# Patient Record
Sex: Female | Born: 1990 | Race: Black or African American | Hispanic: No | Marital: Single | Smoking: Former smoker
Health system: Southern US, Community
[De-identification: ages and names within clinical notes are randomized; demographics above are authoritative.]

## PROBLEM LIST (undated history)

## (undated) ENCOUNTER — Inpatient Hospital Stay (HOSPITAL_COMMUNITY): Payer: Self-pay

## (undated) ENCOUNTER — Inpatient Hospital Stay: Payer: Self-pay

## (undated) DIAGNOSIS — F329 Major depressive disorder, single episode, unspecified: Secondary | ICD-10-CM

## (undated) DIAGNOSIS — L309 Dermatitis, unspecified: Secondary | ICD-10-CM

## (undated) DIAGNOSIS — F32A Depression, unspecified: Secondary | ICD-10-CM

## (undated) DIAGNOSIS — F419 Anxiety disorder, unspecified: Secondary | ICD-10-CM

## (undated) DIAGNOSIS — J45909 Unspecified asthma, uncomplicated: Secondary | ICD-10-CM

## (undated) DIAGNOSIS — B977 Papillomavirus as the cause of diseases classified elsewhere: Secondary | ICD-10-CM

## (undated) HISTORY — DX: Papillomavirus as the cause of diseases classified elsewhere: B97.7

## (undated) HISTORY — PX: NO PAST SURGERIES: SHX2092

---
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
## 2008-02-17 ENCOUNTER — Emergency Department (HOSPITAL_COMMUNITY): Admission: EM | Admit: 2008-02-17 | Discharge: 2008-02-17 | Payer: Self-pay | Admitting: Emergency Medicine

## 2008-12-05 ENCOUNTER — Emergency Department (HOSPITAL_COMMUNITY): Admission: EM | Admit: 2008-12-05 | Discharge: 2008-12-05 | Payer: Self-pay | Admitting: Emergency Medicine

## 2009-01-20 ENCOUNTER — Emergency Department (HOSPITAL_COMMUNITY): Admission: EM | Admit: 2009-01-20 | Discharge: 2009-01-20 | Payer: Self-pay | Admitting: Pulmonary Disease

## 2009-03-10 ENCOUNTER — Ambulatory Visit: Payer: Self-pay | Admitting: Obstetrics & Gynecology

## 2009-03-15 ENCOUNTER — Ambulatory Visit: Payer: Self-pay | Admitting: Physician Assistant

## 2009-03-15 ENCOUNTER — Inpatient Hospital Stay (HOSPITAL_COMMUNITY): Admission: AD | Admit: 2009-03-15 | Discharge: 2009-03-15 | Payer: Self-pay | Admitting: Obstetrics & Gynecology

## 2009-03-17 ENCOUNTER — Ambulatory Visit: Payer: Self-pay | Admitting: Family Medicine

## 2009-03-17 ENCOUNTER — Ambulatory Visit (HOSPITAL_COMMUNITY): Admission: RE | Admit: 2009-03-17 | Discharge: 2009-03-17 | Payer: Self-pay | Admitting: Family Medicine

## 2009-03-31 ENCOUNTER — Ambulatory Visit: Payer: Self-pay | Admitting: Obstetrics and Gynecology

## 2009-04-21 ENCOUNTER — Ambulatory Visit: Payer: Self-pay | Admitting: Obstetrics & Gynecology

## 2009-04-21 ENCOUNTER — Ambulatory Visit (HOSPITAL_COMMUNITY): Admission: RE | Admit: 2009-04-21 | Discharge: 2009-04-21 | Payer: Self-pay | Admitting: Family Medicine

## 2009-04-28 ENCOUNTER — Ambulatory Visit: Payer: Self-pay | Admitting: Obstetrics & Gynecology

## 2009-04-29 ENCOUNTER — Ambulatory Visit (HOSPITAL_COMMUNITY): Admission: RE | Admit: 2009-04-29 | Discharge: 2009-04-29 | Payer: Self-pay | Admitting: Obstetrics and Gynecology

## 2009-05-05 ENCOUNTER — Ambulatory Visit: Payer: Self-pay | Admitting: Obstetrics and Gynecology

## 2009-05-12 ENCOUNTER — Ambulatory Visit: Payer: Self-pay | Admitting: Obstetrics & Gynecology

## 2009-05-20 ENCOUNTER — Ambulatory Visit: Payer: Self-pay | Admitting: Obstetrics & Gynecology

## 2009-05-26 ENCOUNTER — Encounter: Payer: Self-pay | Admitting: Obstetrics & Gynecology

## 2009-05-26 ENCOUNTER — Ambulatory Visit: Payer: Self-pay | Admitting: Obstetrics & Gynecology

## 2009-06-02 ENCOUNTER — Ambulatory Visit: Payer: Self-pay | Admitting: Obstetrics & Gynecology

## 2009-06-08 ENCOUNTER — Ambulatory Visit: Payer: Self-pay | Admitting: Obstetrics & Gynecology

## 2009-06-14 ENCOUNTER — Ambulatory Visit (HOSPITAL_COMMUNITY): Admission: RE | Admit: 2009-06-14 | Discharge: 2009-06-14 | Payer: Self-pay | Admitting: Obstetrics and Gynecology

## 2009-06-15 ENCOUNTER — Ambulatory Visit: Payer: Self-pay | Admitting: Obstetrics and Gynecology

## 2009-06-23 ENCOUNTER — Ambulatory Visit: Payer: Self-pay | Admitting: Family Medicine

## 2009-06-30 ENCOUNTER — Ambulatory Visit: Payer: Self-pay | Admitting: Obstetrics & Gynecology

## 2009-07-07 ENCOUNTER — Ambulatory Visit: Payer: Self-pay | Admitting: Obstetrics & Gynecology

## 2009-07-07 ENCOUNTER — Encounter (INDEPENDENT_AMBULATORY_CARE_PROVIDER_SITE_OTHER): Payer: Self-pay | Admitting: *Deleted

## 2009-07-08 LAB — CONVERTED CEMR LAB
MCHC: 34.1 g/dL (ref 30.0–36.0)
RBC: 3.45 M/uL — ABNORMAL LOW (ref 3.87–5.11)
RDW: 13.6 % (ref 11.5–15.5)

## 2009-07-14 ENCOUNTER — Ambulatory Visit: Payer: Self-pay | Admitting: Obstetrics & Gynecology

## 2009-07-21 ENCOUNTER — Ambulatory Visit: Payer: Self-pay | Admitting: Obstetrics & Gynecology

## 2009-07-28 ENCOUNTER — Ambulatory Visit: Payer: Self-pay | Admitting: Family Medicine

## 2009-08-04 ENCOUNTER — Ambulatory Visit: Payer: Self-pay | Admitting: Obstetrics & Gynecology

## 2009-08-11 ENCOUNTER — Ambulatory Visit: Payer: Self-pay | Admitting: Obstetrics & Gynecology

## 2009-08-18 ENCOUNTER — Ambulatory Visit: Payer: Self-pay | Admitting: Vascular Surgery

## 2009-08-18 ENCOUNTER — Ambulatory Visit: Admission: RE | Admit: 2009-08-18 | Discharge: 2009-08-18 | Payer: Self-pay | Admitting: Obstetrics and Gynecology

## 2009-08-18 ENCOUNTER — Encounter (INDEPENDENT_AMBULATORY_CARE_PROVIDER_SITE_OTHER): Payer: Self-pay | Admitting: *Deleted

## 2009-08-18 ENCOUNTER — Ambulatory Visit: Payer: Self-pay | Admitting: Obstetrics and Gynecology

## 2009-08-18 ENCOUNTER — Encounter: Payer: Self-pay | Admitting: Obstetrics and Gynecology

## 2009-08-25 ENCOUNTER — Encounter (INDEPENDENT_AMBULATORY_CARE_PROVIDER_SITE_OTHER): Payer: Self-pay | Admitting: *Deleted

## 2009-08-25 ENCOUNTER — Ambulatory Visit: Payer: Self-pay | Admitting: Family Medicine

## 2009-09-01 ENCOUNTER — Ambulatory Visit: Payer: Self-pay | Admitting: Family Medicine

## 2009-09-06 ENCOUNTER — Ambulatory Visit: Payer: Self-pay | Admitting: Advanced Practice Midwife

## 2009-09-06 ENCOUNTER — Inpatient Hospital Stay (HOSPITAL_COMMUNITY): Admission: AD | Admit: 2009-09-06 | Discharge: 2009-09-06 | Payer: Self-pay | Admitting: Obstetrics and Gynecology

## 2009-09-08 ENCOUNTER — Ambulatory Visit: Payer: Self-pay | Admitting: Obstetrics & Gynecology

## 2009-09-08 ENCOUNTER — Encounter (INDEPENDENT_AMBULATORY_CARE_PROVIDER_SITE_OTHER): Payer: Self-pay | Admitting: *Deleted

## 2009-09-11 ENCOUNTER — Ambulatory Visit: Payer: Self-pay | Admitting: Family Medicine

## 2009-09-11 ENCOUNTER — Inpatient Hospital Stay (HOSPITAL_COMMUNITY): Admission: AD | Admit: 2009-09-11 | Discharge: 2009-09-11 | Payer: Self-pay | Admitting: Obstetrics & Gynecology

## 2009-09-13 ENCOUNTER — Inpatient Hospital Stay (HOSPITAL_COMMUNITY): Admission: AD | Admit: 2009-09-13 | Discharge: 2009-09-13 | Payer: Self-pay | Admitting: Obstetrics & Gynecology

## 2009-09-13 ENCOUNTER — Ambulatory Visit: Payer: Self-pay | Admitting: Advanced Practice Midwife

## 2009-09-15 ENCOUNTER — Encounter (INDEPENDENT_AMBULATORY_CARE_PROVIDER_SITE_OTHER): Payer: Self-pay | Admitting: *Deleted

## 2009-09-15 ENCOUNTER — Ambulatory Visit: Payer: Self-pay | Admitting: Obstetrics and Gynecology

## 2009-09-17 ENCOUNTER — Ambulatory Visit: Payer: Self-pay | Admitting: Advanced Practice Midwife

## 2009-10-24 ENCOUNTER — Ambulatory Visit: Payer: Self-pay | Admitting: Obstetrics and Gynecology

## 2010-01-19 ENCOUNTER — Inpatient Hospital Stay (HOSPITAL_COMMUNITY): Admission: AD | Admit: 2010-01-19 | Discharge: 2009-09-19 | Payer: Self-pay | Admitting: Obstetrics & Gynecology

## 2010-03-23 ENCOUNTER — Ambulatory Visit: Payer: Self-pay | Admitting: Family Medicine

## 2010-04-28 LAB — CBC
MCH: 32.5 pg (ref 26.0–34.0)
MCHC: 33.2 g/dL (ref 30.0–36.0)
Platelets: 226 10*3/uL (ref 150–400)
RBC: 2.77 MIL/uL — ABNORMAL LOW (ref 3.87–5.11)
RDW: 14.6 % (ref 11.5–15.5)

## 2010-04-28 LAB — POCT URINALYSIS DIPSTICK
Bilirubin Urine: NEGATIVE
Glucose, UA: NEGATIVE mg/dL
Nitrite: NEGATIVE
Protein, ur: NEGATIVE mg/dL
Urobilinogen, UA: 1 mg/dL (ref 0.0–1.0)

## 2010-04-29 LAB — POCT URINALYSIS DIP (DEVICE)
Bilirubin Urine: NEGATIVE
Bilirubin Urine: NEGATIVE
Glucose, UA: NEGATIVE mg/dL
Glucose, UA: NEGATIVE mg/dL
Hgb urine dipstick: NEGATIVE
Hgb urine dipstick: NEGATIVE
Ketones, ur: NEGATIVE mg/dL
Ketones, ur: NEGATIVE mg/dL
Nitrite: NEGATIVE
Nitrite: NEGATIVE
Protein, ur: 30 mg/dL — AB
Protein, ur: NEGATIVE mg/dL
Specific Gravity, Urine: 1.02 (ref 1.005–1.030)
Specific Gravity, Urine: 1.025 (ref 1.005–1.030)
Urobilinogen, UA: 1 mg/dL (ref 0.0–1.0)
Urobilinogen, UA: 2 mg/dL — ABNORMAL HIGH (ref 0.0–1.0)
pH: 7 (ref 5.0–8.0)
pH: 7 (ref 5.0–8.0)

## 2010-04-30 LAB — POCT URINALYSIS DIP (DEVICE)
Glucose, UA: NEGATIVE mg/dL
Glucose, UA: NEGATIVE mg/dL
Hgb urine dipstick: NEGATIVE
Hgb urine dipstick: NEGATIVE
Ketones, ur: 15 mg/dL — AB
Ketones, ur: NEGATIVE mg/dL
Ketones, ur: 15 mg/dL — AB
Nitrite: NEGATIVE
Protein, ur: 30 mg/dL — AB
Protein, ur: 30 mg/dL — AB
Protein, ur: 30 mg/dL — AB
Specific Gravity, Urine: 1.02 (ref 1.005–1.030)
Specific Gravity, Urine: >=1.03 (ref 1.005–1.030)
Specific Gravity, Urine: 1.025 (ref 1.005–1.030)
Urobilinogen, UA: 1 mg/dL (ref 0.0–1.0)
Urobilinogen, UA: 2 mg/dL — ABNORMAL HIGH (ref 0.0–1.0)
Urobilinogen, UA: 1 mg/dL (ref 0.0–1.0)
pH: 6 (ref 5.0–8.0)
pH: 6.5 (ref 5.0–8.0)
pH: 6.5 (ref 5.0–8.0)

## 2010-05-01 LAB — POCT URINALYSIS DIP (DEVICE)
Bilirubin Urine: NEGATIVE
Glucose, UA: NEGATIVE mg/dL
Hgb urine dipstick: NEGATIVE
Hgb urine dipstick: NEGATIVE
Ketones, ur: NEGATIVE mg/dL
Protein, ur: 100 mg/dL — AB
Specific Gravity, Urine: >=1.03 (ref 1.005–1.030)
Specific Gravity, Urine: >=1.03 (ref 1.005–1.030)
Urobilinogen, UA: 1 mg/dL (ref 0.0–1.0)
Urobilinogen, UA: 0.2 mg/dL (ref 0.0–1.0)
pH: 6.5 (ref 5.0–8.0)

## 2010-05-02 LAB — POCT URINALYSIS DIP (DEVICE)
Bilirubin Urine: NEGATIVE
Hgb urine dipstick: NEGATIVE
Hgb urine dipstick: NEGATIVE
Ketones, ur: NEGATIVE mg/dL
Nitrite: NEGATIVE
Protein, ur: NEGATIVE mg/dL
Protein, ur: 100 mg/dL — AB
Protein, ur: 30 mg/dL — AB
Specific Gravity, Urine: >=1.03 (ref 1.005–1.030)
Specific Gravity, Urine: >=1.03 (ref 1.005–1.030)
Urobilinogen, UA: 0.2 mg/dL (ref 0.0–1.0)
Urobilinogen, UA: 1 mg/dL (ref 0.0–1.0)
pH: 7 (ref 5.0–8.0)
pH: 5.5 (ref 5.0–8.0)
pH: 6 (ref 5.0–8.0)

## 2010-05-03 LAB — URINALYSIS, ROUTINE W REFLEX MICROSCOPIC
Bilirubin Urine: NEGATIVE
Hgb urine dipstick: NEGATIVE
Ketones, ur: NEGATIVE mg/dL
Protein, ur: NEGATIVE mg/dL
Specific Gravity, Urine: >1.03 — ABNORMAL HIGH (ref 1.005–1.030)
Urobilinogen, UA: 0.2 mg/dL (ref 0.0–1.0)

## 2010-05-03 LAB — POCT URINALYSIS DIP (DEVICE)
Bilirubin Urine: NEGATIVE
Glucose, UA: NEGATIVE mg/dL
Hgb urine dipstick: NEGATIVE
Hgb urine dipstick: NEGATIVE
Ketones, ur: NEGATIVE mg/dL
Ketones, ur: NEGATIVE mg/dL
Nitrite: NEGATIVE
Nitrite: NEGATIVE
Protein, ur: 30 mg/dL — AB
Protein, ur: NEGATIVE mg/dL
Specific Gravity, Urine: 1.025 (ref 1.005–1.030)
Urobilinogen, UA: 1 mg/dL (ref 0.0–1.0)
Urobilinogen, UA: 0.2 mg/dL (ref 0.0–1.0)
pH: 6 (ref 5.0–8.0)
pH: 6 (ref 5.0–8.0)

## 2010-05-03 LAB — WET PREP, GENITAL: Trich, Wet Prep: NONE SEEN

## 2010-05-07 LAB — POCT URINALYSIS DIP (DEVICE)
Bilirubin Urine: NEGATIVE
Glucose, UA: NEGATIVE mg/dL
Hgb urine dipstick: NEGATIVE
Hgb urine dipstick: NEGATIVE
Nitrite: NEGATIVE
Nitrite: NEGATIVE
Protein, ur: 30 mg/dL — AB
Protein, ur: NEGATIVE mg/dL
Specific Gravity, Urine: 1.025 (ref 1.005–1.030)
Urobilinogen, UA: 0.2 mg/dL (ref 0.0–1.0)
Urobilinogen, UA: 1 mg/dL (ref 0.0–1.0)
pH: 7 (ref 5.0–8.0)

## 2010-05-16 LAB — CBC
HCT: 37.4 % (ref 36.0–46.0)
MCV: 95.9 fL (ref 78.0–100.0)
Platelets: 185 10*3/uL (ref 150–400)
RBC: 3.9 MIL/uL (ref 3.87–5.11)
WBC: 3.9 10*3/uL — ABNORMAL LOW (ref 4.0–10.5)

## 2010-05-16 LAB — URINE MICROSCOPIC-ADD ON

## 2010-05-16 LAB — DIFFERENTIAL
Basophils Absolute: 0 10*3/uL (ref 0.0–0.1)
Eosinophils Absolute: 0.1 10*3/uL (ref 0.0–0.7)
Lymphs Abs: 1.4 10*3/uL (ref 0.7–4.0)
Monocytes Absolute: 0.5 10*3/uL (ref 0.1–1.0)
Neutro Abs: 1.9 10*3/uL (ref 1.7–7.7)

## 2010-05-16 LAB — URINALYSIS, ROUTINE W REFLEX MICROSCOPIC
Bilirubin Urine: NEGATIVE
Hgb urine dipstick: NEGATIVE
Ketones, ur: 15 mg/dL — AB
Nitrite: NEGATIVE
Protein, ur: 30 mg/dL — AB
Urobilinogen, UA: 0.2 mg/dL (ref 0.0–1.0)

## 2010-05-16 LAB — COMPREHENSIVE METABOLIC PANEL
ALT: 14 U/L (ref 0–35)
AST: 37 U/L (ref 0–37)
Albumin: 3.6 g/dL (ref 3.5–5.2)
Alkaline Phosphatase: 34 U/L — ABNORMAL LOW (ref 39–117)
BUN: 9 mg/dL (ref 6–23)
CO2: 18 mEq/L — ABNORMAL LOW (ref 19–32)
Calcium: 8.7 mg/dL (ref 8.4–10.5)
Chloride: 103 mEq/L (ref 96–112)
Creatinine, Ser: 0.58 mg/dL (ref 0.4–1.2)
GFR calc Af Amer: >60 mL/min (ref >60)
GFR calc non Af Amer: >60 mL/min (ref >60)
Glucose, Bld: 80 mg/dL (ref 70–99)
Potassium: 4.7 mEq/L (ref 3.5–5.1)
Sodium: 130 mEq/L — ABNORMAL LOW (ref 135–145)
Total Bilirubin: 1 mg/dL (ref 0.3–1.2)
Total Protein: 7.1 g/dL (ref 6.0–8.3)

## 2010-05-16 LAB — HCG, QUANTITATIVE, PREGNANCY: hCG, Beta Chain, Quant, S: 25932 m[IU]/mL — ABNORMAL HIGH (ref <5)

## 2010-05-16 LAB — POCT PREGNANCY, URINE: Preg Test, Ur: POSITIVE

## 2010-06-01 ENCOUNTER — Emergency Department (HOSPITAL_COMMUNITY)
Admission: EM | Admit: 2010-06-01 | Discharge: 2010-06-01 | Disposition: A | Discharge status: Home / Self Care | Payer: Medicaid Other | Attending: Emergency Medicine | Admitting: Emergency Medicine

## 2010-06-01 DIAGNOSIS — J45909 Unspecified asthma, uncomplicated: Secondary | ICD-10-CM | POA: Insufficient documentation

## 2010-06-01 DIAGNOSIS — R21 Rash and other nonspecific skin eruption: Secondary | ICD-10-CM | POA: Insufficient documentation

## 2010-06-01 DIAGNOSIS — L259 Unspecified contact dermatitis, unspecified cause: Secondary | ICD-10-CM | POA: Insufficient documentation

## 2010-06-27 NOTE — Discharge Summary (Signed)
NAMEBEATRIX, BREECE             ACCOUNT NO.:  [id number]   MEDICAL RECORD NO.:  [id number]         PATIENT TYPE:  WINP   LOCATION:  WOC                           FACILITY:  [facility]   PHYSICIAN:  Lesly Dukes, M.D. DATE OF BIRTH:  07/23/1990   DATE OF ADMISSION:  DATE OF DISCHARGE:  11/20/2007                               DISCHARGE SUMMARY   DISCHARGE DIAGNOSES:  1. At 31-1/7th week intrauterine pregnancy.  2. Primigravida.  3. Idiopathic/unexplained polyhydramnios.  4. Preterm labor.   REASON FOR ADMISSION:  This is an 20 year old G1 African American female  well dated at 30-5/7th weeks at time of presentation to MAU for  contractions.  She was known to have mild polyhydramnios and of note,  had had a MFM ultrasound previously showing normal anatomy.  In MAU, she  was given terbutaline and IVP with fluids and admitted when she  continued to have contractions.  Her cervix was then felt to be  fingertip, 30%, and -2.   HOSPITAL COURSE:  She was admitted and begun on magnesium sulfate.  She  was given betamethasone for enhancement of fetal lung maturity.  She was  also treated with Ancef.  On hospital day #1 in consultation with Dr.  Margot Ables and Dr. Penne Lash, she was also begun on indomethacin for 48 hours  as well as K-Dur due to an incidental finding of low potassium of 2.8,  which subsequently was normal at 3.6.  After about 24 hours, she was  stopped from magnesium and begun on Procardia.  Her vitals remained  stable as well as her pulse ox.  Fetal heart was in the 140s with  moderate variability and accelerations.  She was having irregular  contractions which diminished to uterine irritability.  On day #2, she  had an ultrasound with MFM and AFI was found to be 29.5.  Cervix was  dynamic with final length 3.7, final width 1.6.  Dr. Ander Slade is plan to do  serial ultrasounds.  Labs, hemoglobin 10.6.  Potassium as above.  UDS  negative.  Urine culture negative.  Negative GC,  negative Chlamydia, and  negative beta strep.  On day of discharge, her cervix was felt to be  very long and soft.  One at external os with some funneling perceived.  She was felt to be in stable condition on discharge in consultation with  the team.  Medications were Procardia 30 mg XL 1 p.o. q.12 h. and PNV 1  per day.  Her instructions included rest as much as possible and pelvic  rest.  She is given a note to be out of school in order to accomplish  this, and she was to follow up in High-Risk clinic in 1 week.      Deirdre Christy Gentles, C.N.M.      Lesly Dukes, M.D.  Electronically Signed    DP/MEDQ  D:  12/17/2007  T:  12/18/2007  Job:  [id number]

## 2010-09-14 ENCOUNTER — Emergency Department (HOSPITAL_COMMUNITY)
Admission: EM | Admit: 2010-09-14 | Discharge: 2010-09-14 | Disposition: A | Discharge status: Home / Self Care | Payer: Medicaid Other | Attending: Emergency Medicine | Admitting: Emergency Medicine

## 2010-09-14 DIAGNOSIS — H5710 Ocular pain, unspecified eye: Secondary | ICD-10-CM | POA: Insufficient documentation

## 2010-09-14 DIAGNOSIS — H5789 Other specified disorders of eye and adnexa: Secondary | ICD-10-CM | POA: Insufficient documentation

## 2010-09-14 DIAGNOSIS — H53149 Visual discomfort, unspecified: Secondary | ICD-10-CM | POA: Insufficient documentation

## 2010-09-14 DIAGNOSIS — H109 Unspecified conjunctivitis: Secondary | ICD-10-CM | POA: Insufficient documentation

## 2010-09-24 ENCOUNTER — Emergency Department (HOSPITAL_COMMUNITY)
Admission: EM | Admit: 2010-09-24 | Discharge: 2010-09-24 | Discharge status: Against Medical Advice | Payer: Medicaid Other | Attending: Emergency Medicine | Admitting: Emergency Medicine

## 2010-09-24 DIAGNOSIS — Z046 Encounter for general psychiatric examination, requested by authority: Secondary | ICD-10-CM | POA: Insufficient documentation

## 2010-10-19 ENCOUNTER — Ambulatory Visit: Payer: Self-pay | Admitting: Obstetrics and Gynecology

## 2010-11-13 LAB — RAPID URINE DRUG SCREEN, HOSP PERFORMED
Barbiturates: NOT DETECTED
Benzodiazepines: NOT DETECTED
Cocaine: NOT DETECTED

## 2010-11-13 LAB — DIFFERENTIAL
Band Neutrophils: 3
Blasts: 0
Lymphocytes Relative: 23
Metamyelocytes Relative: 0
Promyelocytes Absolute: 0

## 2010-11-13 LAB — BASIC METABOLIC PANEL
CO2: 22
Calcium: 6.7 — ABNORMAL LOW
Creatinine, Ser: 0.57
Glucose, Bld: 118 — ABNORMAL HIGH

## 2010-11-13 LAB — URINE MICROSCOPIC-ADD ON

## 2010-11-13 LAB — GC/CHLAMYDIA PROBE AMP, GENITAL
Chlamydia, DNA Probe: NEGATIVE
GC Probe Amp, Genital: NEGATIVE

## 2010-11-13 LAB — COMPREHENSIVE METABOLIC PANEL
AST: 20
Albumin: 2.3 — ABNORMAL LOW
Alkaline Phosphatase: 103
BUN: 6
Chloride: 102
Potassium: 2.8 — ABNORMAL LOW
Total Bilirubin: 0.4

## 2010-11-13 LAB — CBC
HCT: 31 — ABNORMAL LOW
Hemoglobin: 11.8 — ABNORMAL LOW
MCHC: 33.7
Platelets: 222
RBC: 3.15 — ABNORMAL LOW
RDW: 13.5
WBC: 9.9

## 2010-11-13 LAB — STREP B DNA PROBE

## 2010-11-13 LAB — FETAL FIBRONECTIN: Fetal Fibronectin: POSITIVE

## 2010-11-13 LAB — RPR: RPR Ser Ql: NONREACTIVE

## 2010-11-13 LAB — URINALYSIS, ROUTINE W REFLEX MICROSCOPIC
Bilirubin Urine: NEGATIVE
Glucose, UA: NEGATIVE
Ketones, ur: NEGATIVE
Protein, ur: NEGATIVE

## 2010-11-13 LAB — WET PREP, GENITAL: Trich, Wet Prep: NONE SEEN

## 2010-11-13 LAB — RUBELLA SCREEN: Rubella: 4.9

## 2010-11-13 LAB — URINE CULTURE

## 2010-11-24 LAB — URINALYSIS, ROUTINE W REFLEX MICROSCOPIC
Bilirubin Urine: NEGATIVE
Specific Gravity, Urine: 1.015
Urobilinogen, UA: 0.2

## 2010-11-24 LAB — COMPREHENSIVE METABOLIC PANEL
ALT: 9
Alkaline Phosphatase: 42
CO2: 21
Calcium: 9
Chloride: 108
GFR calc non Af Amer: >60
Glucose, Bld: 94
Potassium: 3.4 — ABNORMAL LOW
Sodium: 136
Total Bilirubin: 0.6

## 2010-11-24 LAB — DIFFERENTIAL
Basophils Absolute: 0
Basophils Relative: 0
Eosinophils Relative: 1
Monocytes Absolute: 0.5
Neutro Abs: 5.1

## 2010-11-24 LAB — URINE MICROSCOPIC-ADD ON

## 2010-11-24 LAB — CBC
Hemoglobin: 11.5 — ABNORMAL LOW
MCHC: 34.5
RBC: 3.65 — ABNORMAL LOW
WBC: 7.1

## 2010-12-12 ENCOUNTER — Emergency Department (HOSPITAL_COMMUNITY)
Admission: EM | Admit: 2010-12-12 | Discharge: 2010-12-12 | Disposition: A | Discharge status: Home / Self Care | Payer: Medicaid Other | Attending: Emergency Medicine | Admitting: Emergency Medicine

## 2010-12-12 DIAGNOSIS — Z3201 Encounter for pregnancy test, result positive: Secondary | ICD-10-CM | POA: Insufficient documentation

## 2010-12-12 LAB — POCT PREGNANCY, URINE: Preg Test, Ur: POSITIVE

## 2010-12-17 ENCOUNTER — Emergency Department (HOSPITAL_COMMUNITY)
Admission: EM | Admit: 2010-12-17 | Discharge: 2010-12-17 | Disposition: A | Discharge status: Home / Self Care | Payer: Medicaid Other | Attending: Emergency Medicine | Admitting: Emergency Medicine

## 2010-12-17 ENCOUNTER — Other Ambulatory Visit: Payer: Self-pay

## 2010-12-17 ENCOUNTER — Emergency Department (HOSPITAL_COMMUNITY): Payer: Medicaid Other

## 2010-12-17 ENCOUNTER — Encounter: Payer: Self-pay | Admitting: *Deleted

## 2010-12-17 DIAGNOSIS — R55 Syncope and collapse: Secondary | ICD-10-CM | POA: Insufficient documentation

## 2010-12-17 DIAGNOSIS — H547 Unspecified visual loss: Secondary | ICD-10-CM | POA: Insufficient documentation

## 2010-12-17 DIAGNOSIS — O9989 Other specified diseases and conditions complicating pregnancy, childbirth and the puerperium: Secondary | ICD-10-CM | POA: Insufficient documentation

## 2010-12-17 DIAGNOSIS — R110 Nausea: Secondary | ICD-10-CM | POA: Insufficient documentation

## 2010-12-17 DIAGNOSIS — R002 Palpitations: Secondary | ICD-10-CM | POA: Insufficient documentation

## 2010-12-17 NOTE — ED Notes (Signed)
New ekg given to dr king. No old ekg

## 2010-12-17 NOTE — ED Provider Notes (Signed)
History     CSN: [id number] Arrival date & time: 12/17/2010  6:08 PM   First MD Initiated Contact with Patient 12/17/10 1830      Chief Complaint  Patient presents with  . Near Syncope    (Consider location/radiation/quality/duration/timing/severity/associated sxs/prior treatment) Patient is a 20 y.o. female presenting with general illness. The history is provided by the patient.  Illness  The current episode started today. The onset was sudden. The problem has been resolved. The problem is moderate. The symptoms are relieved by nothing. The symptoms are aggravated by nothing. Associated symptoms include decreased vision. Pertinent negatives include no fever, no double vision, no abdominal pain, no diarrhea, no nausea, no vomiting, no congestion, no ear pain, no headaches, no hearing loss, no rhinorrhea, no sore throat, no muscle aches, no neck pain, no cough, no rash, no diaper rash and no eye pain. She has been behaving normally.    History reviewed. No pertinent past medical history.  History reviewed. No pertinent past surgical history.  History reviewed. No pertinent family history.  History  Substance Use Topics  . Smoking status: Not on file  . Smokeless tobacco: Not on file  . Alcohol Use: Not on file    OB History    Grav Para Term Preterm Abortions TAB SAB Ect Mult Living   1               Review of Systems  Constitutional: Negative for fever and activity change.  HENT: Negative for hearing loss, ear pain, congestion, sore throat, rhinorrhea and neck pain.   Eyes: Negative for double vision and pain.  Respiratory: Negative for cough and shortness of breath.   Cardiovascular: Negative for chest pain.  Gastrointestinal: Negative for nausea, vomiting, abdominal pain and diarrhea.  Genitourinary: Negative for dysuria, decreased urine volume and vaginal bleeding.  Skin: Negative for rash.  Neurological: Negative for headaches.  Psychiatric/Behavioral: Negative for  confusion.  All other systems reviewed and are negative.    Allergies  Penicillins  Home Medications  No current outpatient prescriptions on file.  BP 92/48  Pulse 68  Temp(Src) 98.4 F (36.9 C) (Oral)  SpO2 100%  Physical Exam  Constitutional: She is oriented to person, place, and time. She appears well-developed and well-nourished.  HENT:  Head: Normocephalic and atraumatic.  Eyes: Conjunctivae are normal. Pupils are equal, round, and reactive to light.  Neck: Normal range of motion. Neck supple.  Cardiovascular: Normal rate and regular rhythm.   Pulmonary/Chest: Effort normal and breath sounds normal.  Abdominal: Soft. There is no tenderness.  Musculoskeletal: Normal range of motion. She exhibits no edema.  Neurological: She is alert and oriented to person, place, and time. No cranial nerve deficit.  Skin: Skin is warm and dry. No erythema.    ED Course  Procedures (including critical care time)  Date: 12/17/2010  Rate: 77  Rhythm: normal sinus rhythm  QRS Axis: normal  Intervals: normal  ST/T Wave abnormalities: normal  Conduction Disutrbances:none  Narrative Interpretation:   Old EKG Reviewed: none available    Labs Reviewed  GLUCOSE, CAPILLARY  POCT CBG MONITORING   US Ob Comp Less 14 Wks  12/17/2010  *RADIOLOGY REPORT*  Clinical Data: Nausea.  Near-syncope.  OBSTETRIC <14 WK [place] AND TRANSVAGINAL OB US  Technique:  Both transabdominal and transvaginal ultrasound examinations were performed for complete evaluation of the gestation as well as the maternal uterus, adnexal regions, and pelvic cul-de-sac.  Transvaginal technique was performed to assess early pregnancy.  Comparison:  None.  Intrauterine gestational sac:  Present.  Fundal. Yolk sac: Present Embryo: Present Cardiac Activity: Present Heart Rate: 100 bpm  MSD:   mm      w     d CRL: 2.7   mm  five    w  six   d         [place] EDC: 08/13/2011  Maternal uterus/adnexae: Small 5 x 10 mm subchorionic hemorrhage.   1.3 cm para ovarian cysts is adjacent to the left ovary.  Small amount of free fluid is nonspecific.  IMPRESSION: Single live intrauterine pregnancy with an estimated gestational age of [redacted] weeks and 6 days.  Fetal heart rate is 100 beats per minute.  Small 5 x 10 mm subchorionic hemorrhage.  Original Report Authenticated By: Donavan Burnet, M.D.     1. Near syncope   2. Pregnancy    MDM  Pt presented due to near syncope today.  She reports that she was standing in line when she because nauseated, felt like she was going to pass out.  Had palpitations, vision darkened.  Pt reports no PO intake today, she is pregnant per pt.  US done showed IUP.  Pt able to tolerate PO.  Told her ot f/u with OB in 2 days.  EKG neg.         Jacqulynn Cadet, MD Resident 12/17/10 2129

## 2010-12-17 NOTE — ED Notes (Deleted)
Patient had near syncopal episode around 5:20pm.  Patient at work, denies LOC, Denies, head/neck/back pain. Patient orthostatic changes in BP, CBG 66, last meal 3:00pm, approximately 2 mos. Pregnant.  Alert and oriented x 3 through the whole ride to hospital

## 2010-12-17 NOTE — ED Notes (Signed)
Patient had near syncopal episode around 5:20pm.  Patient at work, denies LOC, Denies, head/neck/back pain. Patient orthostatic changes in BP, CBG 66, last meal 3:00pm, approximately 2 mos. Pregnant.  Alert and oriented x 3 through the whole ride to hospital 

## 2010-12-18 NOTE — ED Provider Notes (Signed)
Medical screening examination/treatment/procedure(s) were conducted as a shared visit with non-physician practitioner(s) and myself.  I personally evaluated the patient during the encounter  Patient with positive pregnancy. Near syncopal episodes while standing. She is no dominant pain or vaginal symptoms. Has had no prenatal care up to this point. Heart no chest pain, shortness of breath. No headache. Not have a full syncopal episode. Exam is unremarkable.  Sent for formal ultrasound. No evidence of ectopic will be discharged home  Dayton Bailiff, MD 12/18/10 0020

## 2010-12-19 ENCOUNTER — Telehealth: Payer: Self-pay | Admitting: *Deleted

## 2010-12-19 NOTE — Telephone Encounter (Signed)
Pt left message stating that she was seen on 11/4 @ [facility] ED because she passed out @ work. She states she is [redacted] wks pregnant and was told to have follow up in 2 days. She has prenatal visit appt on 12/27/10. I called pt and informed her that she does not need to be seen @ this time. She should  keep appt as scheduled @ [facility] on 11/14. I have reviewed the notes from her visit to the ED and assured her that all her needs were addressed @ the time. Her [place] was normal. I also instructed pt to come to MAU if she should have any future problems during the pregnancy. Pt voiced understanding.

## 2011-01-28 ENCOUNTER — Encounter (HOSPITAL_COMMUNITY): Payer: Self-pay | Admitting: Obstetrics and Gynecology

## 2011-01-28 ENCOUNTER — Inpatient Hospital Stay (HOSPITAL_COMMUNITY)
Admission: AD | Admit: 2011-01-28 | Discharge: 2011-01-28 | Disposition: A | Discharge status: Home / Self Care | Payer: Medicaid Other | Source: Ambulatory Visit | Attending: Family Medicine | Admitting: Family Medicine

## 2011-01-28 DIAGNOSIS — O99891 Other specified diseases and conditions complicating pregnancy: Secondary | ICD-10-CM | POA: Insufficient documentation

## 2011-01-28 DIAGNOSIS — J069 Acute upper respiratory infection, unspecified: Secondary | ICD-10-CM

## 2011-01-28 DIAGNOSIS — E860 Dehydration: Secondary | ICD-10-CM

## 2011-01-28 DIAGNOSIS — R42 Dizziness and giddiness: Secondary | ICD-10-CM

## 2011-01-28 DIAGNOSIS — Z3480 Encounter for supervision of other normal pregnancy, unspecified trimester: Secondary | ICD-10-CM

## 2011-01-28 HISTORY — DX: Anxiety disorder, unspecified: F41.9

## 2011-01-28 HISTORY — DX: Depression, unspecified: F32.A

## 2011-01-28 HISTORY — DX: Major depressive disorder, single episode, unspecified: F32.9

## 2011-01-28 LAB — GLUCOSE, CAPILLARY: Glucose-Capillary: 101 mg/dL — ABNORMAL HIGH (ref 70–99)

## 2011-01-28 LAB — URINALYSIS, ROUTINE W REFLEX MICROSCOPIC
Glucose, UA: NEGATIVE mg/dL
pH: 6 (ref 5.0–8.0)

## 2011-01-28 LAB — CBC
MCH: 30.9 pg (ref 26.0–34.0)
MCHC: 34 g/dL (ref 30.0–36.0)
Platelets: 228 10*3/uL (ref 150–400)

## 2011-01-28 LAB — URINE MICROSCOPIC-ADD ON

## 2011-01-28 NOTE — ED Provider Notes (Signed)
History   Lindsey TRICARICO is a 20 y.o. year old G1P0 female at [redacted]w[redacted]d weeks gestation who presents to MAU reporting dizziness, cold Sx and inquiring about abortion options.   Chief Complaint  Patient presents with  . Dizziness  . Headache   HPI  OB History    Grav Para Term Preterm Abortions TAB SAB Ect Mult Living   1               No past medical history on file.  No past surgical history on file.  No family history on file.  History  Substance Use Topics  . Smoking status: Not on file  . Smokeless tobacco: Not on file  . Alcohol Use: Not on file    Allergies:  Allergies  Allergen Reactions  . Penicillins Rash    No prescriptions prior to admission    ROS Physical Exam   Blood pressure 120/50, pulse 82, temperature 98.7 F (37.1 C), temperature source Oral, resp. rate 18.  Physical Exam NAD, A&Ox4 Lungs: CTAB Heart: RRR FHTs +  MAU Course  Procedures   Assessment and Plan  Undesired pregnancy Viral URI  Given contact info for [facility] and [facility]. Discussed alternatives to abortion. Offered SW consult, declined. Comfort measures for URI Small frequent meals, increase fluids   Navraj Dreibelbis 01/28/2011, 3:15 PM

## 2011-01-28 NOTE — Progress Notes (Signed)
Pt presents to MAU with multiple complaints. Pt says she doesn't feel right "feels like I have a cold". Cough, dizziness, headache. No complaints of fever. Pt is [redacted]w[redacted]d pregnant, G3P2. Pt has not started prenatal care; asks for assistance in how to apply to medicaid. Pt is unsure on whether or not she is going to keep the baby, has inquired about abortion.

## 2011-01-30 ENCOUNTER — Inpatient Hospital Stay (HOSPITAL_COMMUNITY)
Admission: AD | Admit: 2011-01-30 | Discharge: 2011-01-30 | Disposition: A | Discharge status: Home / Self Care | Payer: Self-pay | Source: Ambulatory Visit | Attending: Obstetrics & Gynecology | Admitting: Obstetrics & Gynecology

## 2011-01-30 ENCOUNTER — Other Ambulatory Visit: Payer: Self-pay | Admitting: Obstetrics & Gynecology

## 2011-01-30 ENCOUNTER — Encounter (HOSPITAL_COMMUNITY): Payer: Self-pay | Admitting: *Deleted

## 2011-01-30 DIAGNOSIS — R109 Unspecified abdominal pain: Secondary | ICD-10-CM | POA: Insufficient documentation

## 2011-01-30 DIAGNOSIS — O039 Complete or unspecified spontaneous abortion without complication: Secondary | ICD-10-CM | POA: Insufficient documentation

## 2011-01-30 MED ORDER — BUTORPHANOL TARTRATE 2 MG/ML IJ SOLN
1.0000 mg | Freq: Once | INTRAMUSCULAR | Status: AC
  Administered 2011-01-30: 1 mg via INTRAMUSCULAR
  Filled 2011-01-30: qty 1

## 2011-01-30 MED ORDER — METHYLERGONOVINE MALEATE 0.2 MG/ML IJ SOLN
0.2000 mg | Freq: Once | INTRAMUSCULAR | Status: AC
  Administered 2011-01-30: 0.2 mg via INTRAMUSCULAR
  Filled 2011-01-30: qty 1

## 2011-01-30 MED ORDER — PROMETHAZINE HCL 25 MG/ML IJ SOLN
25.0000 mg | Freq: Once | INTRAMUSCULAR | Status: AC
  Administered 2011-01-30: 25 mg via INTRAMUSCULAR
  Filled 2011-01-30: qty 1

## 2011-01-30 MED ORDER — IBUPROFEN 800 MG PO TABS: 800.0000 mg | ORAL_TABLET | Freq: Three times a day (TID) | ORAL | Status: AC | PRN

## 2011-01-30 MED ORDER — METHYLERGONOVINE MALEATE 0.2 MG PO TABS: 0.2000 mg | ORAL_TABLET | Freq: Four times a day (QID) | ORAL | Status: DC

## 2011-01-30 NOTE — ED Notes (Signed)
Katherine Basset CNM performed bedside U/S.

## 2011-01-30 NOTE — ED Provider Notes (Signed)
Chief Complaint:  Abdominal Pain   Lindsey Velazquez is  20 y.o. [id number].  No LMP recorded. Patient is pregnant..  Her pregnancy status is positive.  She presents complaining of Abdominal Pain . Onset is described as sudden and has been present for  4 hours. Reports sudden onset lower abd cramping tonight at 8pm. Presented 2 days ago in  Obstetrical/Gynecological History: OB History    Grav Para Term Preterm Abortions TAB SAB Ect Mult Living   3 2 1 1  0 0 0 0 0 2      Past Medical History: Past Medical History  Diagnosis Date  . Anxiety   . Depression     Past Surgical History: Past Surgical History  Procedure Date  . No past surgeries     Family History: Family History  Problem Relation Age of Onset  . Diabetes Mother     Social History: History  Substance Use Topics  . Smoking status: Current Some Day Smoker    Types: Cigarettes  . Smokeless tobacco: Not on file  . Alcohol Use: No    Allergies:  Allergies  Allergen Reactions  . Penicillins Rash    No prescriptions prior to admission    Review of Systems - Negative except what has been reviewed in the HPI   Physical Exam   Blood pressure 111/79, pulse 115, temperature 99 F (37.2 C), resp. rate 18.  General: General appearance - alert, well appearing, and in no distress, oriented to person, place, and time and normal appearing weight Mental status - alert, oriented to person, place, and time, Uncomfortable appearing Abdomen - tenderness noted diffuse, moderate no rebound tenderness noted no bladder distension noted Focused Gynecological Exam: VULVA: pink discharge noted at introitus and on perineum, VAGINA: normal appearing vagina with normal color and discharge, no lesions, vaginal discharge - moderate pink, CERVIX: 1.5/70/? Palpation of gestational sac, UTERUS: enlarged to 12 week's size, non tender  Imaging Studies:  Doppler FHR 180 +Cardiac activity ~ 12 fetus, appears low in uterus   ED  Course: Undesired pregnancy. Discussed with pt threatened AB and expectant management. Patient resting comfortably after IM meds.   Prior to discharge pt called desk with c/o vaginal pressure. Upon exam fetal parts could be palpated in the vagina. Pt was instructed to push and spontaneous delivered a 12 week non viable fetus that appeared to be grossly normal female.Marland Kitchen Speculum was place in the vagina and ring forceps were used to remove the placenta from the cervical os. Placenta appeared intact. Bimanual exam revealed firm uterus. Bleeding WNL. EBL <300cc.    Assessment: Complete AB at 12 weeks  Plan: Discharge home Rx methergine x 24 hours, ibuprofen FU in Gyn Clinic in 4-6 weeks   Meko Masterson E. 01/30/2011,12:33 AM

## 2011-01-30 NOTE — Progress Notes (Signed)
Pt G3 P2 at 12.1wks having lower abd pain since 2100, denies vomiting or diarrhea.  Denies vag bleeding or discharge.

## 2011-01-30 NOTE — Progress Notes (Signed)
Fetus and placenta collected.

## 2011-02-01 ENCOUNTER — Encounter (HOSPITAL_COMMUNITY): Payer: Self-pay | Admitting: Obstetrics and Gynecology

## 2011-02-06 NOTE — ED Provider Notes (Signed)
Chart reviewed and agree with management and plan.  

## 2011-03-01 ENCOUNTER — Encounter: Payer: Self-pay | Admitting: Obstetrics and Gynecology

## 2011-05-12 ENCOUNTER — Encounter (HOSPITAL_COMMUNITY): Payer: Self-pay | Admitting: Emergency Medicine

## 2011-05-12 ENCOUNTER — Emergency Department (HOSPITAL_COMMUNITY)
Admission: EM | Admit: 2011-05-12 | Discharge: 2011-05-12 | Disposition: A | Discharge status: Home / Self Care | Payer: Medicaid Other | Source: Home / Self Care | Attending: Emergency Medicine | Admitting: Emergency Medicine

## 2011-05-12 DIAGNOSIS — L509 Urticaria, unspecified: Secondary | ICD-10-CM

## 2011-05-12 HISTORY — DX: Dermatitis, unspecified: L30.9

## 2011-05-12 MED ORDER — TRIAMCINOLONE ACETONIDE 0.1 % EX CREA: TOPICAL_CREAM | Freq: Two times a day (BID) | CUTANEOUS | Status: DC

## 2011-05-12 MED ORDER — HYDROXYZINE HCL 25 MG PO TABS: 25.0000 mg | ORAL_TABLET | Freq: Four times a day (QID) | ORAL | Status: AC

## 2011-05-12 NOTE — Discharge Instructions (Signed)
Discussed followup for skin Dr. and use his medicines and interim. Should call a company to inspect your house to diagnose if there is an infestation with any type of insect or tick para hives locations can be induced by insect bites.

## 2011-05-12 NOTE — ED Notes (Signed)
[facility] [address]

## 2011-05-12 NOTE — ED Notes (Signed)
Reports bumps on arms, red scattered distribution.  Patient reports bumps since December.  Mother reports child have bumps that were noticed this past week.  Patient reports itching.  Patient is focused on her apartment being a source.  Patient is difficult to follow in conversation

## 2011-05-12 NOTE — ED Provider Notes (Signed)
History     CSN: [id number]  Arrival date & time 05/12/11  1028   First MD Initiated Contact with Patient 05/12/11 1042      Chief Complaint  Patient presents with  . Rash    (Consider location/radiation/quality/duration/timing/severity/associated sxs/prior treatment) HPI Comments: Patient reports have been having bumps on both arms upper torso since December, itchy and spreading all over her body. It not improve since December she has been living with both of her kids and they have not developed a similar rash interval last week that she started noticing that they also have bumps on her skin which she describes she will take him to see their pediatrician's early this week. Patient mentioned that she has had some discussions with her landlord and she wants them to check department for bugs that she has seen some and she is uncertain about what they are.  She has been using a cream prescribed by her dermatologist but it's not working  Patient is a 21 y.o. female presenting with rash. The history is provided by the patient.  Rash  This is a chronic problem. The current episode started more than 1 week ago. The problem has been rapidly worsening. There has been no fever. The rash is present on the torso, trunk, right wrist, left arm and left hand. The pain is mild. Associated symptoms include itching and pain. Pertinent negatives include no blisters and no weeping. She has tried anti-itch cream for the symptoms. The treatment provided no relief.    Past Medical History  Diagnosis Date  . Anxiety   . Depression   . Eczema     Past Surgical History  Procedure Date  . No past surgeries     Family History  Problem Relation Age of Onset  . Diabetes Mother     History  Substance Use Topics  . Smoking status: Current Some Day Smoker    Types: Cigarettes  . Smokeless tobacco: Not on file  . Alcohol Use: Yes    OB History    Grav Para Term Preterm Abortions TAB SAB Ect Mult  Living   3 2 1 1  0 0 0 0 0 2      Review of Systems  Constitutional: Negative for fever, chills, appetite change and fatigue.  Skin: Positive for itching and rash. Negative for color change and wound.    Allergies  Penicillins  Home Medications   Current Outpatient Rx  Name Route Sig Dispense Refill  . OVER THE COUNTER MEDICATION  Patient reports using a prescription cream .  This cream is not helping the "bites" she is talking about today.    Marland Kitchen HYDROXYZINE HCL 25 MG PO TABS Oral Take 1 tablet (25 mg total) by mouth every 6 (six) hours. 12 tablet 0  . METHYLERGONOVINE MALEATE 0.2 MG PO TABS Oral Take 1 tablet (0.2 mg total) by mouth every 6 (six) hours. 8 tablet 0  . TRIAMCINOLONE ACETONIDE 0.1 % EX CREA Topical Apply topically 2 (two) times daily. Apply bid x 2 weeks 30 g 0    BP 106/65  Pulse 67  Temp(Src) 98.6 F (37 C) (Oral)  Resp 17  SpO2 100%  LMP 05/10/2011  Breastfeeding? Unknown  Physical Exam  Nursing note and vitals reviewed. Constitutional: She appears well-developed and well-nourished.  Eyes: Conjunctivae are normal.  Neck: Neck supple.  Skin: Rash noted.          Some are macular some are papules scattered pattern mainly on her  aspect of both forearms on the Of upper torso inspecting the rest of the torso some eruptions on her face as well and back and legs seem to be pruritic. No linear tracts no interdigital rashes papules non-periumbilical or axillary.    ED Course  Procedures (including critical care time)  Labs Reviewed - No data to display No results found.   1. Urticaria       MDM  Pruritic recurrent rash that has been recurrent since December. Patient is convinced and concern that she might have some bedbugs in her at her house. Lives with HER-2 sons and they have recently within the last week develop some bumps under skin. Describes that she will take him to see their pediatrician. Patient describes also that she has a dermatologist  which she has seen in the past for eczema and rashes. Was prescribed apparently over the phone a cream which she can't remember has been using with no relief she also described that she did see some and insects and that her landlord is refusing to have the environment checked. On exam rash seems to be urticarial in nature scattered many other areas no specific concentration of papular eruptions. Further examination revealed no interdigital rashes, periumbilical or axillary to suggest a scabies infection. Rash seems to blank very easily with pressure and seems to be more like a note to cardial or high rash which I will allow to be induced or greater by an insect bite but has no pattern of bedbugs for example. Have encouraged mother to followup with her dermatologist as she has received a prescription from them and she described to [place] during the interview        Jimmie Molly, MD 05/12/11 1411

## 2011-07-18 ENCOUNTER — Inpatient Hospital Stay (HOSPITAL_COMMUNITY)
Admission: AD | Admit: 2011-07-18 | Discharge: 2011-07-18 | Disposition: A | Discharge status: Home / Self Care | Payer: Medicaid Other | Source: Ambulatory Visit | Attending: Obstetrics and Gynecology | Admitting: Obstetrics and Gynecology

## 2011-07-18 ENCOUNTER — Encounter (HOSPITAL_COMMUNITY): Payer: Self-pay

## 2011-07-18 DIAGNOSIS — Z3201 Encounter for pregnancy test, result positive: Secondary | ICD-10-CM | POA: Insufficient documentation

## 2011-07-18 NOTE — MAU Provider Note (Addendum)
Patient presents for pregnancy verification letter.  Denies spotting, bleeding or cramping or other pregnancy complaints.

## 2011-07-18 NOTE — MAU Note (Signed)
Pt states had miscarriage a few months ago, LMP-06/08/2011, denies pain, bleeding or abnormal vaginal d/c changes.

## 2011-07-18 NOTE — MAU Note (Signed)
Wants upt

## 2011-07-23 NOTE — MAU Provider Note (Signed)
Attestation of Attending Supervision of Advanced Practitioner: Evaluation and management procedures were performed by the PA/NP/CNM/OB Fellow under my supervision/collaboration. Chart reviewed and agree with management and plan.  Torah Pinnock V 07/23/2011 7:21 PM

## 2011-08-30 ENCOUNTER — Encounter: Payer: Medicaid Other | Admitting: Obstetrics & Gynecology

## 2011-09-13 ENCOUNTER — Encounter: Payer: Self-pay | Admitting: Obstetrics & Gynecology

## 2011-09-13 ENCOUNTER — Other Ambulatory Visit: Payer: Self-pay | Admitting: Obstetrics & Gynecology

## 2011-09-13 ENCOUNTER — Other Ambulatory Visit (HOSPITAL_COMMUNITY)
Admission: RE | Admit: 2011-09-13 | Discharge: 2011-09-13 | Disposition: A | Discharge status: Home / Self Care | Payer: Medicaid Other | Source: Ambulatory Visit | Attending: Obstetrics & Gynecology | Admitting: Obstetrics & Gynecology

## 2011-09-13 ENCOUNTER — Ambulatory Visit (INDEPENDENT_AMBULATORY_CARE_PROVIDER_SITE_OTHER): Payer: Medicaid Other | Admitting: Obstetrics & Gynecology

## 2011-09-13 ENCOUNTER — Ambulatory Visit (HOSPITAL_COMMUNITY)
Admission: RE | Admit: 2011-09-13 | Discharge: 2011-09-13 | Disposition: A | Discharge status: Home / Self Care | Payer: Medicaid Other | Source: Ambulatory Visit | Attending: Obstetrics & Gynecology | Admitting: Obstetrics & Gynecology

## 2011-09-13 ENCOUNTER — Encounter (HOSPITAL_COMMUNITY): Payer: Self-pay

## 2011-09-13 VITALS — BP 101/63 | Temp 98.5°F | Wt 114.3 lb

## 2011-09-13 DIAGNOSIS — O0990 Supervision of high risk pregnancy, unspecified, unspecified trimester: Secondary | ICD-10-CM

## 2011-09-13 DIAGNOSIS — O09219 Supervision of pregnancy with history of pre-term labor, unspecified trimester: Secondary | ICD-10-CM

## 2011-09-13 DIAGNOSIS — Z8751 Personal history of pre-term labor: Secondary | ICD-10-CM | POA: Insufficient documentation

## 2011-09-13 DIAGNOSIS — Z3689 Encounter for other specified antenatal screening: Secondary | ICD-10-CM | POA: Insufficient documentation

## 2011-09-13 DIAGNOSIS — O09299 Supervision of pregnancy with other poor reproductive or obstetric history, unspecified trimester: Secondary | ICD-10-CM | POA: Insufficient documentation

## 2011-09-13 DIAGNOSIS — Z01419 Encounter for gynecological examination (general) (routine) without abnormal findings: Secondary | ICD-10-CM | POA: Insufficient documentation

## 2011-09-13 LAB — HIV ANTIBODY (ROUTINE TESTING W REFLEX): HIV: NONREACTIVE

## 2011-09-13 LAB — POCT URINALYSIS DIP (DEVICE)
Hgb urine dipstick: NEGATIVE
Protein, ur: NEGATIVE mg/dL
Specific Gravity, Urine: >=1.03 (ref 1.005–1.030)
Urobilinogen, UA: 0.2 mg/dL (ref 0.0–1.0)

## 2011-09-13 MED ORDER — PRENATAL VITAMINS 0.8 MG PO TABS: 1.0000 | ORAL_TABLET | Freq: Every day | ORAL | Status: DC

## 2011-09-13 NOTE — Progress Notes (Signed)
   Subjective:    Lindsey Velazquez is a [id number] [redacted]w[redacted]d being seen today for her first obstetrical visit.  Her obstetrical history is significant for previous PTB at 31 weeks 5 days, previous SGA baby at term. Patient does intend to breast feed. Pregnancy history fully reviewed.  Patient reports nausea and vomiting.  Filed Vitals:   09/13/11 0832  BP: 101/63  Temp: 98.5 F (36.9 C)  Weight: 114 lb 4.8 oz (51.846 kg)    HISTORY: OB History    Grav Para Term Preterm Abortions TAB SAB Ect Mult Living   4 2 1 1 1  0 1 0 0 2     # Outc Date GA Lbr Len/2nd Wgt Sex Del Anes PTL Lv   1 PRE 10/09 [redacted]w[redacted]d  3lb15oz(1.786kg) M SVD None  Yes   Comments: born [facility], had PTL, PTD, was on bedrest   2 TRM 8/11 [redacted]w[redacted]d  5lb8oz(2.495kg) F SVD None  Yes   Comments: born [facility], no complications   3 SAB 12/12 [redacted]w[redacted]d          Comments: System Generated. Please review and update pregnancy details.   4 CUR              Past Medical History  Diagnosis Date  . Eczema   . Anxiety   . Depression    Past Surgical History  Procedure Date  . No past surgeries    Family History  Problem Relation Age of Onset  . Diabetes Mother   . Thyroid disease Sister      Exam    Uterus:     Pelvic Exam:    Perineum: No Hemorrhoids   Vulva: normal   Vagina:  normal mucosa, normal discharge   pH:    Cervix: no cervical motion tenderness   Adnexa: normal adnexa   Bony Pelvis: average  System: Breast:  normal appearance, no masses or tenderness   Skin: normal coloration and turgor, no rashes    Neurologic: oriented, normal mood   Extremities: normal strength, tone, and muscle mass   HEENT PERRLA, neck supple with midline trachea and thyroid without masses   Mouth/Teeth mucous membranes moist, pharynx normal without lesions   Neck supple   Cardiovascular: regular rate and rhythm   Respiratory:  appears well, vitals normal, no respiratory distress, acyanotic, normal RR, ear and throat exam is normal, neck  free of mass or lymphadenopathy, chest clear, no wheezing, crepitations, rhonchi, normal symmetric air entry   Abdomen: soft, non-tender; bowel sounds normal; no masses,  no organomegaly   Urinary: urethral meatus normal      Assessment:    Pregnancy: [id number] Patient Active Problem List  Diagnosis  . H/O premature delivery  . Supervision of high-risk pregnancy        Plan:     Initial labs drawn. Prenatal vitamins. Problem list reviewed and updated. Genetic Screening discussed First Screen: requested.  Ultrasound discussed; fetal survey: requested.  Follow up in 3 weeks. 50% of 30 min visit spent on counseling and coordination of care.  17 P start after 16 weeks   Jemarion Roycroft 09/13/2011

## 2011-09-13 NOTE — Patient Instructions (Signed)
Breastfeeding BENEFITS OF BREASTFEEDING For the baby  The first milk (colostrum) helps the baby's digestive system function better.   There are antibodies from the mother in the milk that help the baby fight off infections.   The baby has a lower incidence of asthma, allergies, and SIDS (sudden infant death syndrome).   The nutrients in breast milk are better than formulas for the baby and helps the baby's brain grow better.   Babies who breastfeed have less gas, colic, and constipation.  For the mother  Breastfeeding helps develop a very special bond between mother and baby.   It is more convenient, always available at the correct temperature and cheaper than formula feeding.   It burns calories in the mother and helps with losing weight that was gained during pregnancy.   It makes the uterus contract back down to normal size faster and slows bleeding following delivery.   Breastfeeding mothers have a lower risk of developing breast cancer.  NURSE FREQUENTLY  A healthy, full-term baby may breastfeed as often as every hour or space his or her feedings to every 3 hours.   How often to nurse will vary from baby to baby. Watch your baby for signs of hunger, not the clock.   Nurse as often as the baby requests, or when you feel the need to reduce the fullness of your breasts.   Awaken the baby if it has been 3 to 4 hours since the last feeding.   Frequent feeding will help the mother make more milk and will prevent problems like sore nipples and engorgement of the breasts.  BABY'S POSITION AT THE BREAST  Whether lying down or sitting, be sure that the baby's tummy is facing your tummy.   Support the breast with 4 fingers underneath the breast and the thumb above. Make sure your fingers are well away from the nipple and baby's mouth.   Stroke the baby's lips and cheek closest to the breast gently with your finger or nipple.   When the baby's mouth is open wide enough, place all  of your nipple and as much of the dark area around the nipple as possible into your baby's mouth.   Pull the baby in close so the tip of the nose and the baby's cheeks touch the breast during the feeding.  FEEDINGS  The length of each feeding varies from baby to baby and from feeding to feeding.   The baby must suck about 2 to 3 minutes for your milk to get to him or her. This is called a "let down." For this reason, allow the baby to feed on each breast as long as he or she wants. Your baby will end the feeding when he or she has received the right balance of nutrients.   To break the suction, put your finger into the corner of the baby's mouth and slide it between his or her gums before removing your breast from his or her mouth. This will help prevent sore nipples.  REDUCING BREAST ENGORGEMENT  In the first week after your baby is born, you may experience signs of breast engorgement. When breasts are engorged, they feel heavy, warm, full, and may be tender to the touch. You can reduce engorgement if you:   Nurse frequently, every 2 to 3 hours. Mothers who breastfeed early and often have fewer problems with engorgement.   Place light ice packs on your breasts between feedings. This reduces swelling. Wrap the ice packs in a   lightweight towel to protect your skin.   Apply moist hot packs to your breast for 5 to 10 minutes before each feeding. This increases circulation and helps the milk flow.   Gently massage your breast before and during the feeding.   Make sure that the baby empties at least one breast at every feeding before switching sides.   Use a breast pump to empty the breasts if your baby is sleepy or not nursing well. You may also want to pump if you are returning to work or or you feel you are getting engorged.   Avoid bottle feeds, pacifiers or supplemental feedings of water or juice in place of breastfeeding.   Be sure the baby is latched on and positioned properly while  breastfeeding.   Prevent fatigue, stress, and anemia.   Wear a supportive bra, avoiding underwire styles.   Eat a balanced diet with enough fluids.  If you follow these suggestions, your engorgement should improve in 24 to 48 hours. If you are still experiencing difficulty, call your lactation consultant or caregiver. IS MY BABY GETTING ENOUGH MILK? Sometimes, mothers worry about whether their babies are getting enough milk. You can be assured that your baby is getting enough milk if:  The baby is actively sucking and you hear swallowing.   The baby nurses at least 8 to 12 times in a 24 hour time period. Nurse your baby until he or she unlatches or falls asleep at the first breast (at least 10 to 20 minutes), then offer the second side.   The baby is wetting 5 to 6 disposable diapers (6 to 8 cloth diapers) in a 24 hour period by 5 to 6 days of age.   The baby is having at least 2 to 3 stools every 24 hours for the first few months. Breast milk is all the food your baby needs. It is not necessary for your baby to have water or formula. In fact, to help your breasts make more milk, it is best not to give your baby supplemental feedings during the early weeks.   The stool should be soft and yellow.   The baby should gain 4 to 7 ounces per week after he is 4 days old.  TAKE CARE OF YOURSELF Take care of your breasts by:  Bathing or showering daily.   Avoiding the use of soaps on your nipples.   Start feedings on your left breast at one feeding and on your right breast at the next feeding.   You will notice an increase in your milk supply 2 to 5 days after delivery. You may feel some discomfort from engorgement, which makes your breasts very firm and often tender. Engorgement "peaks" out within 24 to 48 hours. In the meantime, apply warm moist towels to your breasts for 5 to 10 minutes before feeding. Gentle massage and expression of some milk before feeding will soften your breasts, making  it easier for your baby to latch on. Wear a well fitting nursing bra and air dry your nipples for 10 to 15 minutes after each feeding.   Only use cotton bra pads.   Only use pure lanolin on your nipples after nursing. You do not need to wash it off before nursing.  Take care of yourself by:   Eating well-balanced meals and nutritious snacks.   Drinking milk, fruit juice, and water to satisfy your thirst (about 8 glasses a day).   Getting plenty of rest.   Increasing calcium in   your diet (1200 mg a day).   Avoiding foods that you notice affect the baby in a bad way.  SEEK MEDICAL CARE IF:   You have any questions or difficulty with breastfeeding.   You need help.   You have a hard, red, sore area on your breast, accompanied by a fever of 100.5 F (38.1 C) or more.   Your baby is too sleepy to eat well or is having trouble sleeping.   Your baby is wetting less than 6 diapers per day, by 5 days of age.   Your baby's skin or white part of his or her eyes is more yellow than it was in the hospital.   You feel depressed.  Document Released: 01/29/2005 Document Revised: 01/18/2011 Document Reviewed: 09/13/2008 [employer] Patient Information 2012 [employer]. 

## 2011-09-13 NOTE — Progress Notes (Signed)
Nutrition Note:  1st visit Diagnosis:  Previous preterm delivery and delivery of SGA infant.   Weight gain is > expected. Pt. Given verbal & written general nutrition during pregnancy and breastfeeding information. Client reports good intake.  Some nausea.  Does not drink milk/does eat cheese.  No PNV currently.  No food allergies.  Reports some nausea. Client agrees to increase intake of calcium rich foods.  Plans to breastfeed.  Will eat 3 meals & 2-3 snacks/day. Discussed weight gain goals of 25-35#. Will complete [facility] certification 09/14/11. Wilford Sports, MPH, RD, LDN

## 2011-09-13 NOTE — Progress Notes (Signed)
P:=72, Given new patient information.  Also discussed appropriate weight gain. Patient states she thinks her stomach has gotten smaller since pregnant

## 2011-09-14 LAB — GC/CHLAMYDIA PROBE AMP, GENITAL: GC Probe Amp, Genital: NEGATIVE

## 2011-09-14 LAB — OBSTETRIC PANEL
Antibody Screen: NEGATIVE
Eosinophils Absolute: 0.1 10*3/uL (ref 0.0–0.7)
Eosinophils Relative: 2 % (ref 0–5)
HCT: 37 % (ref 36.0–46.0)
Lymphs Abs: 1.5 10*3/uL (ref 0.7–4.0)
MCH: 31.4 pg (ref 26.0–34.0)
MCV: 93 fL (ref 78.0–100.0)
Monocytes Absolute: 0.5 10*3/uL (ref 0.1–1.0)
Platelets: 262 10*3/uL (ref 150–400)
RBC: 3.98 MIL/uL (ref 3.87–5.11)
RDW: 15.2 % (ref 11.5–15.5)
Rh Type: POSITIVE

## 2011-09-15 LAB — CULTURE, URINE COMPREHENSIVE: Colony Count: NO GROWTH

## 2011-09-18 ENCOUNTER — Telehealth: Payer: Self-pay | Admitting: *Deleted

## 2011-09-18 NOTE — Telephone Encounter (Signed)
Pt left message requesting call form nurse. I called pt and she stated that her due date was changed last week during her [place] appt @ [facility]. She wants to know if this will affect the start date of her 17P injections. I told pt that it will and she will need to start sooner than originally planned. I will check to see when we will have medication available for her and then call her back. Mayra Neer- clinic manager notified to order 17P.  Appt scheduled for 09/27/11 @ 0900 for Ob f/u and 17P injection. I then left message on pt's voice mail of appt info.

## 2011-09-27 ENCOUNTER — Ambulatory Visit (INDEPENDENT_AMBULATORY_CARE_PROVIDER_SITE_OTHER): Payer: Medicaid Other | Admitting: Family

## 2011-09-27 VITALS — BP 106/66 | Temp 98.1°F | Wt 120.6 lb

## 2011-09-27 DIAGNOSIS — Z283 Underimmunization status: Secondary | ICD-10-CM

## 2011-09-27 DIAGNOSIS — O09219 Supervision of pregnancy with history of pre-term labor, unspecified trimester: Secondary | ICD-10-CM

## 2011-09-27 DIAGNOSIS — O9989 Other specified diseases and conditions complicating pregnancy, childbirth and the puerperium: Secondary | ICD-10-CM

## 2011-09-27 DIAGNOSIS — Z8751 Personal history of pre-term labor: Secondary | ICD-10-CM

## 2011-09-27 DIAGNOSIS — O0990 Supervision of high risk pregnancy, unspecified, unspecified trimester: Secondary | ICD-10-CM

## 2011-09-27 LAB — POCT URINALYSIS DIP (DEVICE)
Hgb urine dipstick: NEGATIVE
Ketones, ur: NEGATIVE mg/dL
Protein, ur: NEGATIVE mg/dL
Specific Gravity, Urine: 1.02 (ref 1.005–1.030)
Urobilinogen, UA: 0.2 mg/dL (ref 0.0–1.0)
pH: 7 (ref 5.0–8.0)

## 2011-09-27 MED ORDER — HYDROXYPROGESTERONE CAPROATE 250 MG/ML IM OIL
250.0000 mg | TOPICAL_OIL | INTRAMUSCULAR | Status: AC
  Administered 2011-09-27 – 2011-10-18 (×3): 250 mg via INTRAMUSCULAR

## 2011-09-27 MED ORDER — HYDROXYPROGESTERONE CAPROATE 250 MG/ML IM OIL: 250.0000 mg | TOPICAL_OIL | INTRAMUSCULAR | Status: DC

## 2011-09-27 NOTE — Addendum Note (Signed)
Addended by: Faythe Casa on: 09/27/2011 10:06 AM   Modules accepted: Orders

## 2011-09-27 NOTE — Progress Notes (Signed)
P= 69 Occasional lower abdominal pressure

## 2011-09-27 NOTE — Progress Notes (Signed)
No questions or concerns; 17p started today; schedule ultrasound for next week.

## 2011-10-04 ENCOUNTER — Encounter: Payer: Medicaid Other | Admitting: Advanced Practice Midwife

## 2011-10-04 ENCOUNTER — Ambulatory Visit (INDEPENDENT_AMBULATORY_CARE_PROVIDER_SITE_OTHER): Payer: Medicaid Other | Admitting: Medical

## 2011-10-04 ENCOUNTER — Ambulatory Visit (HOSPITAL_COMMUNITY)
Admission: RE | Admit: 2011-10-04 | Discharge: 2011-10-04 | Disposition: A | Discharge status: Home / Self Care | Payer: Medicaid Other | Source: Ambulatory Visit | Attending: Family | Admitting: Family

## 2011-10-04 VITALS — BP 108/68 | HR 84 | Temp 97.8°F | Resp 12 | Wt 123.7 lb

## 2011-10-04 DIAGNOSIS — Z1389 Encounter for screening for other disorder: Secondary | ICD-10-CM | POA: Insufficient documentation

## 2011-10-04 DIAGNOSIS — O358XX0 Maternal care for other (suspected) fetal abnormality and damage, not applicable or unspecified: Secondary | ICD-10-CM | POA: Insufficient documentation

## 2011-10-04 DIAGNOSIS — O09299 Supervision of pregnancy with other poor reproductive or obstetric history, unspecified trimester: Secondary | ICD-10-CM | POA: Insufficient documentation

## 2011-10-04 DIAGNOSIS — Z283 Underimmunization status: Secondary | ICD-10-CM

## 2011-10-04 DIAGNOSIS — Z8751 Personal history of pre-term labor: Secondary | ICD-10-CM

## 2011-10-04 DIAGNOSIS — Z363 Encounter for antenatal screening for malformations: Secondary | ICD-10-CM | POA: Insufficient documentation

## 2011-10-04 DIAGNOSIS — O09219 Supervision of pregnancy with history of pre-term labor, unspecified trimester: Secondary | ICD-10-CM

## 2011-10-04 DIAGNOSIS — O0990 Supervision of high risk pregnancy, unspecified, unspecified trimester: Secondary | ICD-10-CM

## 2011-10-04 LAB — POCT URINALYSIS DIP (DEVICE)
Hgb urine dipstick: NEGATIVE
Nitrite: NEGATIVE
Protein, ur: NEGATIVE mg/dL
Specific Gravity, Urine: >=1.03 (ref 1.005–1.030)
Urobilinogen, UA: 0.2 mg/dL (ref 0.0–1.0)
pH: 6.5 (ref 5.0–8.0)

## 2011-10-11 ENCOUNTER — Ambulatory Visit (INDEPENDENT_AMBULATORY_CARE_PROVIDER_SITE_OTHER): Payer: Medicaid Other | Admitting: Family Medicine

## 2011-10-11 VITALS — BP 104/63 | Temp 98.0°F | Wt 125.1 lb

## 2011-10-11 DIAGNOSIS — Z8751 Personal history of pre-term labor: Secondary | ICD-10-CM

## 2011-10-11 DIAGNOSIS — O09219 Supervision of pregnancy with history of pre-term labor, unspecified trimester: Secondary | ICD-10-CM

## 2011-10-11 DIAGNOSIS — O0990 Supervision of high risk pregnancy, unspecified, unspecified trimester: Secondary | ICD-10-CM

## 2011-10-11 LAB — POCT URINALYSIS DIP (DEVICE)
Hgb urine dipstick: NEGATIVE
Nitrite: NEGATIVE
Protein, ur: NEGATIVE mg/dL
pH: 7.5 (ref 5.0–8.0)

## 2011-10-11 NOTE — Progress Notes (Signed)
Pulse: 89

## 2011-10-11 NOTE — Progress Notes (Signed)
Doing well-on 17 P 

## 2011-10-11 NOTE — Patient Instructions (Signed)
Pregnancy - Second Trimester The second trimester of pregnancy (3 to 6 months) is a period of rapid growth for you and your baby. At the end of the sixth month, your baby is about 9 inches long and weighs 1 1/2 pounds. You will begin to feel the baby move between 18 and 20 weeks of the pregnancy. This is called quickening. Weight gain is faster. A clear fluid (colostrum) may leak out of your breasts. You may feel small contractions of the womb (uterus). This is known as false labor or Braxton-Hicks contractions. This is like a practice for labor when the baby is ready to be born. Usually, the problems with morning sickness have usually passed by the end of your first trimester. Some women develop small dark blotches (called cholasma, mask of pregnancy) on their face that usually goes away after the baby is born. Exposure to the sun makes the blotches worse. Acne may also develop in some pregnant women and pregnant women who have acne, may find that it goes away. PRENATAL EXAMS  Blood work may continue to be done during prenatal exams. These tests are done to check on your health and the probable health of your baby. Blood work is used to follow your blood levels (hemoglobin). Anemia (low hemoglobin) is common during pregnancy. Iron and vitamins are given to help prevent this. You will also be checked for diabetes between 24 and 28 weeks of the pregnancy. Some of the previous blood tests may be repeated.   The size of the uterus is measured during each visit. This is to make sure that the baby is continuing to grow properly according to the dates of the pregnancy.   Your blood pressure is checked every prenatal visit. This is to make sure you are not getting toxemia.   Your urine is checked to make sure you do not have an infection, diabetes or protein in the urine.   Your weight is checked often to make sure gains are happening at the suggested rate. This is to ensure that both you and your baby are  growing normally.   Sometimes, an ultrasound is performed to confirm the proper growth and development of the baby. This is a test which bounces harmless sound waves off the baby so your caregiver can more accurately determine due dates.  Sometimes, a specialized test is done on the amniotic fluid surrounding the baby. This test is called an amniocentesis. The amniotic fluid is obtained by sticking a needle into the belly (abdomen). This is done to check the chromosomes in instances where there is a concern about possible genetic problems with the baby. It is also sometimes done near the end of pregnancy if an early delivery is required. In this case, it is done to help make sure the baby's lungs are mature enough for the baby to live outside of the womb. CHANGES OCCURING IN THE SECOND TRIMESTER OF PREGNANCY Your body goes through many changes during pregnancy. They vary from person to person. Talk to your caregiver about changes you notice that you are concerned about.  During the second trimester, you will likely have an increase in your appetite. It is normal to have cravings for certain foods. This varies from person to person and pregnancy to pregnancy.   Your lower abdomen will begin to bulge.   You may have to urinate more often because the uterus and baby are pressing on your bladder. It is also common to get more bladder infections during pregnancy (  pain with urination). You can help this by drinking lots of fluids and emptying your bladder before and after intercourse.   You may begin to get stretch marks on your hips, abdomen, and breasts. These are normal changes in the body during pregnancy. There are no exercises or medications to take that prevent this change.   You may begin to develop swollen and bulging veins (varicose veins) in your legs. Wearing support hose, elevating your feet for 15 minutes, 3 to 4 times a day and limiting salt in your diet helps lessen the problem.    Heartburn may develop as the uterus grows and pushes up against the stomach. Antacids recommended by your caregiver helps with this problem. Also, eating smaller meals 4 to 5 times a day helps.   Constipation can be treated with a stool softener or adding bulk to your diet. Drinking lots of fluids, vegetables, fruits, and whole grains are helpful.   Exercising is also helpful. If you have been very active up until your pregnancy, most of these activities can be continued during your pregnancy. If you have been less active, it is helpful to start an exercise program such as walking.   Hemorrhoids (varicose veins in the rectum) may develop at the end of the second trimester. Warm sitz baths and hemorrhoid cream recommended by your caregiver helps hemorrhoid problems.   Backaches may develop during this time of your pregnancy. Avoid heavy lifting, wear low heal shoes and practice good posture to help with backache problems.   Some pregnant women develop tingling and numbness of their hand and fingers because of swelling and tightening of ligaments in the wrist (carpel tunnel syndrome). This goes away after the baby is born.   As your breasts enlarge, you may have to get a bigger bra. Get a comfortable, cotton, support bra. Do not get a nursing bra until the last month of the pregnancy if you will be nursing the baby.   You may get a dark line from your belly button to the pubic area called the linea nigra.   You may develop rosy cheeks because of increase blood flow to the face.   You may develop spider looking lines of the face, neck, arms and chest. These go away after the baby is born.  HOME CARE INSTRUCTIONS   It is extremely important to avoid all smoking, herbs, alcohol, and unprescribed drugs during your pregnancy. These chemicals affect the formation and growth of the baby. Avoid these chemicals throughout the pregnancy to ensure the delivery of a healthy infant.   Most of your home  care instructions are the same as suggested for the first trimester of your pregnancy. Keep your caregiver's appointments. Follow your caregiver's instructions regarding medication use, exercise and diet.   During pregnancy, you are providing food for you and your baby. Continue to eat regular, well-balanced meals. Choose foods such as meat, fish, milk and other low fat dairy products, vegetables, fruits, and whole-grain breads and cereals. Your caregiver will tell you of the ideal weight gain.   A physical sexual relationship may be continued up until near the end of pregnancy if there are no other problems. Problems could include early (premature) leaking of amniotic fluid from the membranes, vaginal bleeding, abdominal pain, or other medical or pregnancy problems.   Exercise regularly if there are no restrictions. Check with your caregiver if you are unsure of the safety of some of your exercises. The greatest weight gain will occur in the   last 2 trimesters of pregnancy. Exercise will help you:   Control your weight.   Get you in shape for labor and delivery.   Lose weight after you have the baby.   Wear a good support or jogging bra for breast tenderness during pregnancy. This may help if worn during sleep. Pads or tissues may be used in the bra if you are leaking colostrum.   Do not use hot tubs, steam rooms or saunas throughout the pregnancy.   Wear your seat belt at all times when driving. This protects you and your baby if you are in an accident.   Avoid raw meat, uncooked cheese, cat litter boxes and soil used by cats. These carry germs that can cause birth defects in the baby.   The second trimester is also a good time to visit your dentist for your dental health if this has not been done yet. Getting your teeth cleaned is OK. Use a soft toothbrush. Brush gently during pregnancy.   It is easier to loose urine during pregnancy. Tightening up and strengthening the pelvic muscles will  help with this problem. Practice stopping your urination while you are going to the bathroom. These are the same muscles you need to strengthen. It is also the muscles you would use as if you were trying to stop from passing gas. You can practice tightening these muscles up 10 times a set and repeating this about 3 times per day. Once you know what muscles to tighten up, do not perform these exercises during urination. It is more likely to contribute to an infection by backing up the urine.   Ask for help if you have financial, counseling or nutritional needs during pregnancy. Your caregiver will be able to offer counseling for these needs as well as refer you for other special needs.   Your skin may become oily. If so, wash your face with mild soap, use non-greasy moisturizer and oil or cream based makeup.  MEDICATIONS AND DRUG USE IN PREGNANCY  Take prenatal vitamins as directed. The vitamin should contain 1 milligram of folic acid. Keep all vitamins out of reach of children. Only a couple vitamins or tablets containing iron may be fatal to a baby or young child when ingested.   Avoid use of all medications, including herbs, over-the-counter medications, not prescribed or suggested by your caregiver. Only take over-the-counter or prescription medicines for pain, discomfort, or fever as directed by your caregiver. Do not use aspirin.   Let your caregiver also know about herbs you may be using.   Alcohol is related to a number of birth defects. This includes fetal alcohol syndrome. All alcohol, in any form, should be avoided completely. Smoking will cause low birth rate and premature babies.   Street or illegal drugs are very harmful to the baby. They are absolutely forbidden. A baby born to an addicted mother will be addicted at birth. The baby will go through the same withdrawal an adult does.  SEEK MEDICAL CARE IF:  You have any concerns or worries during your pregnancy. It is better to call with  your questions if you feel they cannot wait, rather than worry about them. SEEK IMMEDIATE MEDICAL CARE IF:   An unexplained oral temperature above 102 F (38.9 C) develops, or as your caregiver suggests.   You have leaking of fluid from the vagina (birth canal). If leaking membranes are suspected, take your temperature and tell your caregiver of this when you call.   There   is vaginal spotting, bleeding, or passing clots. Tell your caregiver of the amount and how many pads are used. Light spotting in pregnancy is common, especially following intercourse.   You develop a bad smelling vaginal discharge with a change in the color from clear to white.   You continue to feel sick to your stomach (nauseated) and have no relief from remedies suggested. You vomit blood or coffee ground-like materials.   You lose more than 2 pounds of weight or gain more than 2 pounds of weight over 1 week, or as suggested by your caregiver.   You notice swelling of your face, hands, feet, or legs.   You get exposed to German measles and have never had them.   You are exposed to fifth disease or chickenpox.   You develop belly (abdominal) pain. Round ligament discomfort is a common non-cancerous (benign) cause of abdominal pain in pregnancy. Your caregiver still must evaluate you.   You develop a bad headache that does not go away.   You develop fever, diarrhea, pain with urination, or shortness of breath.   You develop visual problems, blurry, or double vision.   You fall or are in a car accident or any kind of trauma.   There is mental or physical violence at home.  Document Released: 01/23/2001 Document Revised: 01/18/2011 Document Reviewed: 07/28/2008 [employer] Patient Information 2012 [employer]. Breastfeeding BENEFITS OF BREASTFEEDING For the baby  The first milk (colostrum) helps the baby's digestive system function better.   There are antibodies from the mother in the milk that help the  baby fight off infections.   The baby has a lower incidence of asthma, allergies, and SIDS (sudden infant death syndrome).   The nutrients in breast milk are better than formulas for the baby and helps the baby's brain grow better.   Babies who breastfeed have less gas, colic, and constipation.  For the mother  Breastfeeding helps develop a very special bond between mother and baby.   It is more convenient, always available at the correct temperature and cheaper than formula feeding.   It burns calories in the mother and helps with losing weight that was gained during pregnancy.   It makes the uterus contract back down to normal size faster and slows bleeding following delivery.   Breastfeeding mothers have a lower risk of developing breast cancer.  NURSE FREQUENTLY  A healthy, full-term baby may breastfeed as often as every hour or space his or her feedings to every 3 hours.   How often to nurse will vary from baby to baby. Watch your baby for signs of hunger, not the clock.   Nurse as often as the baby requests, or when you feel the need to reduce the fullness of your breasts.   Awaken the baby if it has been 3 to 4 hours since the last feeding.   Frequent feeding will help the mother make more milk and will prevent problems like sore nipples and engorgement of the breasts.  BABY'S POSITION AT THE BREAST  Whether lying down or sitting, be sure that the baby's tummy is facing your tummy.   Support the breast with 4 fingers underneath the breast and the thumb above. Make sure your fingers are well away from the nipple and baby's mouth.   Stroke the baby's lips and cheek closest to the breast gently with your finger or nipple.   When the baby's mouth is open wide enough, place all of your nipple and as much   of the dark area around the nipple as possible into your baby's mouth.   Pull the baby in close so the tip of the nose and the baby's cheeks touch the breast during the  feeding.  FEEDINGS  The length of each feeding varies from baby to baby and from feeding to feeding.   The baby must suck about 2 to 3 minutes for your milk to get to him or her. This is called a "let down." For this reason, allow the baby to feed on each breast as long as he or she wants. Your baby will end the feeding when he or she has received the right balance of nutrients.   To break the suction, put your finger into the corner of the baby's mouth and slide it between his or her gums before removing your breast from his or her mouth. This will help prevent sore nipples.  REDUCING BREAST ENGORGEMENT  In the first week after your baby is born, you may experience signs of breast engorgement. When breasts are engorged, they feel heavy, warm, full, and may be tender to the touch. You can reduce engorgement if you:   Nurse frequently, every 2 to 3 hours. Mothers who breastfeed early and often have fewer problems with engorgement.   Place light ice packs on your breasts between feedings. This reduces swelling. Wrap the ice packs in a lightweight towel to protect your skin.   Apply moist hot packs to your breast for 5 to 10 minutes before each feeding. This increases circulation and helps the milk flow.   Gently massage your breast before and during the feeding.   Make sure that the baby empties at least one breast at every feeding before switching sides.   Use a breast pump to empty the breasts if your baby is sleepy or not nursing well. You may also want to pump if you are returning to work or or you feel you are getting engorged.   Avoid bottle feeds, pacifiers or supplemental feedings of water or juice in place of breastfeeding.   Be sure the baby is latched on and positioned properly while breastfeeding.   Prevent fatigue, stress, and anemia.   Wear a supportive bra, avoiding underwire styles.   Eat a balanced diet with enough fluids.  If you follow these suggestions, your  engorgement should improve in 24 to 48 hours. If you are still experiencing difficulty, call your lactation consultant or caregiver. IS MY BABY GETTING ENOUGH MILK? Sometimes, mothers worry about whether their babies are getting enough milk. You can be assured that your baby is getting enough milk if:  The baby is actively sucking and you hear swallowing.   The baby nurses at least 8 to 12 times in a 24 hour time period. Nurse your baby until he or she unlatches or falls asleep at the first breast (at least 10 to 20 minutes), then offer the second side.   The baby is wetting 5 to 6 disposable diapers (6 to 8 cloth diapers) in a 24 hour period by 5 to 6 days of age.   The baby is having at least 2 to 3 stools every 24 hours for the first few months. Breast milk is all the food your baby needs. It is not necessary for your baby to have water or formula. In fact, to help your breasts make more milk, it is best not to give your baby supplemental feedings during the early weeks.   The stool should   be soft and yellow.   The baby should gain 4 to 7 ounces per week after he is 4 days old.  TAKE CARE OF YOURSELF Take care of your breasts by:  Bathing or showering daily.   Avoiding the use of soaps on your nipples.   Start feedings on your left breast at one feeding and on your right breast at the next feeding.   You will notice an increase in your milk supply 2 to 5 days after delivery. You may feel some discomfort from engorgement, which makes your breasts very firm and often tender. Engorgement "peaks" out within 24 to 48 hours. In the meantime, apply warm moist towels to your breasts for 5 to 10 minutes before feeding. Gentle massage and expression of some milk before feeding will soften your breasts, making it easier for your baby to latch on. Wear a well fitting nursing bra and air dry your nipples for 10 to 15 minutes after each feeding.   Only use cotton bra pads.   Only use pure lanolin on  your nipples after nursing. You do not need to wash it off before nursing.  Take care of yourself by:   Eating well-balanced meals and nutritious snacks.   Drinking milk, fruit juice, and water to satisfy your thirst (about 8 glasses a day).   Getting plenty of rest.   Increasing calcium in your diet (1200 mg a day).   Avoiding foods that you notice affect the baby in a bad way.  SEEK MEDICAL CARE IF:   You have any questions or difficulty with breastfeeding.   You need help.   You have a hard, red, sore area on your breast, accompanied by a fever of 100.5 F (38.1 C) or more.   Your baby is too sleepy to eat well or is having trouble sleeping.   Your baby is wetting less than 6 diapers per day, by 5 days of age.   Your baby's skin or white part of his or her eyes is more yellow than it was in the hospital.   You feel depressed.  Document Released: 01/29/2005 Document Revised: 01/18/2011 Document Reviewed: 09/13/2008 [employer] Patient Information 2012 [employer]. 

## 2011-10-12 ENCOUNTER — Encounter: Payer: Self-pay | Admitting: Family

## 2011-10-16 ENCOUNTER — Emergency Department (HOSPITAL_COMMUNITY)
Admission: EM | Admit: 2011-10-16 | Discharge: 2011-10-16 | Disposition: A | Discharge status: Home / Self Care | Payer: Medicaid Other | Attending: Emergency Medicine | Admitting: Emergency Medicine

## 2011-10-16 ENCOUNTER — Encounter (HOSPITAL_COMMUNITY): Payer: Self-pay | Admitting: Emergency Medicine

## 2011-10-16 ENCOUNTER — Telehealth: Payer: Self-pay | Admitting: Medical

## 2011-10-16 DIAGNOSIS — F17200 Nicotine dependence, unspecified, uncomplicated: Secondary | ICD-10-CM | POA: Insufficient documentation

## 2011-10-16 DIAGNOSIS — F341 Dysthymic disorder: Secondary | ICD-10-CM | POA: Insufficient documentation

## 2011-10-16 DIAGNOSIS — R51 Headache: Secondary | ICD-10-CM | POA: Insufficient documentation

## 2011-10-16 LAB — URINALYSIS, ROUTINE W REFLEX MICROSCOPIC
Bilirubin Urine: NEGATIVE
Hgb urine dipstick: NEGATIVE
Ketones, ur: NEGATIVE mg/dL
Nitrite: NEGATIVE
Specific Gravity, Urine: 1.026 (ref 1.005–1.030)
Urobilinogen, UA: 0.2 mg/dL (ref 0.0–1.0)

## 2011-10-16 LAB — URINE MICROSCOPIC-ADD ON

## 2011-10-16 MED ORDER — SODIUM CHLORIDE 0.9 % IV BOLUS (SEPSIS)
1000.0000 mL | Freq: Once | INTRAVENOUS | Status: AC
  Administered 2011-10-16: 1000 mL via INTRAVENOUS

## 2011-10-16 MED ORDER — DEXAMETHASONE SODIUM PHOSPHATE 10 MG/ML IJ SOLN
10.0000 mg | Freq: Once | INTRAMUSCULAR | Status: AC
  Administered 2011-10-16: 10 mg via INTRAVENOUS
  Filled 2011-10-16: qty 1

## 2011-10-16 MED ORDER — METOCLOPRAMIDE HCL 5 MG/ML IJ SOLN
10.0000 mg | Freq: Once | INTRAMUSCULAR | Status: AC
  Administered 2011-10-16: 10 mg via INTRAVENOUS
  Filled 2011-10-16: qty 2

## 2011-10-16 MED ORDER — DIPHENHYDRAMINE HCL 50 MG/ML IJ SOLN
25.0000 mg | Freq: Once | INTRAMUSCULAR | Status: AC
  Administered 2011-10-16: 25 mg via INTRAVENOUS
  Filled 2011-10-16: qty 1

## 2011-10-16 NOTE — ED Notes (Signed)
C/o intermittent h/a since yesterday. Pain across forehead, denies nausea "other than with pregnancy", no emesis. Denies photophobia. States nothing makes pain better or worse. Last took 325mg  tylenol at 0830.  Pt [redacted] weeks pregnant, EDD 03-05-12

## 2011-10-16 NOTE — Telephone Encounter (Signed)
Patient needs proof of pregnancy for medicaid sent to fax # [phone] attn: Willaim Rayas. Cannot be UPT needs to be [place].

## 2011-10-16 NOTE — ED Provider Notes (Signed)
History     CSN: [id number]  Arrival date & time 10/16/11  0865   First MD Initiated Contact with Patient 10/16/11 1106      Chief Complaint  Patient presents with  . Headache    (Consider location/radiation/quality/duration/timing/severity/associated sxs/prior treatment) HPI Comments: Patient presents with a two-day history of worsening frontal headache. She describes at throbbing pain to the left frontal region radiating around toward her left ear. Denies any specific ear pain or teeth pain. She states it started gradually and is getting worse since yesterday. She denies a history of similar headaches in the past. Denies any neck pain or stiffness. Denies any nausea or vomiting. Denies any fevers or chills. She's been taking Tylenol without relief. She denies any numbness or weakness in her extremities. She is currently [redacted] weeks pregnant and is followed by the high risk group at [facility]. This is due to a past history of preterm labor. She denies any vaginal bleeding or discharge. Denies any edema in her legs. Denies any shortness of breath. She is feeling the baby move around. Denies a history of recent head trauma  Patient is a 21 y.o. female presenting with headaches.  Headache  Pertinent negatives include no fever, no shortness of breath, no nausea and no vomiting.    Past Medical History  Diagnosis Date  . Eczema   . Anxiety   . Depression     Past Surgical History  Procedure Date  . No past surgeries     Family History  Problem Relation Age of Onset  . Diabetes Mother   . Thyroid disease Sister     History  Substance Use Topics  . Smoking status: Current Some Day Smoker    Types: Cigarettes  . Smokeless tobacco: Never Used  . Alcohol Use: No    OB History    Grav Para Term Preterm Abortions TAB SAB Ect Mult Living   4 2 1 1 1  0 1 0 0 2      Review of Systems  Constitutional: Negative for fever, chills, diaphoresis and fatigue.  HENT: Negative for  congestion, rhinorrhea and sneezing.   Eyes: Negative.   Respiratory: Negative for cough, chest tightness and shortness of breath.   Cardiovascular: Negative for chest pain and leg swelling.  Gastrointestinal: Negative for nausea, vomiting, abdominal pain, diarrhea and blood in stool.  Genitourinary: Negative for frequency, hematuria, flank pain and difficulty urinating.  Musculoskeletal: Negative for back pain and arthralgias.  Skin: Negative for rash.  Neurological: Positive for dizziness and headaches. Negative for speech difficulty, weakness and numbness.    Allergies  Penicillins  Home Medications   Current Outpatient Rx  Name Route Sig Dispense Refill  . ACETAMINOPHEN 500 MG PO TABS Oral Take 500 mg by mouth every 6 (six) hours as needed. For pain    . PRENATAL VITAMINS 0.8 MG PO TABS Oral Take 1 tablet by mouth daily. 30 tablet 12    BP 95/47  Pulse 72  Temp 97.4 F (36.3 C) (Oral)  Resp 16  SpO2 99%  LMP 06/08/2011  Physical Exam  Constitutional: She is oriented to person, place, and time. She appears well-developed and well-nourished.  HENT:  Head: Normocephalic and atraumatic.  Left Ear: External ear normal.  Mouth/Throat: Oropharynx is clear and moist.       No pain over sinuses  Eyes: Pupils are equal, round, and reactive to light.  Neck: Normal range of motion. Neck supple.  No meningeal signs  Cardiovascular: Normal rate, regular rhythm and normal heart sounds.   Pulmonary/Chest: Effort normal and breath sounds normal. No respiratory distress. She has no wheezes. She has no rales. She exhibits no tenderness.  Abdominal: Soft. Bowel sounds are normal. There is no tenderness. There is no rebound and no guarding.  Musculoskeletal: Normal range of motion. She exhibits no edema and no tenderness.  Lymphadenopathy:    She has no cervical adenopathy.  Neurological: She is alert and oriented to person, place, and time. She has normal strength. No cranial nerve  deficit or sensory deficit. GCS eye subscore is 4. GCS verbal subscore is 5. GCS motor subscore is 6.  Skin: Skin is warm and dry. No rash noted.  Psychiatric: She has a normal mood and affect.    ED Course  Procedures (including critical care time)  Results for orders placed during the hospital encounter of 10/16/11  URINALYSIS, ROUTINE W REFLEX MICROSCOPIC      Component Value Range   Color, Urine YELLOW  YELLOW   APPearance CLOUDY (*) CLEAR   Specific Gravity, Urine 1.026  1.005 - 1.030   pH 6.5  5.0 - 8.0   Glucose, UA NEGATIVE  NEGATIVE mg/dL   Hgb urine dipstick NEGATIVE  NEGATIVE   Bilirubin Urine NEGATIVE  NEGATIVE   Ketones, ur NEGATIVE  NEGATIVE mg/dL   Protein, ur NEGATIVE  NEGATIVE mg/dL   Urobilinogen, UA 0.2  0.0 - 1.0 mg/dL   Nitrite NEGATIVE  NEGATIVE   Leukocytes, UA SMALL (*) NEGATIVE  URINE MICROSCOPIC-ADD ON      Component Value Range   Squamous Epithelial / LPF MANY (*) RARE   WBC, UA 3-6  <3 WBC/hpf   Bacteria, UA MANY (*) RARE   Urine-Other MUCOUS PRESENT     US Ob Detail + 14 Wk  10/04/2011  OBSTETRICAL ULTRASOUND: This exam was performed within a [place] [facility]. The OB US report was generated in the [facility] system, and faxed to the ordering physician.   This report is also available in [employer] and in the [employer]. See [facility] Obstetric US report.      1. Headache       MDM  Pt feeling much better after migraine cocktail.  Headache has resolved.  She has no signs of preeclampsia.  No symptoms suggestive of SAH, meningitis.  Will instruct pt to f/u with her ob/gyn for a recheck.  Return here for any worsening symptoms.  Pt has no urinary symptoms, so I did not treat her with abx.  Culture sent.        Rolan Bucco, MD 10/16/11 1335

## 2011-10-16 NOTE — ED Notes (Addendum)
Pt c/o frontal HA x 2 days; pt sts [redacted] weeks pregnant with pre natal care; pt G4 P2 M1; pt c/o some dizziness

## 2011-10-17 ENCOUNTER — Encounter: Payer: Self-pay | Admitting: Obstetrics & Gynecology

## 2011-10-17 LAB — URINE CULTURE

## 2011-10-18 ENCOUNTER — Ambulatory Visit (INDEPENDENT_AMBULATORY_CARE_PROVIDER_SITE_OTHER): Payer: Medicaid Other | Admitting: *Deleted

## 2011-10-18 VITALS — BP 107/65 | HR 82 | Wt 128.6 lb

## 2011-10-18 DIAGNOSIS — O09219 Supervision of pregnancy with history of pre-term labor, unspecified trimester: Secondary | ICD-10-CM

## 2011-10-18 DIAGNOSIS — Z8751 Personal history of pre-term labor: Secondary | ICD-10-CM

## 2011-10-25 ENCOUNTER — Ambulatory Visit (INDEPENDENT_AMBULATORY_CARE_PROVIDER_SITE_OTHER): Payer: Medicaid Other | Admitting: Obstetrics and Gynecology

## 2011-10-25 VITALS — BP 112/72 | Temp 97.6°F | Wt 127.3 lb

## 2011-10-25 DIAGNOSIS — O0990 Supervision of high risk pregnancy, unspecified, unspecified trimester: Secondary | ICD-10-CM

## 2011-10-25 DIAGNOSIS — Z283 Underimmunization status: Secondary | ICD-10-CM

## 2011-10-25 DIAGNOSIS — O09219 Supervision of pregnancy with history of pre-term labor, unspecified trimester: Secondary | ICD-10-CM

## 2011-10-25 DIAGNOSIS — Z8751 Personal history of pre-term labor: Secondary | ICD-10-CM

## 2011-10-25 DIAGNOSIS — O9989 Other specified diseases and conditions complicating pregnancy, childbirth and the puerperium: Secondary | ICD-10-CM

## 2011-10-25 LAB — POCT URINALYSIS DIP (DEVICE)
Ketones, ur: NEGATIVE mg/dL
Protein, ur: NEGATIVE mg/dL
Specific Gravity, Urine: 1.02 (ref 1.005–1.030)
Urobilinogen, UA: 0.2 mg/dL (ref 0.0–1.0)
pH: 7 (ref 5.0–8.0)

## 2011-10-25 MED ORDER — HYDROXYPROGESTERONE CAPROATE 250 MG/ML IM OIL
250.0000 mg | TOPICAL_OIL | INTRAMUSCULAR | Status: DC
  Administered 2011-10-25: 250 mg via INTRAMUSCULAR

## 2011-10-25 NOTE — Progress Notes (Signed)
P=81, 

## 2011-10-25 NOTE — Progress Notes (Signed)
Patient doing well without complaints. Continue weekly 17-p

## 2011-10-25 NOTE — Addendum Note (Signed)
Addended by: Faythe Casa on: 10/25/2011 11:13 AM   Modules accepted: Orders

## 2011-11-01 ENCOUNTER — Ambulatory Visit (INDEPENDENT_AMBULATORY_CARE_PROVIDER_SITE_OTHER): Payer: Medicaid Other | Admitting: *Deleted

## 2011-11-01 VITALS — BP 107/59 | HR 95 | Wt 129.9 lb

## 2011-11-01 DIAGNOSIS — Z8751 Personal history of pre-term labor: Secondary | ICD-10-CM

## 2011-11-01 DIAGNOSIS — O09219 Supervision of pregnancy with history of pre-term labor, unspecified trimester: Secondary | ICD-10-CM

## 2011-11-01 MED ORDER — HYDROXYPROGESTERONE CAPROATE 250 MG/ML IM OIL
250.0000 mg | TOPICAL_OIL | INTRAMUSCULAR | Status: DC
  Administered 2011-11-01: 250 mg via INTRAMUSCULAR

## 2011-11-08 ENCOUNTER — Ambulatory Visit (INDEPENDENT_AMBULATORY_CARE_PROVIDER_SITE_OTHER): Payer: Medicaid Other | Admitting: Obstetrics and Gynecology

## 2011-11-08 VITALS — BP 108/71 | Temp 97.5°F | Wt 134.0 lb

## 2011-11-08 DIAGNOSIS — O09219 Supervision of pregnancy with history of pre-term labor, unspecified trimester: Secondary | ICD-10-CM

## 2011-11-08 DIAGNOSIS — O9989 Other specified diseases and conditions complicating pregnancy, childbirth and the puerperium: Secondary | ICD-10-CM

## 2011-11-08 DIAGNOSIS — Z283 Underimmunization status: Secondary | ICD-10-CM

## 2011-11-08 DIAGNOSIS — O0990 Supervision of high risk pregnancy, unspecified, unspecified trimester: Secondary | ICD-10-CM

## 2011-11-08 DIAGNOSIS — Z3490 Encounter for supervision of normal pregnancy, unspecified, unspecified trimester: Secondary | ICD-10-CM

## 2011-11-08 DIAGNOSIS — Z8751 Personal history of pre-term labor: Secondary | ICD-10-CM

## 2011-11-08 LAB — POCT URINALYSIS DIP (DEVICE)
Glucose, UA: 100 mg/dL — AB
Hgb urine dipstick: NEGATIVE
Nitrite: NEGATIVE
Protein, ur: NEGATIVE mg/dL
Specific Gravity, Urine: >=1.03 (ref 1.005–1.030)
Urobilinogen, UA: 0.2 mg/dL (ref 0.0–1.0)
pH: 5.5 (ref 5.0–8.0)

## 2011-11-08 MED ORDER — HYDROXYPROGESTERONE CAPROATE 250 MG/ML IM OIL
250.0000 mg | TOPICAL_OIL | INTRAMUSCULAR | Status: DC
  Administered 2011-11-08: 250 mg via INTRAMUSCULAR

## 2011-11-08 MED ORDER — TUBERCULIN PPD 5 UNIT/0.1ML ID SOLN: 5.0000 [IU] | Freq: Once | INTRADERMAL | Status: DC

## 2011-11-08 NOTE — Progress Notes (Signed)
Patient doing well without complaints. Admits to smoking marijuana and consuming ETOH until august and had concerns on their effect on fetal development. Patient denies any alcohol consumption or marijuana use since she found out she was pregnant. Continue weekly 17-p. Flu shot today. Patient desires TB testing

## 2011-11-08 NOTE — Progress Notes (Signed)
Pulse 78 Declines flu vaccine

## 2011-11-08 NOTE — Addendum Note (Signed)
Addended by: Freddi Starr on: 11/08/2011 12:09 PM   Modules accepted: Orders

## 2011-11-15 ENCOUNTER — Ambulatory Visit (INDEPENDENT_AMBULATORY_CARE_PROVIDER_SITE_OTHER): Payer: Medicaid Other | Admitting: *Deleted

## 2011-11-15 VITALS — BP 102/60 | HR 86 | Temp 98.0°F | Wt 135.3 lb

## 2011-11-15 DIAGNOSIS — O09899 Supervision of other high risk pregnancies, unspecified trimester: Secondary | ICD-10-CM

## 2011-11-15 DIAGNOSIS — O09219 Supervision of pregnancy with history of pre-term labor, unspecified trimester: Secondary | ICD-10-CM

## 2011-11-15 LAB — POCT URINALYSIS DIP (DEVICE)
Bilirubin Urine: NEGATIVE
Ketones, ur: NEGATIVE mg/dL
Nitrite: NEGATIVE
Protein, ur: 30 mg/dL — AB

## 2011-11-15 MED ORDER — HYDROXYPROGESTERONE CAPROATE 250 MG/ML IM OIL
250.0000 mg | TOPICAL_OIL | Freq: Once | INTRAMUSCULAR | Status: AC
  Administered 2011-11-15: 250 mg via INTRAMUSCULAR

## 2011-11-22 ENCOUNTER — Ambulatory Visit (INDEPENDENT_AMBULATORY_CARE_PROVIDER_SITE_OTHER): Payer: Medicaid Other | Admitting: General Practice

## 2011-11-22 VITALS — BP 116/64 | HR 96 | Temp 97.6°F | Ht 59.0 in | Wt 137.1 lb

## 2011-11-22 DIAGNOSIS — O09219 Supervision of pregnancy with history of pre-term labor, unspecified trimester: Secondary | ICD-10-CM

## 2011-11-22 MED ORDER — HYDROXYPROGESTERONE CAPROATE 250 MG/ML IM OIL
250.0000 mg | TOPICAL_OIL | Freq: Once | INTRAMUSCULAR | Status: AC
  Administered 2011-11-22: 250 mg via INTRAMUSCULAR

## 2011-11-29 ENCOUNTER — Ambulatory Visit (INDEPENDENT_AMBULATORY_CARE_PROVIDER_SITE_OTHER): Payer: Medicaid Other | Admitting: Obstetrics and Gynecology

## 2011-11-29 ENCOUNTER — Encounter: Payer: Medicaid Other | Admitting: Obstetrics and Gynecology

## 2011-11-29 VITALS — BP 117/72 | Temp 97.0°F | Wt 139.0 lb

## 2011-11-29 DIAGNOSIS — Z8751 Personal history of pre-term labor: Secondary | ICD-10-CM

## 2011-11-29 DIAGNOSIS — O0990 Supervision of high risk pregnancy, unspecified, unspecified trimester: Secondary | ICD-10-CM

## 2011-11-29 DIAGNOSIS — Z283 Underimmunization status: Secondary | ICD-10-CM

## 2011-11-29 DIAGNOSIS — O9989 Other specified diseases and conditions complicating pregnancy, childbirth and the puerperium: Secondary | ICD-10-CM

## 2011-11-29 DIAGNOSIS — O09219 Supervision of pregnancy with history of pre-term labor, unspecified trimester: Secondary | ICD-10-CM

## 2011-11-29 LAB — POCT URINALYSIS DIP (DEVICE)
Glucose, UA: 100 mg/dL — AB
Ketones, ur: NEGATIVE mg/dL
Specific Gravity, Urine: 1.025 (ref 1.005–1.030)
Urobilinogen, UA: 0.2 mg/dL (ref 0.0–1.0)

## 2011-11-29 MED ORDER — HYDROXYPROGESTERONE CAPROATE 250 MG/ML IM OIL
250.0000 mg | TOPICAL_OIL | INTRAMUSCULAR | Status: DC
  Administered 2011-11-29: 250 mg via INTRAMUSCULAR

## 2011-11-29 NOTE — Addendum Note (Signed)
Addended by: Freddi Starr on: 11/29/2011 10:31 AM   Modules accepted: Orders

## 2011-11-29 NOTE — Progress Notes (Signed)
Pulse- 94 

## 2011-11-29 NOTE — Progress Notes (Signed)
Patient without complaints. FM/PTL precautions reviewed. Weekly 17-p. Declined flu shot last week and this week. 1hr GCT at next visit

## 2011-12-06 ENCOUNTER — Ambulatory Visit (INDEPENDENT_AMBULATORY_CARE_PROVIDER_SITE_OTHER): Payer: Medicaid Other | Admitting: *Deleted

## 2011-12-06 VITALS — BP 128/64 | HR 96 | Temp 97.5°F | Wt 141.5 lb

## 2011-12-06 DIAGNOSIS — Z8751 Personal history of pre-term labor: Secondary | ICD-10-CM

## 2011-12-06 DIAGNOSIS — O09219 Supervision of pregnancy with history of pre-term labor, unspecified trimester: Secondary | ICD-10-CM

## 2011-12-06 MED ORDER — HYDROXYPROGESTERONE CAPROATE 250 MG/ML IM OIL
250.0000 mg | TOPICAL_OIL | Freq: Once | INTRAMUSCULAR | Status: AC
  Administered 2011-12-06: 250 mg via INTRAMUSCULAR

## 2011-12-11 ENCOUNTER — Inpatient Hospital Stay (HOSPITAL_COMMUNITY)
Admission: AD | Admit: 2011-12-11 | Discharge: 2011-12-11 | Disposition: A | Discharge status: Home / Self Care | Payer: Medicaid Other | Source: Ambulatory Visit | Attending: Obstetrics & Gynecology | Admitting: Obstetrics & Gynecology

## 2011-12-11 ENCOUNTER — Encounter (HOSPITAL_COMMUNITY): Payer: Self-pay

## 2011-12-11 DIAGNOSIS — O99891 Other specified diseases and conditions complicating pregnancy: Secondary | ICD-10-CM | POA: Insufficient documentation

## 2011-12-11 DIAGNOSIS — Z2839 Other underimmunization status: Secondary | ICD-10-CM

## 2011-12-11 DIAGNOSIS — R42 Dizziness and giddiness: Secondary | ICD-10-CM

## 2011-12-11 DIAGNOSIS — O9989 Other specified diseases and conditions complicating pregnancy, childbirth and the puerperium: Secondary | ICD-10-CM

## 2011-12-11 DIAGNOSIS — H538 Other visual disturbances: Secondary | ICD-10-CM | POA: Insufficient documentation

## 2011-12-11 DIAGNOSIS — Z283 Underimmunization status: Secondary | ICD-10-CM

## 2011-12-11 LAB — URINALYSIS, ROUTINE W REFLEX MICROSCOPIC
Glucose, UA: 250 mg/dL — AB
Nitrite: NEGATIVE
Protein, ur: NEGATIVE mg/dL

## 2011-12-11 LAB — CBC
HCT: 31.1 % — ABNORMAL LOW (ref 36.0–46.0)
MCH: 33.2 pg (ref 26.0–34.0)
MCHC: 34.7 g/dL (ref 30.0–36.0)
MCV: 95.7 fL (ref 78.0–100.0)
RDW: 13.6 % (ref 11.5–15.5)
WBC: 6.5 10*3/uL (ref 4.0–10.5)

## 2011-12-11 LAB — URINE MICROSCOPIC-ADD ON

## 2011-12-11 LAB — GLUCOSE, CAPILLARY: Glucose-Capillary: 96 mg/dL (ref 70–99)

## 2011-12-11 NOTE — MAU Provider Note (Signed)
History     CSN: [id number]  Arrival date and time: 12/11/11 0900   First Provider Initiated Contact with Patient 12/11/11 1005      Chief Complaint  Patient presents with  . 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  She went to the restroom to attempt to collect herself, but upon returning, she started to develop blurred vision and had to sit down.  Dizziness and light-headedness was not relieved, so she reported to MAU for treatment.  Patient states that similar episodes have been occuring over the past 2 months.  They have increased in intensity over the past week.  No aggravating or alleviating factors reported.  Episodes are slow to resolve and may last for more than 10 minutes once one begins.  Patient states she drinks 8 glasses of water a day and eats 4 meals and snacks.  She reports no previous history of blood pressure issues, but mother has HTN.   Past Medical History  Diagnosis Date  . Eczema   . Anxiety   . Depression     Past Surgical History  Procedure Date  . No past surgeries     Family History  Problem Relation Age of Onset  . Diabetes Mother   . Thyroid disease Sister     History  Substance Use Topics  . Smoking status: Former Smoker    Types: Cigarettes  . Smokeless tobacco: Never Used  . Alcohol Use: No    Allergies:  Allergies  Allergen Reactions  . Penicillins Rash    Prescriptions prior to admission  Medication Sig Dispense Refill  . Prenatal Vit-Fe Fumarate-FA (PRENATAL MULTIVITAMIN) TABS Take 1 tablet by mouth daily.        Review of Systems  Constitutional: Positive for malaise/fatigue. Negative for fever, chills and diaphoresis.  HENT: Negative.   Eyes: Positive for blurred vision.  Respiratory: Negative for shortness of breath.   Cardiovascular: Positive for palpitations. Negative for chest pain.  Musculoskeletal:  Negative.   Skin: Negative.   Neurological: Positive for dizziness and weakness. Negative for tingling.   Physical Exam   Blood pressure 100/52, pulse 84, temperature 98.6 F (37 C), temperature source Oral, resp. rate 18, last menstrual period 06/08/2011.  Physical Exam  Constitutional: She appears well-developed and well-nourished. No distress.  HENT:  Head: Normocephalic and atraumatic.  Eyes: Pupils are equal, round, and reactive to light.  Neck: Normal range of motion. Neck supple.  Cardiovascular: Normal rate, regular rhythm, normal heart sounds and intact distal pulses.   Respiratory: Effort normal and breath sounds normal. No respiratory distress. She has no wheezes. She has no rales.  GI: Soft. Bowel sounds are normal. There is no tenderness. There is no rebound and no guarding.  Skin: Skin is warm and dry. She is not diaphoretic.  Psychiatric: She has a normal mood and affect. Her behavior is normal. Judgment and thought content normal.    MAU Course  Procedures  Assessment and Plan   21 yo [id number] at [redacted]w[redacted]d with possible orthostatic hypotension  1) Order orthostatic BP and assess 2) Check CBC, UA, and assess volume status  If volume status low, start oral rehydration  Nira Conn 12/11/2011, 10:21 AM   Orthostatic VS normal. Labs reviewed. Discharge with syncope precautions, increase fluids. Keep appt at [facility] Thurs for 17-P and 1 hr glucola. Evaluation and management procedures were performed  by PA student under my supervision/collaboration. Chart reviewed, patient examined by me and I agree with management and plan. Danae Orleans, CNM 12/11/2011 11:35 AM

## 2011-12-11 NOTE — Progress Notes (Signed)
Lindsey Velazquez cnm notified of patient and her presenting complaints.

## 2011-12-11 NOTE — MAU Note (Signed)
Patient is a [facility] patient and gets 17p weekly, her next appt is on Thursday. She is in today with c/o lightheadness, feeling dizzy and hot while at work. She denies any pain, or vaginal bleeding or lof. She reports good fetal movement. She states that she have been drinking plenty of fluids this morning.

## 2011-12-11 NOTE — MAU Note (Signed)
Pt states has been having intermittent dizziness, feels like she could faint, "happens a lot".

## 2011-12-13 ENCOUNTER — Ambulatory Visit (INDEPENDENT_AMBULATORY_CARE_PROVIDER_SITE_OTHER): Payer: Medicaid Other | Admitting: Family Medicine

## 2011-12-13 ENCOUNTER — Encounter: Payer: Self-pay | Admitting: Family Medicine

## 2011-12-13 VITALS — BP 111/71 | Temp 97.5°F | Wt 142.2 lb

## 2011-12-13 DIAGNOSIS — Z8751 Personal history of pre-term labor: Secondary | ICD-10-CM

## 2011-12-13 DIAGNOSIS — O09219 Supervision of pregnancy with history of pre-term labor, unspecified trimester: Secondary | ICD-10-CM

## 2011-12-13 LAB — POCT URINALYSIS DIP (DEVICE)
Bilirubin Urine: NEGATIVE
Glucose, UA: 100 mg/dL — AB
Hgb urine dipstick: NEGATIVE
Protein, ur: 30 mg/dL — AB
Specific Gravity, Urine: 1.025 (ref 1.005–1.030)
Urobilinogen, UA: 0.2 mg/dL (ref 0.0–1.0)

## 2011-12-13 MED ORDER — HYDROXYPROGESTERONE CAPROATE 250 MG/ML IM OIL
250.0000 mg | TOPICAL_OIL | Freq: Once | INTRAMUSCULAR | Status: AC
  Administered 2011-12-13: 250 mg via INTRAMUSCULAR

## 2011-12-13 MED ORDER — TETANUS-DIPHTH-ACELL PERTUSSIS 5-2.5-18.5 LF-MCG/0.5 IM SUSP
0.5000 mL | Freq: Once | INTRAMUSCULAR | Status: AC
  Administered 2011-12-13: 0.5 mL via INTRAMUSCULAR

## 2011-12-13 NOTE — Progress Notes (Signed)
Pulse: 86 1hr gtt today due at 1016

## 2011-12-13 NOTE — Patient Instructions (Signed)
Contraception Choices Contraception (birth control) is the use of any methods or devices to prevent pregnancy. Below are some methods to help avoid pregnancy. HORMONAL METHODS   Contraceptive implant. This is a thin, plastic tube containing progesterone hormone. It does not contain estrogen hormone. Your caregiver inserts the tube in the inner part of the upper arm. The tube can remain in place for up to 3 years. After 3 years, the implant must be removed. The implant prevents the ovaries from releasing an egg (ovulation), thickens the cervical mucus which prevents sperm from entering the uterus, and thins the lining of the inside of the uterus.  Progesterone-only injections. These injections are given every 3 months by your caregiver to prevent pregnancy. This synthetic progesterone hormone stops the ovaries from releasing eggs. It also thickens cervical mucus and changes the uterine lining. This makes it harder for sperm to survive in the uterus.  Birth control pills. These pills contain estrogen and progesterone hormone. They work by stopping the egg from forming in the ovary (ovulation). Birth control pills are prescribed by a caregiver.Birth control pills can also be used to treat heavy periods.  Minipill. This type of birth control pill contains only the progesterone hormone. They are taken every day of each month and must be prescribed by your caregiver.  Birth control patch. The patch contains hormones similar to those in birth control pills. It must be changed once a week and is prescribed by a caregiver.  Vaginal ring. The ring contains hormones similar to those in birth control pills. It is left in the vagina for 3 weeks, removed for 1 week, and then a new one is put back in place. The patient must be comfortable inserting and removing the ring from the vagina.A caregiver's prescription is necessary.  Emergency contraception. Emergency contraceptives prevent pregnancy after unprotected  sexual intercourse. This pill can be taken right after sex or up to 5 days after unprotected sex. It is most effective the sooner you take the pills after having sexual intercourse. Emergency contraceptive pills are available without a prescription. Check with your pharmacist. Do not use emergency contraception as your only form of birth control. BARRIER METHODS   Female condom. This is a thin sheath (latex or rubber) that is worn over the penis during sexual intercourse. It can be used with spermicide to increase effectiveness.  Female condom. This is a soft, loose-fitting sheath that is put into the vagina before sexual intercourse.  Diaphragm. This is a soft, latex, dome-shaped barrier that must be fitted by a caregiver. It is inserted into the vagina, along with a spermicidal jelly. It is inserted before intercourse. The diaphragm should be left in the vagina for 6 to 8 hours after intercourse.  Cervical cap. This is a round, soft, latex or plastic cup that fits over the cervix and must be fitted by a caregiver. The cap can be left in place for up to 48 hours after intercourse.  Sponge. This is a soft, circular piece of polyurethane foam. The sponge has spermicide in it. It is inserted into the vagina after wetting it and before sexual intercourse.  Spermicides. These are chemicals that kill or block sperm from entering the cervix and uterus. They come in the form of creams, jellies, suppositories, foam, or tablets. They do not require a prescription. They are inserted into the vagina with an applicator before having sexual intercourse. The process must be repeated every time you have sexual intercourse. INTRAUTERINE CONTRACEPTION  Intrauterine device (  IUD). This is a T-shaped device that is put in a woman's uterus during a menstrual period to prevent pregnancy. There are 2 types:  Copper IUD. This type of IUD is wrapped in copper wire and is placed inside the uterus. Copper makes the uterus and  fallopian tubes produce a fluid that kills sperm. It can stay in place for 10 years.  Hormone IUD. This type of IUD contains the hormone progestin (synthetic progesterone). The hormone thickens the cervical mucus and prevents sperm from entering the uterus, and it also thins the uterine lining to prevent implantation of a fertilized egg. The hormone can weaken or kill the sperm that get into the uterus. It can stay in place for 5 years. PERMANENT METHODS OF CONTRACEPTION  Female tubal ligation. This is when the woman's fallopian tubes are surgically sealed, tied, or blocked to prevent the egg from traveling to the uterus.  Female sterilization. This is when the female has the tubes that carry sperm tied off (vasectomy).This blocks sperm from entering the vagina during sexual intercourse. After the procedure, the man can still ejaculate fluid (semen). NATURAL PLANNING METHODS  Natural family planning. This is not having sexual intercourse or using a barrier method (condom, diaphragm, cervical cap) on days the woman could become pregnant.  Calendar method. This is keeping track of the length of each menstrual cycle and identifying when you are fertile.  Ovulation method. This is avoiding sexual intercourse during ovulation.  Symptothermal method. This is avoiding sexual intercourse during ovulation, using a thermometer and ovulation symptoms.  Post-ovulation method. This is timing sexual intercourse after you have ovulated. Regardless of which type or method of contraception you choose, it is important that you use condoms to protect against the transmission of sexually transmitted diseases (STDs). Talk with your caregiver about which form of contraception is most appropriate for you. Document Released: 01/29/2005 Document Revised: 04/23/2011 Document Reviewed: 06/07/2010 [employer] Patient Information 2013 [employer]. Breastfeeding Deciding to breastfeed is one of the best choices you can make  for you and your baby. The information that follows gives a brief overview of the benefits of breastfeeding as well as common topics surrounding breastfeeding. BENEFITS OF BREASTFEEDING For the baby  The first milk (colostrum) helps the baby's digestive system function better.   There are antibodies in the mother's milk that help the baby fight off infections.   The baby has a lower incidence of asthma, allergies, and sudden infant death syndrome (SIDS).   The nutrients in breast milk are better for the baby than infant formulas, and breast milk helps the baby's brain grow better.   Babies who breastfeed have less gas, colic, and constipation.  For the mother  Breastfeeding helps develop a very special bond between the mother and her baby.   Breastfeeding is convenient, always available at the correct temperature, and costs nothing.   Breastfeeding burns calories in the mother and helps her lose weight that was gained during pregnancy.   Breastfeeding makes the uterus contract back down to normal size faster and slows bleeding following delivery.   Breastfeeding mothers have a lower risk of developing breast cancer.  BREASTFEEDING FREQUENCY  A healthy, full-term baby may breastfeed as often as every hour or space his or her feedings to every 3 hours.   Watch your baby for signs of hunger. Nurse your baby if he or she shows signs of hunger. How often you nurse will vary from baby to baby.   Nurse as often as   the baby requests, or when you feel the need to reduce the fullness of your breasts.   Awaken the baby if it has been 3 4 hours since the last feeding.   Frequent feeding will help the mother make more milk and will help prevent problems, such as sore nipples and engorgement of the breasts.  BABY'S POSITION AT THE BREAST  Whether lying down or sitting, be sure that the baby's tummy is facing your tummy.   Support the breast with 4 fingers underneath the  breast and the thumb above. Make sure your fingers are well away from the nipple and baby's mouth.   Stroke the baby's lips gently with your finger or nipple.   When the baby's mouth is open wide enough, place all of your nipple and as much of the areola as possible into your baby's mouth.   Pull the baby in close so the tip of the nose and the baby's cheeks touch the breast during the feeding.  FEEDINGS AND SUCTION  The length of each feeding varies from baby to baby and from feeding to feeding.   The baby must suck about 2 3 minutes for your milk to get to him or her. This is called a "let down." For this reason, allow the baby to feed on each breast as long as he or she wants. Your baby will end the feeding when he or she has received the right balance of nutrients.   To break the suction, put your finger into the corner of the baby's mouth and slide it between his or her gums before removing your breast from his or her mouth. This will help prevent sore nipples.  HOW TO TELL WHETHER YOUR BABY IS GETTING ENOUGH BREAST MILK. Wondering whether or not your baby is getting enough milk is a common concern among mothers. You can be assured that your baby is getting enough milk if:   Your baby is actively sucking and you hear swallowing.   Your baby seems relaxed and satisfied after a feeding.   Your baby nurses at least 8 12 times in a 24 hour time period. Nurse your baby until he or she unlatches or falls asleep at the first breast (at least 10 20 minutes), then offer the second side.   Your baby is wetting 5 6 disposable diapers (6 8 cloth diapers) in a 24 hour period by 5 6 days of age.   Your baby is having at least 3 4 stools every 24 hours for the first 6 weeks. The stool should be soft and yellow.   Your baby should gain 4 7 ounces per week after he or she is 4 days old.   Your breasts feel softer after nursing.  REDUCING BREAST ENGORGEMENT  In the first week after  your baby is born, you may experience signs of breast engorgement. When breasts are engorged, they feel heavy, warm, full, and may be tender to the touch. You can reduce engorgement if you:   Nurse frequently, every 2 3 hours. Mothers who breastfeed early and often have fewer problems with engorgement.   Place light ice packs on your breasts for 10 20 minutes between feedings. This reduces swelling. Wrap the ice packs in a lightweight towel to protect your skin. Bags of frozen vegetables work well for this purpose.   Take a warm shower or apply warm, moist heat to your breast for 5 10 minutes just before each feeding. This increases circulation and helps the   milk flow.   Gently massage your breast before and during the feeding. Using your finger tips, massage from the chest wall towards your nipple in a circular motion.   Make sure that the baby empties at least one breast at every feeding before switching sides.   Use a breast pump to empty the breasts if your baby is sleepy or not nursing well. You may also want to pump if you are returning to work oryou feel you are getting engorged.   Avoid bottle feeds, pacifiers, or supplemental feedings of water or juice in place of breastfeeding. Breast milk is all the food your baby needs. It is not necessary for your baby to have water or formula. In fact, to help your breasts make more milk, it is best not to give your baby supplemental feedings during the early weeks.   Be sure the baby is latched on and positioned properly while breastfeeding.   Wear a supportive bra, avoiding underwire styles.   Eat a balanced diet with enough fluids.   Rest often, relax, and take your prenatal vitamins to prevent fatigue, stress, and anemia.  If you follow these suggestions, your engorgement should improve in 24 48 hours. If you are still experiencing difficulty, call your lactation consultant or caregiver.  CARING FOR YOURSELF Take care of your  breasts  Bathe or shower daily.   Avoid using soap on your nipples.   Start feedings on your left breast at one feeding and on your right breast at the next feeding.   You will notice an increase in your milk supply 2 5 days after delivery. You may feel some discomfort from engorgement, which makes your breasts very firm and often tender. Engorgement "peaks" out within 24 48 hours. In the meantime, apply warm moist towels to your breasts for 5 10 minutes before feeding. Gentle massage and expression of some milk before feeding will soften your breasts, making it easier for your baby to latch on.   Wear a well-fitting nursing bra, and air dry your nipples for a 3 4minutes after each feeding.   Only use cotton bra pads.   Only use pure lanolin on your nipples after nursing. You do not need to wash it off before feeding the baby again. Another option is to express a few drops of breast milk and gently massage it into your nipples.  Take care of yourself  Eat well-balanced meals and nutritious snacks.   Drinking milk, fruit juice, and water to satisfy your thirst (about 8 glasses a day).   Get plenty of rest.  Avoid foods that you notice affect the baby in a bad way.  SEEK MEDICAL CARE IF:   You have difficulty with breastfeeding and need help.   You have a hard, red, sore area on your breast that is accompanied by a fever.   Your baby is too sleepy to eat well or is having trouble sleeping.   Your baby is wetting less than 6 diapers a day, by 5 days of age.   Your baby's skin or white part of his or her eyes is more yellow than it was in the hospital.   You feel depressed.  Document Released: 01/29/2005 Document Revised: 07/31/2011 Document Reviewed: 04/29/2011 [employer] Patient Information 2013 [employer].  

## 2011-12-13 NOTE — Progress Notes (Signed)
28 wk labs today 17 P today

## 2011-12-14 LAB — GLUCOSE TOLERANCE, 1 HOUR (50G) W/O FASTING: Glucose, 1 Hour GTT: 83 mg/dL (ref 70–140)

## 2011-12-17 ENCOUNTER — Ambulatory Visit (INDEPENDENT_AMBULATORY_CARE_PROVIDER_SITE_OTHER): Payer: Medicaid Other | Admitting: Family Medicine

## 2011-12-17 VITALS — BP 113/74 | Temp 98.1°F | Wt 141.9 lb

## 2011-12-17 DIAGNOSIS — J069 Acute upper respiratory infection, unspecified: Secondary | ICD-10-CM

## 2011-12-17 MED ORDER — PSEUDOEPHEDRINE-GUAIFENESIN ER 120-1200 MG PO TB12: 1.0000 | ORAL_TABLET | Freq: Two times a day (BID) | ORAL | Status: DC

## 2011-12-17 MED ORDER — CETIRIZINE HCL 10 MG PO TABS: 10.0000 mg | ORAL_TABLET | Freq: Every day | ORAL | Status: DC

## 2011-12-17 MED ORDER — DIPHENHYDRAMINE HCL 25 MG PO CAPS: 25.0000 mg | ORAL_CAPSULE | Freq: Four times a day (QID) | ORAL | Status: DC | PRN

## 2011-12-17 MED ORDER — GUAIFENESIN ER 600 MG PO TB12: 600.0000 mg | ORAL_TABLET | Freq: Two times a day (BID) | ORAL | Status: DC

## 2011-12-17 NOTE — Addendum Note (Signed)
Addended by: Reva Bores on: 12/17/2011 11:10 AM   Modules accepted: Orders

## 2011-12-17 NOTE — Progress Notes (Signed)
P = 91 Pt states sore throat x 2 days with dry cough, denies nasal congestion. No fevers.

## 2011-12-17 NOTE — Patient Instructions (Signed)
Breastfeeding Deciding to breastfeed is one of the best choices you can make for you and your baby. The information that follows gives a brief overview of the benefits of breastfeeding as well as common topics surrounding breastfeeding. BENEFITS OF BREASTFEEDING For the baby  The first milk (colostrum) helps the baby's digestive system function better.   There are antibodies in the mother's milk that help the baby fight off infections.   The baby has a lower incidence of asthma, allergies, and sudden infant death syndrome (SIDS).   The nutrients in breast milk are better for the baby than infant formulas, and breast milk helps the baby's brain grow better.   Babies who breastfeed have less gas, colic, and constipation.  For the mother  Breastfeeding helps develop a very special bond between the mother and her baby.   Breastfeeding is convenient, always available at the correct temperature, and costs nothing.   Breastfeeding burns calories in the mother and helps her lose weight that was gained during pregnancy.   Breastfeeding makes the uterus contract back down to normal size faster and slows bleeding following delivery.   Breastfeeding mothers have a lower risk of developing breast cancer.  BREASTFEEDING FREQUENCY  A healthy, full-term baby may breastfeed as often as every hour or space his or her feedings to every 3 hours.   Watch your baby for signs of hunger. Nurse your baby if he or she shows signs of hunger. How often you nurse will vary from baby to baby.   Nurse as often as the baby requests, or when you feel the need to reduce the fullness of your breasts.   Awaken the baby if it has been 3 4 hours since the last feeding.   Frequent feeding will help the mother make more milk and will help prevent problems, such as sore nipples and engorgement of the breasts.  BABY'S POSITION AT THE BREAST  Whether lying down or sitting, be sure that the baby's tummy is  facing your tummy.   Support the breast with 4 fingers underneath the breast and the thumb above. Make sure your fingers are well away from the nipple and baby's mouth.   Stroke the baby's lips gently with your finger or nipple.   When the baby's mouth is open wide enough, place all of your nipple and as much of the areola as possible into your baby's mouth.   Pull the baby in close so the tip of the nose and the baby's cheeks touch the breast during the feeding.  FEEDINGS AND SUCTION  The length of each feeding varies from baby to baby and from feeding to feeding.   The baby must suck about 2 3 minutes for your milk to get to him or her. This is called a "let down." For this reason, allow the baby to feed on each breast as long as he or she wants. Your baby will end the feeding when he or she has received the right balance of nutrients.   To break the suction, put your finger into the corner of the baby's mouth and slide it between his or her gums before removing your breast from his or her mouth. This will help prevent sore nipples.  HOW TO TELL WHETHER YOUR BABY IS GETTING ENOUGH BREAST MILK. Wondering whether or not your baby is getting enough milk is a common concern among mothers. You can be assured that your baby is getting enough milk if:   Your baby is actively   sucking and you hear swallowing.   Your baby seems relaxed and satisfied after a feeding.   Your baby nurses at least 8 12 times in a 24 hour time period. Nurse your baby until he or she unlatches or falls asleep at the first breast (at least 10 20 minutes), then offer the second side.   Your baby is wetting 5 6 disposable diapers (6 8 cloth diapers) in a 24 hour period by 5 6 days of age.   Your baby is having at least 3 4 stools every 24 hours for the first 6 weeks. The stool should be soft and yellow.   Your baby should gain 4 7 ounces per week after he or she is 4 days old.   Your breasts feel softer  after nursing.  REDUCING BREAST ENGORGEMENT  In the first week after your baby is born, you may experience signs of breast engorgement. When breasts are engorged, they feel heavy, warm, full, and may be tender to the touch. You can reduce engorgement if you:   Nurse frequently, every 2 3 hours. Mothers who breastfeed early and often have fewer problems with engorgement.   Place light ice packs on your breasts for 10 20 minutes between feedings. This reduces swelling. Wrap the ice packs in a lightweight towel to protect your skin. Bags of frozen vegetables work well for this purpose.   Take a warm shower or apply warm, moist heat to your breast for 5 10 minutes just before each feeding. This increases circulation and helps the milk flow.   Gently massage your breast before and during the feeding. Using your finger tips, massage from the chest wall towards your nipple in a circular motion.   Make sure that the baby empties at least one breast at every feeding before switching sides.   Use a breast pump to empty the breasts if your baby is sleepy or not nursing well. You may also want to pump if you are returning to work oryou feel you are getting engorged.   Avoid bottle feeds, pacifiers, or supplemental feedings of water or juice in place of breastfeeding. Breast milk is all the food your baby needs. It is not necessary for your baby to have water or formula. In fact, to help your breasts make more milk, it is best not to give your baby supplemental feedings during the early weeks.   Be sure the baby is latched on and positioned properly while breastfeeding.   Wear a supportive bra, avoiding underwire styles.   Eat a balanced diet with enough fluids.   Rest often, relax, and take your prenatal vitamins to prevent fatigue, stress, and anemia.  If you follow these suggestions, your engorgement should improve in 24 48 hours. If you are still experiencing difficulty, call your  lactation consultant or caregiver.  CARING FOR YOURSELF Take care of your breasts  Bathe or shower daily.   Avoid using soap on your nipples.   Start feedings on your left breast at one feeding and on your right breast at the next feeding.   You will notice an increase in your milk supply 2 5 days after delivery. You may feel some discomfort from engorgement, which makes your breasts very firm and often tender. Engorgement "peaks" out within 24 48 hours. In the meantime, apply warm moist towels to your breasts for 5 10 minutes before feeding. Gentle massage and expression of some milk before feeding will soften your breasts, making it easier for your   baby to latch on.   Wear a well-fitting nursing bra, and air dry your nipples for a 3 4minutes after each feeding.   Only use cotton bra pads.   Only use pure lanolin on your nipples after nursing. You do not need to wash it off before feeding the baby again. Another option is to express a few drops of breast milk and gently massage it into your nipples.  Take care of yourself  Eat well-balanced meals and nutritious snacks.   Drinking milk, fruit juice, and water to satisfy your thirst (about 8 glasses a day).   Get plenty of rest.  Avoid foods that you notice affect the baby in a bad way.  SEEK MEDICAL CARE IF:   You have difficulty with breastfeeding and need help.   You have a hard, red, sore area on your breast that is accompanied by a fever.   Your baby is too sleepy to eat well or is having trouble sleeping.   Your baby is wetting less than 6 diapers a day, by 5 days of age.   Your baby's skin or white part of his or her eyes is more yellow than it was in the hospital.   You feel depressed.  Document Released: 01/29/2005 Document Revised: 07/31/2011 Document Reviewed: 04/29/2011 [employer] Patient Information 2013 [employer].  

## 2011-12-17 NOTE — Progress Notes (Signed)
Complains of sore throat and cough--her kids are sick--will try benadryl and Mucinex-D For 17-P on Thursday.

## 2011-12-20 ENCOUNTER — Ambulatory Visit (INDEPENDENT_AMBULATORY_CARE_PROVIDER_SITE_OTHER): Payer: Medicaid Other | Admitting: Medical

## 2011-12-20 VITALS — BP 121/77 | HR 102

## 2011-12-20 DIAGNOSIS — Z8751 Personal history of pre-term labor: Secondary | ICD-10-CM

## 2011-12-20 DIAGNOSIS — O09219 Supervision of pregnancy with history of pre-term labor, unspecified trimester: Secondary | ICD-10-CM

## 2011-12-20 MED ORDER — HYDROXYPROGESTERONE CAPROATE 250 MG/ML IM OIL
250.0000 mg | TOPICAL_OIL | Freq: Once | INTRAMUSCULAR | Status: AC
  Administered 2011-12-20: 250 mg via INTRAMUSCULAR

## 2011-12-27 ENCOUNTER — Ambulatory Visit (INDEPENDENT_AMBULATORY_CARE_PROVIDER_SITE_OTHER): Payer: Medicaid Other | Admitting: Obstetrics & Gynecology

## 2011-12-27 VITALS — BP 127/72 | Temp 97.3°F | Wt 145.5 lb

## 2011-12-27 DIAGNOSIS — O09219 Supervision of pregnancy with history of pre-term labor, unspecified trimester: Secondary | ICD-10-CM

## 2011-12-27 DIAGNOSIS — Z8751 Personal history of pre-term labor: Secondary | ICD-10-CM

## 2011-12-27 LAB — POCT URINALYSIS DIP (DEVICE)
Glucose, UA: 250 mg/dL — AB
Nitrite: NEGATIVE
Specific Gravity, Urine: 1.025 (ref 1.005–1.030)
Urobilinogen, UA: 0.2 mg/dL (ref 0.0–1.0)

## 2011-12-27 MED ORDER — HYDROXYPROGESTERONE CAPROATE 250 MG/ML IM OIL
250.0000 mg | TOPICAL_OIL | Freq: Once | INTRAMUSCULAR | Status: AC
  Administered 2011-12-27: 250 mg via INTRAMUSCULAR

## 2011-12-27 NOTE — Progress Notes (Signed)
No contractions. Feels better after cold symptoms.

## 2011-12-27 NOTE — Progress Notes (Signed)
Pulse 91 Patient reports some hip pain

## 2011-12-27 NOTE — Patient Instructions (Addendum)

## 2011-12-27 NOTE — Addendum Note (Signed)
Addended by: Freddi Starr on: 12/27/2011 09:47 AM   Modules accepted: Orders

## 2012-01-03 ENCOUNTER — Encounter (HOSPITAL_COMMUNITY): Payer: Self-pay | Admitting: *Deleted

## 2012-01-03 ENCOUNTER — Inpatient Hospital Stay (HOSPITAL_COMMUNITY)
Admission: AD | Admit: 2012-01-03 | Discharge: 2012-01-03 | Disposition: A | Discharge status: Home / Self Care | Payer: Medicaid Other | Source: Ambulatory Visit | Attending: Obstetrics & Gynecology | Admitting: Obstetrics & Gynecology

## 2012-01-03 ENCOUNTER — Ambulatory Visit (INDEPENDENT_AMBULATORY_CARE_PROVIDER_SITE_OTHER): Payer: Medicaid Other | Admitting: *Deleted

## 2012-01-03 VITALS — Wt 147.0 lb

## 2012-01-03 DIAGNOSIS — O0990 Supervision of high risk pregnancy, unspecified, unspecified trimester: Secondary | ICD-10-CM

## 2012-01-03 DIAGNOSIS — O4700 False labor before 37 completed weeks of gestation, unspecified trimester: Secondary | ICD-10-CM | POA: Insufficient documentation

## 2012-01-03 DIAGNOSIS — O09219 Supervision of pregnancy with history of pre-term labor, unspecified trimester: Secondary | ICD-10-CM

## 2012-01-03 DIAGNOSIS — R109 Unspecified abdominal pain: Secondary | ICD-10-CM | POA: Insufficient documentation

## 2012-01-03 DIAGNOSIS — M549 Dorsalgia, unspecified: Secondary | ICD-10-CM | POA: Insufficient documentation

## 2012-01-03 LAB — URINALYSIS, ROUTINE W REFLEX MICROSCOPIC
Glucose, UA: 100 mg/dL — AB
Leukocytes, UA: NEGATIVE
Nitrite: NEGATIVE
Protein, ur: NEGATIVE mg/dL
Urobilinogen, UA: 1 mg/dL (ref 0.0–1.0)

## 2012-01-03 LAB — WET PREP, GENITAL: Yeast Wet Prep HPF POC: NONE SEEN

## 2012-01-03 MED ORDER — BETAMETHASONE SOD PHOS & ACET 6 (3-3) MG/ML IJ SUSP
12.0000 mg | INTRAMUSCULAR | Status: AC
  Administered 2012-01-03: 12 mg via INTRAMUSCULAR
  Filled 2012-01-03: qty 2

## 2012-01-03 MED ORDER — HYDROXYPROGESTERONE CAPROATE 250 MG/ML IM OIL
250.0000 mg | TOPICAL_OIL | Freq: Once | INTRAMUSCULAR | Status: AC
  Administered 2012-01-03: 250 mg via INTRAMUSCULAR

## 2012-01-03 MED ORDER — LACTATED RINGERS IV BOLUS (SEPSIS)
1000.0000 mL | Freq: Once | INTRAVENOUS | Status: AC
  Administered 2012-01-03: 1000 mL via INTRAVENOUS

## 2012-01-03 MED ORDER — OXYCODONE-ACETAMINOPHEN 5-325 MG PO TABS
2.0000 | ORAL_TABLET | ORAL | Status: AC
  Administered 2012-01-03: 2 via ORAL
  Filled 2012-01-03: qty 2

## 2012-01-03 NOTE — MAU Note (Signed)
Back pain x 3 days - worse with sitting, standing & walking.  Lower abd pressure started today.  Denies bleeding or LOF.

## 2012-01-03 NOTE — MAU Provider Note (Signed)
Chief Complaint:  Back Pain   First Provider Initiated Contact with Patient 01/03/12 1856      HPI: Lindsey Velazquez is a 21 y.o. [id number] pt of [facility] at [redacted]w[redacted]d who presents to maternity admissions reporting constant lower abdominal pressure and intermittent back pain.  She has a history of one term, and one preterm vaginal delivery, at [redacted] weeks gestation.  She is seen in [facility], and receives 17P injections.  She reports good fetal movement, denies LOF, vaginal bleeding, vaginal itching/burning, urinary symptoms, h/a, dizziness, n/v, or fever/chills.     Past Medical History: Past Medical History  Diagnosis Date  . Eczema   . Anxiety   . Depression     Past obstetric history: OB History    Grav Para Term Preterm Abortions TAB SAB Ect Mult Living   4 2 1 1 1  0 1 0 0 2     # Outc Date GA Lbr Len/2nd Wgt Sex Del Anes PTL Lv   1 PRE 10/09 [redacted]w[redacted]d  3lb15oz(1.786kg) M SVD None  Yes   Comments: born [facility], had PTL, PTD, was on bedrest   2 TRM 8/11 [redacted]w[redacted]d  5lb8oz(2.495kg) F SVD None  Yes   Comments: born [facility], no complications   3 SAB 12/12 [redacted]w[redacted]d          Comments: System Generated. Please review and update pregnancy details.   4 CUR               Past Surgical History: Past Surgical History  Procedure Date  . No past surgeries     Family History: Family History  Problem Relation Age of Onset  . Diabetes Mother   . Thyroid disease Sister     Social History: History  Substance Use Topics  . Smoking status: Former Smoker    Types: Cigarettes  . Smokeless tobacco: Never Used  . Alcohol Use: No    Allergies:  Allergies  Allergen Reactions  . Penicillins Rash    Meds:  Prescriptions prior to admission  Medication Sig Dispense Refill  . Prenatal Vit-Fe Fumarate-FA (PRENATAL MULTIVITAMIN) TABS Take 1 tablet by mouth daily.        ROS: Pertinent findings in history of present illness.  Physical Exam  Blood pressure 111/61, pulse 87, temperature 97.9 F (36.6 C),  temperature source Oral, resp. rate 18, last menstrual period 06/08/2011. GENERAL: Well-developed, well-nourished female in no acute distress.  HEENT: normocephalic HEART: normal rate RESP: normal effort ABDOMEN: Soft, non-tender, gravid appropriate for gestational age EXTREMITIES: Nontender, no edema NEURO: alert and oriented SPECULUM EXAM: NEFG, physiologic discharge, no blood, cervix clean   Cervix 1/long/high, posterior, soft  FHT:  Baseline 135 , moderate variability, accelerations present (15x15), no decelerations Contractions: q 3-10 mins, irregular, following IV fluids, occasional ctx   Cervix unchanged, 1/long/high, posterior, soft, after 2 hours in MAU  Labs: Results for orders placed during the hospital encounter of 01/03/12 (from the past 24 hour(s))  URINALYSIS, ROUTINE W REFLEX MICROSCOPIC     Status: Abnormal   Collection Time   01/03/12  6:05 PM      Component Value Range   Color, Urine YELLOW  YELLOW   APPearance CLEAR  CLEAR   Specific Gravity, Urine >1.030 (*) 1.005 - 1.030   pH 6.0  5.0 - 8.0   Glucose, UA 100 (*) NEGATIVE mg/dL   Hgb urine dipstick NEGATIVE  NEGATIVE   Bilirubin Urine NEGATIVE  NEGATIVE   Ketones, ur 15 (*) NEGATIVE mg/dL  Protein, ur NEGATIVE  NEGATIVE mg/dL   Urobilinogen, UA 1.0  0.0 - 1.0 mg/dL   Nitrite NEGATIVE  NEGATIVE   Leukocytes, UA NEGATIVE  NEGATIVE  WET PREP, GENITAL     Status: Abnormal   Collection Time   01/03/12  6:57 PM      Component Value Range   Yeast Wet Prep HPF POC NONE SEEN  NONE SEEN   Trich, Wet Prep NONE SEEN  NONE SEEN   Clue Cells Wet Prep HPF POC NONE SEEN  NONE SEEN   WBC, Wet Prep HPF POC MANY (*) NONE SEEN  FETAL FIBRONECTIN     Status: Abnormal   Collection Time   01/03/12  6:57 PM      Component Value Range   Fetal Fibronectin POSITIVE (*) NEGATIVE    Assessment: 1. Threatened preterm labor     Plan: LR x1000 ml x1 dose  Betamethasone IM x1 dose now, return to MAU for second dose in 24  hours Percocet 5/325, 2 tabs x1 dose Discharge home with PTL precautions Rest, warm bath/heat, Tylenol for pain at home F/U in [facility] this week Return to MAU as needed    Medication List     As of 01/03/2012  7:10 PM    ASK your doctor about these medications         prenatal multivitamin Tabs   Take 1 tablet by mouth daily.        Sharen Counter Certified Nurse-Midwife 01/03/2012 7:10 PM

## 2012-01-04 ENCOUNTER — Inpatient Hospital Stay (HOSPITAL_COMMUNITY)
Admission: AD | Admit: 2012-01-04 | Discharge: 2012-01-04 | Disposition: A | Discharge status: Home / Self Care | Payer: Medicaid Other | Source: Ambulatory Visit | Attending: Obstetrics & Gynecology | Admitting: Obstetrics & Gynecology

## 2012-01-04 DIAGNOSIS — O26899 Other specified pregnancy related conditions, unspecified trimester: Secondary | ICD-10-CM

## 2012-01-04 DIAGNOSIS — M549 Dorsalgia, unspecified: Secondary | ICD-10-CM

## 2012-01-04 DIAGNOSIS — O4700 False labor before 37 completed weeks of gestation, unspecified trimester: Secondary | ICD-10-CM | POA: Insufficient documentation

## 2012-01-04 MED ORDER — CYCLOBENZAPRINE HCL 10 MG PO TABS
10.0000 mg | ORAL_TABLET | ORAL | Status: AC
  Administered 2012-01-04: 10 mg via ORAL
  Filled 2012-01-04: qty 1

## 2012-01-04 MED ORDER — CYCLOBENZAPRINE HCL 5 MG PO TABS: 5.0000 mg | ORAL_TABLET | Freq: Three times a day (TID) | ORAL | Status: DC | PRN

## 2012-01-04 MED ORDER — BETAMETHASONE SOD PHOS & ACET 6 (3-3) MG/ML IJ SUSP
12.0000 mg | Freq: Once | INTRAMUSCULAR | Status: AC
  Administered 2012-01-04: 12 mg via INTRAMUSCULAR
  Filled 2012-01-04: qty 2

## 2012-01-04 NOTE — MAU Provider Note (Signed)
S: 21 y.o. [id number]  @[redacted]w[redacted]d  presents to MAU for second bethamethasone injection.  She reports continuing back pain, keeping her from sleeping at night, but denies this has worsened since yesterday.  She feels good fetal movement and denies other symptoms.  O: BP 110/63  Pulse 87  Temp 97.4 F (36.3 C) (Oral)  Resp 20  LMP 06/08/2011  Cervix 1/long/high, posterior, unchanged from last visit  A: 1. Back pain complicating pregnancy      P: Flexeril 10 mg now D/C home Flexeril 5 mg TID PRN F[facility]U in [facility] Return to MAU as needed  Sharen Counter Certified Nurse-Midwife

## 2012-01-04 NOTE — MAU Note (Signed)
Here for 2nd betamethasone inj. States has been having back pain which is nothing new. Practioner last night mentioned may could get script for pain med for back.

## 2012-01-04 NOTE — Progress Notes (Signed)
Bonna Gains CNM in earlier and discussed plan of care. Written and verbal d/c instructions given and understanding voiced. A friend is driving for pt tonight

## 2012-01-07 NOTE — MAU Provider Note (Signed)
Attestation of Attending Supervision of Advanced Practitioner (CNM/NP): Evaluation and management procedures were performed by the Advanced Practitioner under my supervision and collaboration.  I have reviewed the Advanced Practitioner's note and chart, and I agree with the management and plan.  Taitum Menton, MD, FACOG Attending Obstetrician & Gynecologist Faculty Practice, [facility]  

## 2012-01-09 ENCOUNTER — Ambulatory Visit (INDEPENDENT_AMBULATORY_CARE_PROVIDER_SITE_OTHER): Payer: Medicaid Other | Admitting: Obstetrics & Gynecology

## 2012-01-09 VITALS — BP 112/70 | Temp 96.7°F | Wt 150.0 lb

## 2012-01-09 DIAGNOSIS — Z8751 Personal history of pre-term labor: Secondary | ICD-10-CM

## 2012-01-09 DIAGNOSIS — O0990 Supervision of high risk pregnancy, unspecified, unspecified trimester: Secondary | ICD-10-CM

## 2012-01-09 DIAGNOSIS — O09219 Supervision of pregnancy with history of pre-term labor, unspecified trimester: Secondary | ICD-10-CM

## 2012-01-09 LAB — POCT URINALYSIS DIP (DEVICE)
Bilirubin Urine: NEGATIVE
Glucose, UA: 100 mg/dL — AB
Hgb urine dipstick: NEGATIVE
Specific Gravity, Urine: 1.02 (ref 1.005–1.030)

## 2012-01-09 MED ORDER — HYDROXYPROGESTERONE CAPROATE 250 MG/ML IM OIL
250.0000 mg | TOPICAL_OIL | Freq: Once | INTRAMUSCULAR | Status: AC
  Administered 2012-01-09: 250 mg via INTRAMUSCULAR

## 2012-01-09 NOTE — Progress Notes (Signed)
P = 106 Occasional pressure in lower abdomen

## 2012-01-09 NOTE — Patient Instructions (Signed)
Return to clinic for any obstetric concerns or go to MAU for evaluation  

## 2012-01-09 NOTE — Progress Notes (Signed)
Seen in MAU on 11/21 with preterm contractions, FFN positive, exam 1cm dilation which was unchanged after several hours.  Given BMZ and sent home, returned on 11/22 to receive 2nd BMZ and had unchanged exam. Today, she reports rare contractions, no LOF, no VB. Good FM. Exam is unchanged. Continue weekly 17P. Work restrictions letter given to patient as per her request. No other complaints or concerns.  Fetal movement and labor precautions reviewed.

## 2012-01-13 NOTE — MAU Provider Note (Signed)
Attestation of Attending Supervision of Advanced Practitioner (CNM/NP): Evaluation and management procedures were performed by the Advanced Practitioner under my supervision and collaboration. I have reviewed the Advanced Practitioner's note and chart, and I agree with the management and plan.  Pattricia Weiher H. 1:37 PM   

## 2012-01-17 ENCOUNTER — Ambulatory Visit (INDEPENDENT_AMBULATORY_CARE_PROVIDER_SITE_OTHER): Payer: Medicaid Other | Admitting: Obstetrics & Gynecology

## 2012-01-17 ENCOUNTER — Ambulatory Visit: Payer: Medicaid Other | Admitting: Obstetrics & Gynecology

## 2012-01-17 VITALS — BP 125/79 | Temp 97.2°F | Wt 150.1 lb

## 2012-01-17 DIAGNOSIS — Z8751 Personal history of pre-term labor: Secondary | ICD-10-CM

## 2012-01-17 DIAGNOSIS — O09219 Supervision of pregnancy with history of pre-term labor, unspecified trimester: Secondary | ICD-10-CM

## 2012-01-17 DIAGNOSIS — O0990 Supervision of high risk pregnancy, unspecified, unspecified trimester: Secondary | ICD-10-CM

## 2012-01-17 MED ORDER — HYDROXYPROGESTERONE CAPROATE 250 MG/ML IM OIL
250.0000 mg | TOPICAL_OIL | Freq: Once | INTRAMUSCULAR | Status: AC
  Administered 2012-01-17: 250 mg via INTRAMUSCULAR

## 2012-01-17 NOTE — Patient Instructions (Signed)

## 2012-01-17 NOTE — Progress Notes (Signed)
Patient given contact information for [facility] but declined appointment for now as initial exam fee is $75 and then $3 copays with regular Medicaid.

## 2012-01-17 NOTE — Progress Notes (Signed)
P=98, Here originally for 17p only, but request to see doctor because of worsening pressure and pelvic pain and states hard to walk

## 2012-01-17 NOTE — Addendum Note (Signed)
Addended by: Gerome Apley on: 01/17/2012 09:30 AM   Modules accepted: Orders

## 2012-01-17 NOTE — Progress Notes (Signed)
Right lower back pain, some irregular contractions. No dysuria, no CVAT. Has Flexaril but no relief. Will refer to chiropractor for MSBP.

## 2012-01-23 ENCOUNTER — Encounter: Payer: Medicaid Other | Admitting: Advanced Practice Midwife

## 2012-01-24 ENCOUNTER — Encounter: Payer: Self-pay | Admitting: *Deleted

## 2012-01-24 ENCOUNTER — Ambulatory Visit (INDEPENDENT_AMBULATORY_CARE_PROVIDER_SITE_OTHER): Payer: Medicaid Other | Admitting: *Deleted

## 2012-01-24 VITALS — BP 119/75 | HR 99 | Temp 97.3°F | Ht 59.0 in | Wt 152.9 lb

## 2012-01-24 DIAGNOSIS — O09899 Supervision of other high risk pregnancies, unspecified trimester: Secondary | ICD-10-CM

## 2012-01-24 DIAGNOSIS — O09219 Supervision of pregnancy with history of pre-term labor, unspecified trimester: Secondary | ICD-10-CM

## 2012-01-24 MED ORDER — HYDROXYPROGESTERONE CAPROATE 250 MG/ML IM OIL
250.0000 mg | TOPICAL_OIL | Freq: Once | INTRAMUSCULAR | Status: AC
  Administered 2012-01-24: 250 mg via INTRAMUSCULAR

## 2012-01-28 NOTE — Progress Notes (Signed)
Erroneous encounter

## 2012-01-31 ENCOUNTER — Ambulatory Visit (INDEPENDENT_AMBULATORY_CARE_PROVIDER_SITE_OTHER): Payer: Medicaid Other | Admitting: Family

## 2012-01-31 VITALS — BP 109/72 | Temp 98.1°F | Wt 157.3 lb

## 2012-01-31 DIAGNOSIS — O368190 Decreased fetal movements, unspecified trimester, not applicable or unspecified: Secondary | ICD-10-CM

## 2012-01-31 DIAGNOSIS — Z8751 Personal history of pre-term labor: Secondary | ICD-10-CM

## 2012-01-31 DIAGNOSIS — O09219 Supervision of pregnancy with history of pre-term labor, unspecified trimester: Secondary | ICD-10-CM

## 2012-01-31 LAB — POCT URINALYSIS DIP (DEVICE)
Bilirubin Urine: NEGATIVE
Hgb urine dipstick: NEGATIVE
Ketones, ur: NEGATIVE mg/dL
Protein, ur: NEGATIVE mg/dL
pH: 6.5 (ref 5.0–8.0)

## 2012-01-31 MED ORDER — HYDROXYPROGESTERONE CAPROATE 250 MG/ML IM OIL
250.0000 mg | TOPICAL_OIL | Freq: Once | INTRAMUSCULAR | Status: AC
  Administered 2012-01-31: 250 mg via INTRAMUSCULAR

## 2012-01-31 NOTE — Progress Notes (Signed)
Pulse: 97 States that she doesn't feel baby moving as much, has not felt movement today.

## 2012-01-31 NOTE — Progress Notes (Signed)
Patient reports feeling a lot of pressure and feeling heavy for about a week and a half- feels it all the time , including when at  Work. Also c/o has pain sometimes on left side of abdomen  Or under stomach.

## 2012-01-31 NOTE — Progress Notes (Signed)
NST - reactive; reports intermittent contractions, no bleeding or leaking of fluid.  17-p today, cervix 1/thick

## 2012-02-05 ENCOUNTER — Encounter (HOSPITAL_COMMUNITY): Payer: Self-pay

## 2012-02-05 ENCOUNTER — Inpatient Hospital Stay (HOSPITAL_COMMUNITY)
Admission: AD | Admit: 2012-02-05 | Discharge: 2012-02-05 | Disposition: A | Discharge status: Home / Self Care | Payer: Medicaid Other | Source: Ambulatory Visit | Attending: Obstetrics & Gynecology | Admitting: Obstetrics & Gynecology

## 2012-02-05 DIAGNOSIS — A499 Bacterial infection, unspecified: Secondary | ICD-10-CM | POA: Insufficient documentation

## 2012-02-05 DIAGNOSIS — B373 Candidiasis of vulva and vagina: Secondary | ICD-10-CM

## 2012-02-05 DIAGNOSIS — N760 Acute vaginitis: Secondary | ICD-10-CM

## 2012-02-05 DIAGNOSIS — IMO0002 Reserved for concepts with insufficient information to code with codable children: Secondary | ICD-10-CM | POA: Insufficient documentation

## 2012-02-05 DIAGNOSIS — R109 Unspecified abdominal pain: Secondary | ICD-10-CM | POA: Insufficient documentation

## 2012-02-05 DIAGNOSIS — B9689 Other specified bacterial agents as the cause of diseases classified elsewhere: Secondary | ICD-10-CM | POA: Insufficient documentation

## 2012-02-05 DIAGNOSIS — O2390 Unspecified genitourinary tract infection in pregnancy, unspecified trimester: Secondary | ICD-10-CM | POA: Insufficient documentation

## 2012-02-05 DIAGNOSIS — B3731 Acute candidiasis of vulva and vagina: Secondary | ICD-10-CM | POA: Insufficient documentation

## 2012-02-05 DIAGNOSIS — Z283 Underimmunization status: Secondary | ICD-10-CM

## 2012-02-05 DIAGNOSIS — O9989 Other specified diseases and conditions complicating pregnancy, childbirth and the puerperium: Secondary | ICD-10-CM

## 2012-02-05 LAB — URINALYSIS, ROUTINE W REFLEX MICROSCOPIC
Bilirubin Urine: NEGATIVE
Glucose, UA: 100 mg/dL — AB
Hgb urine dipstick: NEGATIVE
Specific Gravity, Urine: >1.03 — ABNORMAL HIGH (ref 1.005–1.030)
pH: 6 (ref 5.0–8.0)

## 2012-02-05 LAB — WET PREP, GENITAL: Trich, Wet Prep: NONE SEEN

## 2012-02-05 MED ORDER — FLUCONAZOLE 150 MG PO TABS: 150.0000 mg | ORAL_TABLET | Freq: Once | ORAL | Status: DC

## 2012-02-05 MED ORDER — METRONIDAZOLE 500 MG PO TABS: 500.0000 mg | ORAL_TABLET | Freq: Two times a day (BID) | ORAL | Status: AC

## 2012-02-05 NOTE — MAU Note (Signed)
Patient is in with c/o painful contractions that are intense when she walks. She denies vaginal bleeding or lof. She reports good fetal movement. Patient is on 17p injection weekly for history of preterm delivery, her next dose is due on Thursday.

## 2012-02-05 NOTE — MAU Provider Note (Signed)
History     CSN: [id number]  Arrival date and time: 02/05/12 1939   None     Chief Complaint  Patient presents with  . Abdominal Pain   HPI 21 y.o. [id number] at [redacted]w[redacted]d with swollen feet all day. Has not been on feet all day. Also c/o abdominal pain. Started on left side this morning, then became more like contractions, irregular, able to walk and talk through. Now feeling pressure low in pelvis when stands. No bleeding or loss of fluid. Baby moving well. No vaginal discharge or dysuria.  History of preterm birth (31w), on 17-P, due for shot in 2 days. Positive FFN on 01/03/12.   OB History    Grav Para Term Preterm Abortions TAB SAB Ect Mult Living   4 2 1 1 1  0 1 0 0 2      Past Medical History  Diagnosis Date  . Eczema   . Anxiety   . Depression     Past Surgical History  Procedure Date  . No past surgeries     Family History  Problem Relation Age of Onset  . Diabetes Mother   . Thyroid disease Sister     History  Substance Use Topics  . Smoking status: Former Smoker    Types: Cigarettes  . Smokeless tobacco: Never Used  . Alcohol Use: No    Allergies:  Allergies  Allergen Reactions  . Penicillins Rash    Prescriptions prior to admission  Medication Sig Dispense Refill  . acetaminophen (TYLENOL) 500 MG tablet Take 500 mg by mouth every 6 (six) hours as needed. For headache/pain      . cyclobenzaprine (FLEXERIL) 5 MG tablet Take 1 tablet (5 mg total) by mouth 3 (three) times daily as needed for muscle spasms.  30 tablet  0  . Prenatal Vit-Fe Fumarate-FA (PRENATAL MULTIVITAMIN) TABS Take 1 tablet by mouth daily.        Review of Systems  Constitutional: Negative for fever and chills.  Eyes: Negative for blurred vision and double vision.  Respiratory: Negative for shortness of breath.   Cardiovascular: Negative for chest pain.  Gastrointestinal: Negative for nausea, vomiting and diarrhea.  Genitourinary: Negative for dysuria and urgency.   Neurological: Negative for dizziness and headaches.   Physical Exam   Blood pressure 130/60, pulse 91, temperature 97.9 F (36.6 C), temperature source Oral, resp. rate 18, height 5' (1.524 m), weight 72.576 kg (160 lb), last menstrual period 06/08/2011, SpO2 100.00%.  Filed Vitals:   02/05/12 1946 02/05/12 2001 02/05/12 2015  BP: 130/60 125/62 110/62  Pulse: 91 85 86  Temp: 97.9 F (36.6 C)    TempSrc: Oral    Resp: 18    Height: 5' (1.524 m)    Weight: 72.576 kg (160 lb)    SpO2: 100%       Physical Exam  Constitutional: She is oriented to person, place, and time. She appears well-developed and well-nourished. No distress.  HENT:  Head: Normocephalic and atraumatic.  Eyes: Conjunctivae normal and EOM are normal.  Neck: Normal range of motion. Neck supple.  Cardiovascular: Normal rate, regular rhythm and normal heart sounds.   Respiratory: Effort normal and breath sounds normal. No respiratory distress.  GI: Soft. There is no tenderness. There is no rebound and no guarding.  Genitourinary:       Normal vagina, small amount watery yellow/gray discharge and some thick white mucous. Cervix visually FT. On digital exam, FT, thick, high, anterior. No CMT.  Musculoskeletal: She  exhibits no edema and no tenderness.       No pitting.  Neurological: She is alert and oriented to person, place, and time.  Skin: Skin is warm and dry.  Psychiatric: She has a normal mood and affect.    Results for orders placed during the hospital encounter of 02/05/12 (from the past 24 hour(s))  URINALYSIS, ROUTINE W REFLEX MICROSCOPIC     Status: Abnormal   Collection Time   02/05/12  7:48 PM      Component Value Range   Color, Urine YELLOW  YELLOW   APPearance CLEAR  CLEAR   Specific Gravity, Urine >1.030 (*) 1.005 - 1.030   pH 6.0  5.0 - 8.0   Glucose, UA 100 (*) NEGATIVE mg/dL   Hgb urine dipstick NEGATIVE  NEGATIVE   Bilirubin Urine NEGATIVE  NEGATIVE   Ketones, ur 15 (*) NEGATIVE mg/dL    Protein, ur NEGATIVE  NEGATIVE mg/dL   Urobilinogen, UA 1.0  0.0 - 1.0 mg/dL   Nitrite NEGATIVE  NEGATIVE   Leukocytes, UA NEGATIVE  NEGATIVE  WET PREP, GENITAL     Status: Abnormal   Collection Time   02/05/12  8:10 PM      Component Value Range   Yeast Wet Prep HPF POC FEW (*) NONE SEEN   Trich, Wet Prep NONE SEEN  NONE SEEN   Clue Cells Wet Prep HPF POC FEW (*) NONE SEEN   WBC, Wet Prep HPF POC MODERATE (*) NONE SEEN    MAU Course  Procedures  FHTs:  145, moderate variability, accels present, no decels. - Reactive  Assessment and Plan  21 y.o. [id number] at [redacted]w[redacted]d with - Yeast and BV - diflucan, metronidazole - GC/chlamydia pending - Lower extremity swelling - mild, non-pitting. Normal BP and no proteinuria, no HA. - D/C home - F/U as scheduled - 12/26 for 17-p injection; 1/2 for prenatal visit.  Napoleon Form, MD   Napoleon Form 02/05/2012, 8:05 PM

## 2012-02-05 NOTE — MAU Provider Note (Signed)
Attestation of Attending Supervision of Obstetric Fellow: Evaluation and management procedures were performed by the Obstetric Fellow under my supervision and collaboration.  I have reviewed the Obstetric Fellow's note and chart, and I agree with the management and plan.  Giuseppina Quinones, MD, FACOG Attending Obstetrician & Gynecologist Faculty Practice, [facility]   

## 2012-02-05 NOTE — MAU Note (Signed)
Pt reports "my feet have been swelling and they hurt", pain in upper abd earlier today and now having "heavy feeling" in lower abd.

## 2012-02-07 ENCOUNTER — Ambulatory Visit (INDEPENDENT_AMBULATORY_CARE_PROVIDER_SITE_OTHER): Payer: Medicaid Other | Admitting: *Deleted

## 2012-02-07 ENCOUNTER — Encounter: Payer: Self-pay | Admitting: *Deleted

## 2012-02-07 DIAGNOSIS — O09219 Supervision of pregnancy with history of pre-term labor, unspecified trimester: Secondary | ICD-10-CM

## 2012-02-07 MED ORDER — HYDROXYPROGESTERONE CAPROATE 250 MG/ML IM OIL
250.0000 mg | TOPICAL_OIL | Freq: Once | INTRAMUSCULAR | Status: AC
  Administered 2012-02-07: 250 mg via INTRAMUSCULAR

## 2012-02-08 LAB — GC/CHLAMYDIA PROBE AMP
CT Probe RNA: NEGATIVE
GC Probe RNA: NEGATIVE

## 2012-02-13 ENCOUNTER — Encounter (HOSPITAL_COMMUNITY): Payer: Self-pay | Admitting: *Deleted

## 2012-02-13 ENCOUNTER — Inpatient Hospital Stay (HOSPITAL_COMMUNITY)
Admission: AD | Admit: 2012-02-13 | Discharge: 2012-02-13 | Disposition: A | Discharge status: Home / Self Care | Payer: Medicaid Other | Source: Ambulatory Visit | Attending: Obstetrics & Gynecology | Admitting: Obstetrics & Gynecology

## 2012-02-13 DIAGNOSIS — N949 Unspecified condition associated with female genital organs and menstrual cycle: Secondary | ICD-10-CM | POA: Insufficient documentation

## 2012-02-13 DIAGNOSIS — O99891 Other specified diseases and conditions complicating pregnancy: Secondary | ICD-10-CM | POA: Insufficient documentation

## 2012-02-13 LAB — POCT FERN TEST: POCT Fern Test: NEGATIVE

## 2012-02-13 NOTE — L&D Delivery Note (Signed)
Delivery Note At 5:51 AM a viable female was delivered via Vaginal, Spontaneous Delivery (Presentation:Right Occiput Anterior ).  APGAR: 8, 10; weight 6 lb 8.2 oz (2954 g).   Placenta status: Delivered Intact, Spontaneous.  Cord: 3 vessels with the following complications: .  Cord pH: N/A  Anesthesia: None  Episiotomy: None Lacerations: None Suture Repair: None  Est. Blood Loss (mL): 300  Mom to postpartum.  Baby to nursery-stable.   [name], DO of [facility] 02/27/2012, 8:05 AM

## 2012-02-13 NOTE — MAU Provider Note (Signed)
Chief Complaint:  Labor Eval   First Provider Initiated Contact with Patient 02/13/12 1130     HPI: Lindsey Velazquez is a 22 y.o. [id number] at [redacted]w[redacted]d who presents to maternity admissions reporting leaking small amount of clear fluid down her leg this morning when she got out of bed. None since.   Denies contractions, or vaginal bleeding. Good fetal movement.   Past Medical History: Past Medical History  Diagnosis Date  . Eczema   . Anxiety   . Depression     Past obstetric history:     OB History    Grav Para Term Preterm Abortions TAB SAB Ect Mult Living   4 2 1 1 1  0 1 0 0 2     # Outc Date GA Lbr Len/2nd Wgt Sex Del Anes PTL Lv   1 PRE 10/09 [redacted]w[redacted]d  3lb15oz(1.786kg) M SVD None  Yes   Comments: born [facility], had PTL, PTD, was on bedrest   2 TRM 8/11 [redacted]w[redacted]d  5lb8oz(2.495kg) F SVD None  Yes   Comments: born [facility], no complications   3 SAB 12/12 [redacted]w[redacted]d          Comments: System Generated. Please review and update pregnancy details.   4 CUR               Past Surgical History: Past Surgical History  Procedure Date  . No past surgeries     Family History: Family History  Problem Relation Age of Onset  . Diabetes Mother   . Thyroid disease Sister     Social History: History  Substance Use Topics  . Smoking status: Former Smoker    Types: Cigarettes  . Smokeless tobacco: Never Used  . Alcohol Use: No    Allergies:  Allergies  Allergen Reactions  . Penicillins Rash    Meds:  Prescriptions prior to admission  Medication Sig Dispense Refill  . acetaminophen (TYLENOL) 500 MG tablet Take 500 mg by mouth every 6 (six) hours as needed. For headache/pain      . fluconazole (DIFLUCAN) 150 MG tablet Take 1 tablet (150 mg total) by mouth once.  1 tablet  0  . Prenatal Vit-Fe Fumarate-FA (PRENATAL MULTIVITAMIN) TABS Take 1 tablet by mouth daily.        ROS: Pertinent findings in history of present illness.  Physical Exam  Blood pressure 113/70, pulse 94, temperature 98.1  F (36.7 C), temperature source Oral, resp. rate 18, last menstrual period 06/08/2011. GENERAL: Well-developed, well-nourished female in no acute distress.  HEENT: normocephalic HEART: normal rate RESP: normal effort ABDOMEN: Soft, non-tender, gravid appropriate for gestational age EXTREMITIES: Nontender, no edema NEURO: alert and oriented SPECULUM EXAM: NEFG, scant amount of clear mucus, neg pooling, no blood, cervix clean  FHT:  Baseline 130 , moderate variability, accelerations present, no decelerations Contractions: irreg, mild   Labs: Results for orders placed during the hospital encounter of 02/13/12 (from the past 24 hour(s))  POCT FERN TEST     Status: Normal   Collection Time   02/13/12 11:08 AM      Component Value Range   POCT Fern Test Negative = intact amniotic membranes    AMNISURE RUPTURE OF MEMBRANE (ROM)     Status: Normal   Collection Time   02/13/12 11:15 AM      Component Value Range   Amnisure ROM NEGATIVE      Imaging:  No results found.  MAU Course:   Assessment: Vaginal discharge in pregnancy. Intact membranes.  Plan: Discharge home Labor precautions and fetal kick counts    Medication List     As of 02/13/2012 11:52 AM    ASK your doctor about these medications         acetaminophen 500 MG tablet   Commonly known as: TYLENOL   Take 500 mg by mouth every 6 (six) hours as needed. For headache/pain      fluconazole 150 MG tablet   Commonly known as: DIFLUCAN   Take 1 tablet (150 mg total) by mouth once.      prenatal multivitamin Tabs   Take 1 tablet by mouth daily.         [place], CNM 02/13/2012 11:52 AM

## 2012-02-13 NOTE — MAU Note (Signed)
Woke up @ 0720, had gush of fluid in pants after using bathroom, does not feel wet now.  Pt denies regular uc's, no bleeding, FM somewhat decreased.

## 2012-02-14 ENCOUNTER — Ambulatory Visit (INDEPENDENT_AMBULATORY_CARE_PROVIDER_SITE_OTHER): Payer: Medicaid Other | Admitting: Obstetrics & Gynecology

## 2012-02-14 VITALS — BP 116/76 | Temp 97.9°F | Wt 160.7 lb

## 2012-02-14 DIAGNOSIS — O0990 Supervision of high risk pregnancy, unspecified, unspecified trimester: Secondary | ICD-10-CM

## 2012-02-14 DIAGNOSIS — O9989 Other specified diseases and conditions complicating pregnancy, childbirth and the puerperium: Secondary | ICD-10-CM

## 2012-02-14 DIAGNOSIS — O09219 Supervision of pregnancy with history of pre-term labor, unspecified trimester: Secondary | ICD-10-CM

## 2012-02-14 DIAGNOSIS — Z8751 Personal history of pre-term labor: Secondary | ICD-10-CM

## 2012-02-14 DIAGNOSIS — Z283 Underimmunization status: Secondary | ICD-10-CM

## 2012-02-14 LAB — POCT URINALYSIS DIP (DEVICE)
Bilirubin Urine: NEGATIVE
Nitrite: NEGATIVE
pH: 6 (ref 5.0–8.0)

## 2012-02-14 LAB — OB RESULTS CONSOLE GBS: GBS: NEGATIVE

## 2012-02-14 NOTE — Progress Notes (Signed)
Persistent proteinuria and glucosuria; normal BP, 1 hr GTT was 83. Will continue to monitor.  GBS culture done today.  Declined flu shot.  No other complaints or concerns.  Fetal movement and labor precautions reviewed.

## 2012-02-14 NOTE — Progress Notes (Signed)
P-92, C/o edema in legs only, Declines flu shot.

## 2012-02-14 NOTE — Patient Instructions (Signed)
Return to clinic for any obstetric concerns or go to MAU for evaluation  

## 2012-02-18 ENCOUNTER — Encounter: Payer: Self-pay | Admitting: Obstetrics & Gynecology

## 2012-02-18 ENCOUNTER — Encounter: Payer: Self-pay | Admitting: *Deleted

## 2012-02-21 ENCOUNTER — Encounter: Payer: Self-pay | Admitting: Obstetrics & Gynecology

## 2012-02-21 ENCOUNTER — Ambulatory Visit (INDEPENDENT_AMBULATORY_CARE_PROVIDER_SITE_OTHER): Payer: Medicaid Other | Admitting: Obstetrics & Gynecology

## 2012-02-21 VITALS — BP 123/73 | Temp 97.4°F | Wt 162.5 lb

## 2012-02-21 DIAGNOSIS — O09219 Supervision of pregnancy with history of pre-term labor, unspecified trimester: Secondary | ICD-10-CM

## 2012-02-21 DIAGNOSIS — Z8751 Personal history of pre-term labor: Secondary | ICD-10-CM

## 2012-02-21 NOTE — Patient Instructions (Signed)
Normal Labor and Delivery  Your caregiver must first be sure you are in labor. Signs of labor include:  · You may pass what is called "the mucus plug" before labor begins. This is a small amount of blood stained mucus.  · Regular uterine contractions.  · The time between contractions get closer together.  · The discomfort and pain gradually gets more intense.  · Pains are mostly located in the back.  · Pains get worse when walking.  · The cervix (the opening of the uterus becomes thinner (begins to efface) and opens up (dilates).  Once you are in labor and admitted into the hospital or care center, your caregiver will do the following:  · A complete physical examination.  · Check your vital signs (blood pressure, pulse, temperature and the fetal heart rate).  · Do a vaginal examination (using a sterile glove and lubricant) to determine:  · The position (presentation) of the baby (head [vertex] or buttock first).  · The level (station) of the baby's head in the birth canal.  · The effacement and dilatation of the cervix.  · You may have your pubic hair shaved and be given an enema depending on your caregiver and the circumstance.  · An electronic monitor is usually placed on your abdomen. The monitor follows the length and intensity of the contractions, as well as the baby's heart rate.  · Usually, your caregiver will insert an IV in your arm with a bottle of sugar water. This is done as a precaution so that medications can be given to you quickly during labor or delivery.  NORMAL LABOR AND DELIVERY IS DIVIDED UP INTO 3 STAGES:  First Stage  This is when regular contractions begin and the cervix begins to efface and dilate. This stage can last from 3 to 15 hours. The end of the first stage is when the cervix is 100% effaced and 10 centimeters dilated. Pain medications may be given by   · Injection (morphine, demerol, etc.)  · Regional anesthesia (spinal, caudal or epidural, anesthetics given in different locations of  the spine). Paracervical pain medication may be given, which is an injection of and anesthetic on each side of the cervix.  A pregnant woman may request to have "Natural Childbirth" which is not to have any medications or anesthesia during her labor and delivery.  Second Stage  This is when the baby comes down through the birth canal (vagina) and is born. This can take 1 to 4 hours. As the baby's head comes down through the birth canal, you may feel like you are going to have a bowel movement. You will get the urge to bear down and push until the baby is delivered. As the baby's head is being delivered, the caregiver will decide if an episiotomy (a cut in the perineum and vagina area) is needed to prevent tearing of the tissue in this area. The episiotomy is sewn up after the delivery of the baby and placenta. Sometimes a mask with nitrous oxide is given for the mother to breath during the delivery of the baby to help if there is too much pain. The end of Stage 2 is when the baby is fully delivered. Then when the umbilical cord stops pulsating it is clamped and cut.  Third Stage  The third stage begins after the baby is completely delivered and ends after the placenta (afterbirth) is delivered. This usually takes 5 to 30 minutes. After the placenta is delivered, a medication   is given either by intravenous or injection to help contract the uterus and prevent bleeding. The third stage is not painful and pain medication is usually not necessary. If an episiotomy was done, it is repaired at this time.  After the delivery, the mother is watched and monitored closely for 1 to 2 hours to make sure there is no postpartum bleeding (hemorrhage). If there is a lot of bleeding, medication is given to contract the uterus and stop the bleeding.  Document Released: 11/08/2007 Document Revised: 04/23/2011 Document Reviewed: 11/08/2007  [employer]® Patient Information ©2013 [employer].

## 2012-02-21 NOTE — Progress Notes (Signed)
Some episodes [facility]. No leaking or bleeding

## 2012-02-27 ENCOUNTER — Encounter (HOSPITAL_COMMUNITY): Payer: Self-pay

## 2012-02-27 ENCOUNTER — Inpatient Hospital Stay (HOSPITAL_COMMUNITY)
Admission: AD | Admit: 2012-02-27 | Discharge: 2012-02-29 | DRG: 774 | Disposition: A | Discharge status: Home / Self Care | Payer: Medicaid Other | Source: Ambulatory Visit | Attending: Family Medicine | Admitting: Family Medicine

## 2012-02-27 DIAGNOSIS — A630 Anogenital (venereal) warts: Secondary | ICD-10-CM | POA: Diagnosis present

## 2012-02-27 DIAGNOSIS — O98519 Other viral diseases complicating pregnancy, unspecified trimester: Secondary | ICD-10-CM | POA: Diagnosis present

## 2012-02-27 DIAGNOSIS — O0990 Supervision of high risk pregnancy, unspecified, unspecified trimester: Secondary | ICD-10-CM

## 2012-02-27 LAB — CBC
HCT: 34.8 % — ABNORMAL LOW (ref 36.0–46.0)
Hemoglobin: 12.1 g/dL (ref 12.0–15.0)
MCHC: 34.8 g/dL (ref 30.0–36.0)
MCV: 94.6 fL (ref 78.0–100.0)
WBC: 8.1 10*3/uL (ref 4.0–10.5)

## 2012-02-27 MED ORDER — PRENATAL MULTIVITAMIN CH
1.0000 | ORAL_TABLET | Freq: Every day | ORAL | Status: DC
  Administered 2012-02-27 – 2012-02-29 (×3): 1 via ORAL
  Filled 2012-02-27 (×3): qty 1

## 2012-02-27 MED ORDER — ZOLPIDEM TARTRATE 5 MG PO TABS: 5.0000 mg | ORAL_TABLET | Freq: Every evening | ORAL | Status: DC | PRN

## 2012-02-27 MED ORDER — FLEET ENEMA 7-19 GM/118ML RE ENEM: 1.0000 | ENEMA | RECTAL | Status: DC | PRN

## 2012-02-27 MED ORDER — FENTANYL CITRATE 0.05 MG/ML IJ SOLN
100.0000 ug | INTRAMUSCULAR | Status: DC | PRN
  Administered 2012-02-27: 100 ug via INTRAVENOUS
  Filled 2012-02-27: qty 2

## 2012-02-27 MED ORDER — IBUPROFEN 600 MG PO TABS: 600.0000 mg | ORAL_TABLET | Freq: Four times a day (QID) | ORAL | Status: DC | PRN

## 2012-02-27 MED ORDER — LIDOCAINE HCL (PF) 1 % IJ SOLN
INTRAMUSCULAR | Status: AC
  Filled 2012-02-27: qty 30

## 2012-02-27 MED ORDER — TETANUS-DIPHTH-ACELL PERTUSSIS 5-2.5-18.5 LF-MCG/0.5 IM SUSP: 0.5000 mL | Freq: Once | INTRAMUSCULAR | Status: DC

## 2012-02-27 MED ORDER — LACTATED RINGERS IV SOLN: INTRAVENOUS | Status: DC

## 2012-02-27 MED ORDER — WITCH HAZEL-GLYCERIN EX PADS: 1.0000 "application " | MEDICATED_PAD | CUTANEOUS | Status: DC | PRN

## 2012-02-27 MED ORDER — OXYCODONE-ACETAMINOPHEN 5-325 MG PO TABS
1.0000 | ORAL_TABLET | ORAL | Status: DC | PRN
  Administered 2012-02-27 – 2012-02-28 (×3): 2 via ORAL
  Administered 2012-02-28 (×2): 1 via ORAL
  Filled 2012-02-27: qty 1
  Filled 2012-02-27: qty 2
  Filled 2012-02-27: qty 1
  Filled 2012-02-27 (×3): qty 2

## 2012-02-27 MED ORDER — ONDANSETRON HCL 4 MG PO TABS: 4.0000 mg | ORAL_TABLET | ORAL | Status: DC | PRN

## 2012-02-27 MED ORDER — DIBUCAINE 1 % RE OINT: 1.0000 "application " | TOPICAL_OINTMENT | RECTAL | Status: DC | PRN

## 2012-02-27 MED ORDER — CITRIC ACID-SODIUM CITRATE 334-500 MG/5ML PO SOLN: 30.0000 mL | ORAL | Status: DC | PRN

## 2012-02-27 MED ORDER — ONDANSETRON HCL 4 MG/2ML IJ SOLN: 4.0000 mg | INTRAMUSCULAR | Status: DC | PRN

## 2012-02-27 MED ORDER — SENNOSIDES-DOCUSATE SODIUM 8.6-50 MG PO TABS
2.0000 | ORAL_TABLET | Freq: Every day | ORAL | Status: DC
  Administered 2012-02-27 – 2012-02-28 (×2): 2 via ORAL

## 2012-02-27 MED ORDER — LIDOCAINE HCL (PF) 1 % IJ SOLN: 30.0000 mL | INTRAMUSCULAR | Status: DC | PRN

## 2012-02-27 MED ORDER — OXYTOCIN BOLUS FROM INFUSION: 500.0000 mL | INTRAVENOUS | Status: DC

## 2012-02-27 MED ORDER — LANOLIN HYDROUS EX OINT: TOPICAL_OINTMENT | CUTANEOUS | Status: DC | PRN

## 2012-02-27 MED ORDER — ONDANSETRON HCL 4 MG/2ML IJ SOLN: 4.0000 mg | Freq: Four times a day (QID) | INTRAMUSCULAR | Status: DC | PRN

## 2012-02-27 MED ORDER — ACETAMINOPHEN 325 MG PO TABS: 650.0000 mg | ORAL_TABLET | ORAL | Status: DC | PRN

## 2012-02-27 MED ORDER — OXYTOCIN 40 UNITS IN LACTATED RINGERS INFUSION - SIMPLE MED
INTRAVENOUS | Status: AC
  Filled 2012-02-27: qty 1000

## 2012-02-27 MED ORDER — OXYTOCIN 40 UNITS IN LACTATED RINGERS INFUSION - SIMPLE MED: 62.5000 mL/h | INTRAVENOUS | Status: DC

## 2012-02-27 MED ORDER — BENZOCAINE-MENTHOL 20-0.5 % EX AERO: 1.0000 "application " | INHALATION_SPRAY | CUTANEOUS | Status: DC | PRN

## 2012-02-27 MED ORDER — SIMETHICONE 80 MG PO CHEW: 80.0000 mg | CHEWABLE_TABLET | ORAL | Status: DC | PRN

## 2012-02-27 MED ORDER — OXYCODONE-ACETAMINOPHEN 5-325 MG PO TABS: 1.0000 | ORAL_TABLET | ORAL | Status: DC | PRN

## 2012-02-27 MED ORDER — DIPHENHYDRAMINE HCL 25 MG PO CAPS
25.0000 mg | ORAL_CAPSULE | Freq: Four times a day (QID) | ORAL | Status: DC | PRN
  Administered 2012-02-27: 25 mg via ORAL
  Filled 2012-02-27: qty 1

## 2012-02-27 MED ORDER — LACTATED RINGERS IV SOLN: 500.0000 mL | INTRAVENOUS | Status: DC | PRN

## 2012-02-27 MED ORDER — IBUPROFEN 600 MG PO TABS
600.0000 mg | ORAL_TABLET | Freq: Four times a day (QID) | ORAL | Status: DC
  Administered 2012-02-27 – 2012-02-29 (×8): 600 mg via ORAL
  Filled 2012-02-27 (×9): qty 1

## 2012-02-27 NOTE — H&P (Signed)
HPI: Lindsey Velazquez is a 22 y.o. year old G13P1112 female at [redacted]w[redacted]d weeks gestation by [place] 1st trimester who presents for  Labor.  + for contractions, + leakage of fluid, no vaginal bleeding, normal fetal movement  No HA, blurred vision, edema, or RUQ pain  Maternal Medical History:  Reason for admission: Reason for admission: rupture of membranes and contractions.  Contractions: Onset was 3-5 hours ago.   Frequency: regular.    Fetal activity: Perceived fetal activity is normal.    Prenatal complications: No bleeding or preterm labor.   Prenatal Complications - Diabetes: none.    OB History    Grav Para Term Preterm Abortions TAB SAB Ect Mult Living   4 2 1 1 1  0 1 0 0 2     Past Medical History  Diagnosis Date  . Eczema   . Anxiety   . Depression    Past Surgical History  Procedure Date  . No past surgeries    Family History: family history includes Diabetes in her mother and Thyroid disease in her sister. Social History:  reports that she has quit smoking. Her smoking use included Cigarettes. She has never used smokeless tobacco. She reports that she does not drink alcohol or use illicit drugs.   Prenatal Transfer Tool  Maternal Diabetes: No Genetic Screening: Normal Maternal Ultrasounds/Referrals: Normal Fetal Ultrasounds or other Referrals:  None Maternal Substance Abuse:  No Significant Maternal Medications:  Meds include: Other:  Significant Maternal Lab Results:  None Other Comments:  HRC for previous preterm births.  On 17p for this pregnancy  ROS  Dilation: 4 Effacement (%): 100 Exam by:: M. Topp, RN Blood pressure 107/77, pulse 85, temperature 97 F (36.1 C), temperature source Oral, resp. rate 20, last menstrual period 06/08/2011. Maternal Exam:  Uterine Assessment: Contraction strength is firm.  Contraction frequency is regular.   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 Cervix: Cervix evaluated by  digital exam.     Physical Exam  Constitutional: She is oriented to person, place, and time. She appears well-developed and well-nourished.  Eyes: EOM are normal.  Neck: Normal range of motion.  Cardiovascular: Regular rhythm.   Respiratory: Effort normal.  Neurological: She is alert and oriented to person, place, and time.    Prenatal labs: ABO, Rh: B/POS/-- (08/01 0272) Antibody: NEG (08/01 0937) Rubella: 4.1 (08/01 0937) RPR: NON REAC (08/01 0937)  HBsAg: NEGATIVE (08/01 0937)  HIV: NON REACTIVE (08/01 0937)  GBS:   Negative   Assessment/Plan: 1) Active Labor - Pt progressing rapidly.  Continue to monitor.   Twana First Hess, DO of [facility] 02/27/2012, 8:02 AM  FHR + accels, occ mi variables but no decels.  I saw the pt and agree with the above.  Arabella Merles CNM 02/27/12 0942am

## 2012-02-27 NOTE — MAU Note (Signed)
Pt states that contractions started around 11am and are about every 2 minutes apart. Denies any leaking of fluid.

## 2012-02-28 ENCOUNTER — Encounter: Payer: Medicaid Other | Admitting: Obstetrics & Gynecology

## 2012-02-28 DIAGNOSIS — A630 Anogenital (venereal) warts: Secondary | ICD-10-CM

## 2012-02-28 DIAGNOSIS — O98519 Other viral diseases complicating pregnancy, unspecified trimester: Secondary | ICD-10-CM

## 2012-02-28 LAB — CBC
HCT: 30 % — ABNORMAL LOW (ref 36.0–46.0)
Platelets: 172 10*3/uL (ref 150–400)
RBC: 3.17 MIL/uL — ABNORMAL LOW (ref 3.87–5.11)
RDW: 14.3 % (ref 11.5–15.5)
WBC: 7.6 10*3/uL (ref 4.0–10.5)

## 2012-02-28 MED ORDER — IBUPROFEN 600 MG PO TABS: 600.0000 mg | ORAL_TABLET | Freq: Four times a day (QID) | ORAL | Status: DC

## 2012-02-28 MED ORDER — BENZOCAINE-MENTHOL 20-0.5 % EX AERO: 1.0000 "application " | INHALATION_SPRAY | CUTANEOUS | Status: DC | PRN

## 2012-02-28 NOTE — Discharge Summary (Signed)
Lindsey Velazquez is a 22 y.o. year old G16P1112 female at [redacted]w[redacted]d weeks gestation by [place] 1st trimester who presented for active labor.  Pt had NSVD viable female APGARS 8,10 weighing 6.8 lbs.  Placenta delivered intact, 3 vessel cord, no laceration, 300 mL blood loss.  Mom plans on breast feeding, would like outpatient circumcision and is unsure of birth control (BTL vs depo vs Implanon)   Obstetric Discharge Summary Reason for Admission: onset of labor Prenatal Procedures: none Intrapartum Procedures: spontaneous vaginal delivery Postpartum Procedures: none Complications-Operative and Postpartum: none Hemoglobin  Date Value Range Status  02/28/2012 10.4* 12.0 - 15.0 g/dL Final     HCT  Date Value Range Status  02/28/2012 30.0* 36.0 - 46.0 % Final    Physical Exam:  General: alert, cooperative and appears stated age 75: appropriate Uterine Fundus: firm DVT Evaluation: No evidence of DVT seen on physical exam.  Discharge Diagnoses: Term Pregnancy-delivered  Discharge Information: Date: 02/28/2012 Activity: pelvic rest Diet: routine Medications: Ibuprofen Condition: stable Instructions: refer to practice specific booklet Discharge to: home   Newborn Data: Live born female  Birth Weight: 6 lb 8.2 oz (2954 g) APGAR: 8, 10  Home with mother.   [name], DO of [facility] 02/28/2012, 7:48 AM

## 2012-02-28 NOTE — Discharge Summary (Signed)
I saw and examined patient and agree with above. However, pediatrics team wanted to keep baby an additional day, so patient's discharge postponed until tomorrow. Napoleon Form, MD

## 2012-02-28 NOTE — H&P (Signed)
Chart reviewed and agree with management and plan.  

## 2012-02-28 NOTE — Progress Notes (Signed)
UR completed 

## 2012-02-28 NOTE — Clinical Social Work Maternal (Signed)
    Clinical Social Work Department PSYCHOSOCIAL ASSESSMENT - MATERNAL/CHILD 02/28/2012  Patient:  Lindsey Velazquez, Lindsey Velazquez  Account Number:  [id number]  Admit Date:  02/27/2012  Marjo Bicker Name:   Ames Coupe    Clinical Social Worker:  Nobie Putnam, LCSW   Date/Time:  02/28/2012 01:37 PM  Date Referred:  02/28/2012   Referral source  CN     Referred reason  Substance Abuse  Depression/Anxiety   Other referral source:    I:  FAMILY / HOME ENVIRONMENT Child's legal guardian:  PARENT  Guardian - Name Guardian - Age Guardian - Address  Neysa Arts 21 [address] [place], [place] [address]  [place] 20    Other household support members/support persons Name Relationship DOB  Deloris Kimbell MOTHER   Issac Salter Ambert Virrueta SON 2009  Amilyah DAUGHTER 2011   Other support:    II  PSYCHOSOCIAL DATA Information Source:  Patient Interview  Event organiser Employment:   Financial resources:  [employer] If Medicaid - County:  [place] Other  Sales executive  WIC  Work [employer] / Grade:   Maternity Care Coordinator / Child Services Coordination / Early Interventions:  Cultural issues impacting care:    III  STRENGTHS Strengths  Adequate Resources  Home prepared for Child (including basic supplies)  Supportive family/friends   Strength comment:    IV  RISK FACTORS AND CURRENT PROBLEMS Current Problem:  YES   Risk Factor & Current Problem Patient Issue Family Issue Risk Factor / Current Problem Comment  Substance Abuse Y N Hx of MJ use  Mental Illness N N Hx depression/anxiety    V  SOCIAL WORK ASSESSMENT CSW referral received to assess pt's history of depression/anxiety. Pt's depression symptoms were treated with Zoloft, about 1 year ago.  Since then, pt has not taken medication and denies any depressed moods.  No SI/HI or PP depression history.  Pt identified her parents, as her primary support system.  She is unsure if FOB plans  to be involved.  History of MJ use noted in pt's chart however pt denies any use during pregnancy.  Prior to pregnancy, she admits to smoking "once in a blue moon."  CSW informed pt of hospital drug testing policy & pt verbalized understanding.  CSW observed pt bonding well with infant, as she was feeding.  Pt appears appropriate at this time. CSW encouraged pt to seek medical attention if PP depression symptoms arise.  CSW available to assist further if needed.      VI SOCIAL WORK PLAN Social Work Plan  No Further Intervention Required / No Barriers to Discharge   Type of pt/family education:   If child protective services report - county:   If child protective services report - date:   Information/referral to community resources comment:   Other social work plan:

## 2012-02-29 ENCOUNTER — Ambulatory Visit (INDEPENDENT_AMBULATORY_CARE_PROVIDER_SITE_OTHER): Payer: Medicaid Other | Admitting: *Deleted

## 2012-02-29 VITALS — BP 129/66 | HR 96 | Temp 96.6°F | Ht 61.0 in | Wt 152.9 lb

## 2012-02-29 DIAGNOSIS — Z3049 Encounter for surveillance of other contraceptives: Secondary | ICD-10-CM

## 2012-02-29 DIAGNOSIS — IMO0001 Reserved for inherently not codable concepts without codable children: Secondary | ICD-10-CM

## 2012-02-29 MED ORDER — MEDROXYPROGESTERONE ACETATE 150 MG/ML IM SUSP
150.0000 mg | Freq: Once | INTRAMUSCULAR | Status: AC
  Administered 2012-02-29: 150 mg via INTRAMUSCULAR

## 2012-02-29 MED ORDER — MEASLES, MUMPS & RUBELLA VAC ~~LOC~~ INJ
0.5000 mL | INJECTION | Freq: Once | SUBCUTANEOUS | Status: AC
  Administered 2012-02-29: 0.5 mL via SUBCUTANEOUS
  Filled 2012-02-29: qty 0.5

## 2012-02-29 NOTE — Progress Notes (Signed)
Post Partum Day 2  Subjective: no complaints, up ad lib, voiding, tolerating PO and + flatus  Objective: Blood pressure 112/73, pulse 94, temperature 98.4 F (36.9 C), temperature source Oral, resp. rate 18, height 5\' 1"  (1.549 m), weight 67.314 kg (148 lb 6.4 oz), last menstrual period 06/08/2011, SpO2 100.00%.  Hospital Course: HPI: Lindsey Velazquez is a 22 y.o. year old G62P1112 female at [redacted]w[redacted]d weeks gestation by [place] 1st trimester who presents for Labor. + for contractions, + leakage of fluid, no vaginal bleeding, normal fetal movement  At 5:51 AM a viable female was delivered via Vaginal, Spontaneous Delivery (Presentation:Right Occiput Anterior ). APGAR: 8, 10; weight 6 lb 8.2 oz (2954 g).  Placenta status: Delivered Intact, Spontaneous. Cord: 3 vessels with the following complications: . Cord pH: N/A  Anesthesia: None  Episiotomy: None  Lacerations: None  Suture Repair: None  Est. Blood Loss (mL): 300  Mom to postpartum. Baby to nursery-stable.    Basename 02/28/12 0543 02/27/12 0425  HGB 10.4* 12.1  HCT 30.0* 34.8*    Lindsey Velazquez 02/29/2012, 1:57 PM   Obstetric Discharge Summary Reason for Admission: onset of labor and rupture of membranes Prenatal Procedures: NST Intrapartum Procedures: spontaneous vaginal delivery Postpartum Procedures: none Complications-Operative and Postpartum: none Hemoglobin  Date Value Range Status  02/28/2012 10.4* 12.0 - 15.0 g/dL Final     HCT  Date Value Range Status  02/28/2012 30.0* 36.0 - 46.0 % Final    Physical Exam:  General: alert and no distress Lochia: appropriate Uterine Fundus: firm Incision: healing well DVT Evaluation: No evidence of DVT seen on physical exam.  Discharge Diagnoses: Term Pregnancy-delivered  Discharge Information: Date: 03/02/2012 Activity: unrestricted and pelvic rest Diet: routine Medications: PNV and Ibuprofen Condition: stable Instructions: refer to practice specific booklet Discharge to:  home Follow-up Information    Follow up with [facility]. Schedule an appointment as soon as possible for a visit in 6 weeks.   Contact information:   [address] [place] [address] [phone]         Newborn Data: Live born female  Birth Weight: 6 lb 8.2 oz (2954 g) APGAR: 8, 10  Home with mother.  Lindsey Velazquez [facility] 02/29/2012 at 1:57 PM  Edited/completed at  [facility] 03/02/2012, 9:34 PM

## 2012-02-29 NOTE — Discharge Summary (Signed)
Obstetric Discharge Summary Reason for Admission: onset of labor Prenatal Procedures: none Intrapartum Procedures: spontaneous vaginal delivery Postpartum Procedures: none Complications-Operative and Postpartum: none Hemoglobin  Date Value Range Status  02/28/2012 10.4* 12.0 - 15.0 g/dL Final     HCT  Date Value Range Status  02/28/2012 30.0* 36.0 - 46.0 % Final    Physical Exam:  General: alert, cooperative and no distress Lochia: appropriate Uterine Fundus: firm Incision: no significant drainage DVT Evaluation: No evidence of DVT seen on physical exam.  Discharge Diagnoses: Term Pregnancy-delivered  Discharge Information: Date: 02/29/2012 Activity: pelvic rest Diet: routine Medications: PNV and Ibuprofen Condition: stable Instructions: refer to practice specific booklet Discharge to: home Follow-up Information    Follow up with [facility]. Schedule an appointment as soon as possible for a visit in 6 weeks.   Contact information:   [address] [place] [address] [phone]         Newborn Data: Live born female  Birth Weight: 6 lb 8.2 oz (2954 g) APGAR: 8, 10  Home with mother.  Henn[place]gaard, Bradley 02/29/2012, 7:59 AM  Seen and agree. See my D/C summary also (I wrote one because this one had not been signed yet) [name] CNM

## 2012-03-11 NOTE — Discharge Summary (Signed)
Attestation of Attending Supervision of Advanced Practitioner: Evaluation and management procedures were performed by the PA/NP/CNM/OB Fellow under my supervision/collaboration. Chart reviewed and agree with management and discharge documentation.  Zeric Baranowski V 03/11/2012 4:09 PM

## 2012-03-27 ENCOUNTER — Ambulatory Visit: Payer: Medicaid Other | Admitting: Medical

## 2012-03-29 ENCOUNTER — Other Ambulatory Visit: Payer: Self-pay

## 2012-04-07 ENCOUNTER — Ambulatory Visit: Payer: Medicaid Other | Admitting: Advanced Practice Midwife

## 2012-04-17 ENCOUNTER — Ambulatory Visit (INDEPENDENT_AMBULATORY_CARE_PROVIDER_SITE_OTHER): Payer: Medicaid Other | Admitting: Medical

## 2012-04-17 ENCOUNTER — Encounter: Payer: Self-pay | Admitting: Medical

## 2012-04-17 VITALS — BP 126/68 | HR 70 | Wt 135.6 lb

## 2012-04-17 DIAGNOSIS — Z3042 Encounter for surveillance of injectable contraceptive: Secondary | ICD-10-CM

## 2012-04-17 DIAGNOSIS — Z3049 Encounter for surveillance of other contraceptives: Secondary | ICD-10-CM

## 2012-04-17 NOTE — Progress Notes (Signed)
Patient ID: Lindsey Velazquez, female   DOB: Jul 27, 1990, 22 y.o.   MRN: [id number]  Subjective:     Lindsey Velazquez is a 22 y.o. female who presents for a postpartum visit. She is 6 weeks postpartum following a spontaneous vaginal delivery. I have fully reviewed the prenatal and intrapartum course. The delivery was at 38.6 gestational weeks. Outcome: spontaneous vaginal delivery. Anesthesia: none. Postpartum course has been normal. Baby's course has been normal. Baby is feeding by bottle Rush Barer - sooth. Bleeding postpartum bleeding stopped, regular period started 04/13/12. Bowel function is normal. Bladder function is normal. Patient is sexually active. Contraception method is Depo-Provera injections. Postpartum depression screening: negative. Patient is concerned about a small abrasion on her lip. She states that it occurred after she pulled a scab from her bottom lip.    The following portions of the patient's history were reviewed and updated as appropriate: allergies, current medications, past family history, past medical history, past social history, past surgical history and problem list.  Review of Systems Pertinent items are noted in HPI.   Objective:    BP 126/68  Pulse 70  Wt 135 lb 9.6 oz (61.508 kg)  BMI 25.63 kg/m2  Breastfeeding? No  General:  alert and cooperative   Breasts:  Not performed  Lungs: clear to auscultation bilaterally  Heart:  regular rate and rhythm, S1, S2 normal, no murmur, click, rub or gallop  Abdomen: soft, non-tender; bowel sounds normal; no masses,  no organomegaly   Vulva:  normal  Vagina: normal vagina  Cervix:  clean, nuliparous in appearance. Normal discharge, no bleeding  Corpus: normal size, contour, position, consistency, mobility, non-tender  Adnexa:  normal adnexa and no mass, fullness, tenderness  Rectal Exam: Not performed.        Assessment:     Normal postpartum exam. Pap smear not done at today's visit.   Plan:    1. Contraception:  Depo-Provera injections. Patient received first dose postpartum and has next injection scheduled next month. Patient will confirm appointment prior to leaving today.  2. Recommended patient try OTC neosporin or triple antibiotic ointment to aid in healing of the small lower lip abrasion 3. Follow up in: December, 2014 for annual exam or as needed.  Next pap smear will be due in 2016.

## 2012-04-17 NOTE — Patient Instructions (Addendum)
Postpartum Care After Vaginal Delivery  After you deliver your baby, you will stay in the hospital for 24 to 72 hours, unless there were problems with the labor or delivery, or you have medical problems. While you are in the hospital, you will receive help and instructions on how to care for yourself and your baby.  Your doctor will order pain medicine, in case you need it. You will have a small amount of bleeding from your vagina and should change your sanitary pad frequently. Wash your hands thoroughly with soap and water for at least 20 seconds after changing pads and using the toilet. Let the nurses know if you begin to pass blood clots or your bleeding increases. Do not flush blood clots down the toilet before having the nurse look at them, to make sure there is no placental tissue with them.  If you had an intravenous (IV), it will be removed within 24 hours, if there are no problems. The first time you get out of bed or take a shower, call the nurse to help you because you may get weak, lightheaded, or even faint. If you are breastfeeding, you may feel painful contractions of your uterus for a couple of weeks. This is normal. The contractions help your uterus get back to normal size. If you are not breastfeeding, wear a supportive bra and handle your breasts as little as possible until your milk has dried up. Hormones should not be given to dry up the breasts, because they can cause blood clots. You will be given your normal diet, unless you have diabetes or other medical problems.   The nurses may put an ice pack on your episiotomy (surgically enlarged opening), if you have one, to reduce the pain and swelling. On rare occasions, you may not be able to urinate and the nurse will need to empty your bladder with a catheter. If you had a postpartum tubal ligation ("tying tubes," female sterilization), it should not make your stay in the hospital longer.  You may have your baby in your room with you as much as  you like, unless you or the baby has a problem. Use the bassinet (basket) for the baby when going to and from the nursery. Do not carry the baby. Do not leave the postpartum area. If the mother is Rh negative (lacks a protein on the red blood cells) and the baby is Rh positive, the mother should get a Rho-gam shot to prevent Rh problems with future pregnancies.  You may be given written instructions for you and your baby, and necessary medicines, when you are discharged from the hospital. Be sure you understand and follow the instructions as advised.  HOME CARE INSTRUCTIONS    Follow instructions and take the medicines given to you.   Only take over-the-counter or prescription medicines for pain, discomfort, or fever as directed by your caregiver.   Do not take aspirin, because it can cause bleeding.   Increase your activities a little bit every day to build up your strength and endurance.   Do not drink alcohol, especially if you are breastfeeding or taking pain medicine.   Take your temperature twice a day and record it.   You may have a small amount of bleeding or spotting for 2 to 4 weeks. This is normal.   Do not use tampons or douche. Use sanitary pads.   Try to have someone stay and help you for a few days when you go home.     Try to rest or take a nap when the baby is sleeping.   If you are breastfeeding, wear a good support bra. If you are not breastfeeding, wear a supportive bra and do not stimulate your nipples.   Eat a healthy, nutritious diet and continue to take your prenatal vitamins.   Do not drive, do any heavy activities, or travel until your caregiver tells you it is okay.   Do not have intercourse until your caregiver gives you permission to do so.   Ask your caregiver when you can begin to exercise and what type of exercises to do.   Call your caregiver if you think you are having a problem from your delivery.   Call your pediatrician if you are having a problem with the  baby.   Schedule your postpartum visit and keep it.  SEEK MEDICAL CARE IF:    You have a temperature of 100 F (37.8 C) or higher.   You have increased vaginal bleeding or are passing clots. Save any clots to show your caregiver.   You have bloody urine or pain when you urinate.   You have a bad smelling vaginal discharge.   You have increasing pain or swelling on your episiotomy.   You develop a severe headache.   You feel depressed.   The episiotomy is separating.   You become dizzy or lightheaded.   You develop a rash.   You have a reaction or problems with your medicine.   You have pain, redness, or swelling at the intravenous site.  SEEK IMMEDIATE MEDICAL CARE IF:    You have chest pain.   You develop shortness of breath.   You pass out.   You develop pain, with or without swelling or redness in your leg.   You develop heavy vaginal bleeding, with or without blood clots.   You develop stomach pain.   You develop a bad smelling vaginal discharge.  MAKE SURE YOU:    Understand these instructions.   Will watch your condition.   Will get help right away if you are not doing well or get worse.  Document Released: 11/26/2006 Document Revised: 04/23/2011 Document Reviewed: 12/08/2008  ExitCare Patient Information 2013 [employer].

## 2012-05-16 ENCOUNTER — Ambulatory Visit (INDEPENDENT_AMBULATORY_CARE_PROVIDER_SITE_OTHER): Payer: Medicaid Other

## 2012-05-16 VITALS — BP 116/74 | HR 61 | Temp 97.7°F | Ht 61.0 in | Wt 133.0 lb

## 2012-05-16 DIAGNOSIS — Z3049 Encounter for surveillance of other contraceptives: Secondary | ICD-10-CM

## 2012-05-16 DIAGNOSIS — Z3042 Encounter for surveillance of injectable contraceptive: Secondary | ICD-10-CM

## 2012-05-16 MED ORDER — MEDROXYPROGESTERONE ACETATE 150 MG/ML IM SUSP
150.0000 mg | Freq: Once | INTRAMUSCULAR | Status: AC
  Administered 2012-05-16: 150 mg via INTRAMUSCULAR

## 2012-08-01 ENCOUNTER — Ambulatory Visit: Payer: Medicaid Other

## 2012-08-04 ENCOUNTER — Ambulatory Visit: Payer: Medicaid Other

## 2012-10-22 ENCOUNTER — Encounter (HOSPITAL_COMMUNITY): Payer: Self-pay

## 2012-10-22 ENCOUNTER — Emergency Department (HOSPITAL_COMMUNITY)
Admission: EM | Admit: 2012-10-22 | Discharge: 2012-10-22 | Disposition: A | Discharge status: Home / Self Care | Payer: Medicaid Other | Attending: Emergency Medicine | Admitting: Emergency Medicine

## 2012-10-22 DIAGNOSIS — L03211 Cellulitis of face: Secondary | ICD-10-CM | POA: Insufficient documentation

## 2012-10-22 DIAGNOSIS — L0201 Cutaneous abscess of face: Secondary | ICD-10-CM | POA: Insufficient documentation

## 2012-10-22 DIAGNOSIS — Y939 Activity, unspecified: Secondary | ICD-10-CM | POA: Insufficient documentation

## 2012-10-22 DIAGNOSIS — Z8659 Personal history of other mental and behavioral disorders: Secondary | ICD-10-CM | POA: Insufficient documentation

## 2012-10-22 DIAGNOSIS — Y929 Unspecified place or not applicable: Secondary | ICD-10-CM | POA: Insufficient documentation

## 2012-10-22 DIAGNOSIS — Z87891 Personal history of nicotine dependence: Secondary | ICD-10-CM | POA: Insufficient documentation

## 2012-10-22 DIAGNOSIS — L0291 Cutaneous abscess, unspecified: Secondary | ICD-10-CM

## 2012-10-22 DIAGNOSIS — Z880 Allergy status to penicillin: Secondary | ICD-10-CM | POA: Insufficient documentation

## 2012-10-22 MED ORDER — SULFAMETHOXAZOLE-TRIMETHOPRIM 800-160 MG PO TABS: 2.0000 | ORAL_TABLET | Freq: Two times a day (BID) | ORAL | Status: DC

## 2012-10-22 MED ORDER — CEPHALEXIN 500 MG PO CAPS: 500.0000 mg | ORAL_CAPSULE | Freq: Four times a day (QID) | ORAL | Status: DC

## 2012-10-22 NOTE — ED Provider Notes (Signed)
This chart was scribed for Lindsey Sinning PA-C, a non-physician practitioner working with [name], MD by [name], ED Scribe. This patient was seen in room TR08C/TR08C and the patient's care was started at 1811.    CSN: [id number]     Arrival date & time 10/22/12  1658 History   First MD Initiated Contact with Patient 10/22/12 1731     Chief Complaint  Patient presents with  . Insect Bite   (Consider location/radiation/quality/duration/timing/severity/associated sxs/prior Treatment) The history is provided by the patient.   HPI Comments: Lindsey Velazquez is a 22 y.o. female who presents to the Emergency Department complaining of possible insect bite over right forehead by hairline onset 2 days. Denies actually seeing an insect. Reports associated constant mild pain to site. Reports pain is exacerbated by touch and alleviated by nothing. Denies associated fever, chills, or recent trauma. Reports trying to push on it and trying alcohol wipes with no relief of symptoms. Denies PMHx of recurrent skin infections, and DM.  Past Medical History  Diagnosis Date  . Eczema   . Anxiety   . Depression    Past Surgical History  Procedure Laterality Date  . No past surgeries     Family History  Problem Relation Age of Onset  . Diabetes Mother   . Thyroid disease Sister    History  Substance Use Topics  . Smoking status: Former Smoker    Types: Cigarettes  . Smokeless tobacco: Never Used  . Alcohol Use: No   OB History   Grav Para Term Preterm Abortions TAB SAB Ect Mult Living   4 3 2 1 1  0 1 0 0 3     Review of Systems  Constitutional: Negative.  Negative for fever.  HENT: Negative.   Psychiatric/Behavioral: Negative for confusion.    Allergies  Penicillins  Home Medications   Current Outpatient Rx  Name  Route  Sig  Dispense  Refill  . medroxyPROGESTERone (DEPO-PROVERA) 150 MG/ML injection   Intramuscular   Inject 150 mg into the muscle every 3 (three)  months.          BP 99/33  Pulse 67  Temp(Src) 98.3 F (36.8 C)  Resp 18  SpO2 99%  LMP 10/12/2012 Physical Exam  Nursing note and vitals reviewed. Constitutional: She is oriented to person, place, and time. She appears well-developed and well-nourished. No distress.  HENT:  Head: Normocephalic and atraumatic.  Eyes: EOM are normal.  Neck: Neck supple. No tracheal deviation present.  Cardiovascular: Normal rate.   Pulmonary/Chest: Effort normal. No respiratory distress.  Musculoskeletal: Normal range of motion.  Neurological: She is alert and oriented to person, place, and time.  Skin: Skin is warm and dry.  1.5 cm raised erythematous area on the right side of her forehead at the hairline, which comes to a head.  No drainage.  No surrounding erythema, or induration.  Psychiatric: She has a normal mood and affect. Her behavior is normal.    ED Course  Procedures (including critical care time) Medications - No data to display  Labs Review Labs Reviewed - No data to display Imaging Review No results found.  INCISION AND DRAINAGE Performed by: Anne Shutter, Laelah Siravo Consent: Verbal consent obtained. Risks and benefits: risks, benefits and alternatives were discussed Type: abscess  Body area: right hairline  Anesthesia: local infiltration  Incision was made with a scalpel.  Local anesthetic: lidocaine 2% with epinephrine  Anesthetic total: 1 ml  Complexity: complex Blunt dissection to  break up loculations  Drainage: purulent  Drainage amount: minimal   Patient tolerance: Patient tolerated the procedure well with no immediate complications.     MDM  No diagnosis found. Patient presenting with an erythematous raised area of the right hairline.  Incision and drainage performed with minimal results.  Patient is currently afebrile.  Patient discharged home with antibiotics.  Patient stable for discharge  Return precautions given.  I personally performed the  services described in this documentation, which was scribed in my presence. The recorded information has been reviewed and is accurate.    Pascal Lux [place], PA-C 10/23/12 [phone]

## 2012-10-22 NOTE — ED Notes (Signed)
Discharge instructions reviewed with pt. Pt verbalized understanding.   

## 2012-10-22 NOTE — ED Notes (Signed)
Pt reports she was bit by something 2 days ago, pt presents with an abscess to Right side of head

## 2012-10-22 NOTE — ED Notes (Signed)
PA at bedside.

## 2012-10-24 NOTE — ED Provider Notes (Signed)
Medical screening examination/treatment/procedure(s) were performed by non-physician practitioner and as supervising physician I was immediately available for consultation/collaboration.    Kanen Mottola D Owen Pagnotta, MD 10/24/12 0905 

## 2012-12-18 ENCOUNTER — Other Ambulatory Visit: Payer: Self-pay

## 2013-02-16 ENCOUNTER — Encounter: Payer: Self-pay | Admitting: Nurse Practitioner

## 2013-02-16 ENCOUNTER — Ambulatory Visit (INDEPENDENT_AMBULATORY_CARE_PROVIDER_SITE_OTHER): Payer: Medicaid Other | Admitting: Nurse Practitioner

## 2013-02-16 VITALS — BP 106/71 | HR 67 | Ht 59.0 in | Wt 118.9 lb

## 2013-02-16 DIAGNOSIS — Z309 Encounter for contraceptive management, unspecified: Secondary | ICD-10-CM | POA: Insufficient documentation

## 2013-02-16 DIAGNOSIS — Z3049 Encounter for surveillance of other contraceptives: Secondary | ICD-10-CM

## 2013-02-16 DIAGNOSIS — Z3042 Encounter for surveillance of injectable contraceptive: Secondary | ICD-10-CM

## 2013-02-16 LAB — POCT PREGNANCY, URINE: PREG TEST UR: NEGATIVE

## 2013-02-16 MED ORDER — MEDROXYPROGESTERONE ACETATE 104 MG/0.65ML ~~LOC~~ SUSP
104.0000 mg | Freq: Once | SUBCUTANEOUS | Status: AC
  Administered 2013-02-16: 104 mg via SUBCUTANEOUS

## 2013-02-16 MED ORDER — MEDROXYPROGESTERONE ACETATE 104 MG/0.65ML ~~LOC~~ SUSP: 104.0000 mg | SUBCUTANEOUS | Status: DC

## 2013-02-16 NOTE — Progress Notes (Signed)
Patient wants to restart depo. Last injection in April. Last unprotected intercourse was greater than 2 weeks ago. Per last visit note she was to followup for annual in 01/2013, however she doesn't need a pap until 2016.

## 2013-02-16 NOTE — Addendum Note (Signed)
Addended by: Candelaria StagersHAIZLIP, Patsy Varma E on: 02/16/2013 02:31 PM   Modules accepted: Orders

## 2013-02-16 NOTE — Progress Notes (Signed)
History:  Lindsey BackJessica Saltersis a 23 y.o. [phone] who [facility] restart Depo Provera. Her last injection was April 2014. She was last sexually active 2-3 weeks ago and it was unprotected. She has negative UPT today.  She does need her annual exam.   The following portions of the patient's history were reviewed and updated as appropriate: allergies, current medications, past family history, past medical history, past social history, past surgical history and problem list.  Review of Systems:  Pertinent items are noted in HPI.  Objective:  Physical Exam BP 106/71  Pulse 67  Ht 4\' 11"  (1.499 m)  Wt 118 lb 14.4 oz (53.933 kg)  BMI 24.00 kg/m2  Breastfeeding? No GENERAL: Well-developed, well-nourished female in no acute distress.   Labs and Imaging No results found. Results for orders placed in visit on 02/16/13 (from the past 24 hour(s))  POCT PREGNANCY, URINE     Status: None   Collection Time    02/16/13  1:34 PM      Result Value Range   Preg Test, Ur NEGATIVE  NEGATIVE    Assessment & Plan:  Assessment:  Contraception  Plans:  Depo Provera 104 mg SQ today and every 12-13 weeks for one year Advised to have protected intercourse for at least 2 weeks Needs to schedule Annual Exam   Delbert PhenixLinda M Zyon Rosser, NP 02/16/2013 1:53 PM

## 2013-02-16 NOTE — Patient Instructions (Signed)

## 2013-03-05 ENCOUNTER — Emergency Department (HOSPITAL_COMMUNITY)
Admission: EM | Admit: 2013-03-05 | Discharge: 2013-03-05 | Disposition: A | Discharge status: Home / Self Care | Payer: Medicaid Other | Attending: Emergency Medicine | Admitting: Emergency Medicine

## 2013-03-05 ENCOUNTER — Encounter (HOSPITAL_COMMUNITY): Payer: Self-pay | Admitting: Emergency Medicine

## 2013-03-05 DIAGNOSIS — Z3202 Encounter for pregnancy test, result negative: Secondary | ICD-10-CM | POA: Insufficient documentation

## 2013-03-05 DIAGNOSIS — Z880 Allergy status to penicillin: Secondary | ICD-10-CM | POA: Insufficient documentation

## 2013-03-05 DIAGNOSIS — N390 Urinary tract infection, site not specified: Secondary | ICD-10-CM | POA: Insufficient documentation

## 2013-03-05 DIAGNOSIS — Z872 Personal history of diseases of the skin and subcutaneous tissue: Secondary | ICD-10-CM | POA: Insufficient documentation

## 2013-03-05 DIAGNOSIS — Z8659 Personal history of other mental and behavioral disorders: Secondary | ICD-10-CM | POA: Insufficient documentation

## 2013-03-05 DIAGNOSIS — F17200 Nicotine dependence, unspecified, uncomplicated: Secondary | ICD-10-CM | POA: Insufficient documentation

## 2013-03-05 LAB — URINE MICROSCOPIC-ADD ON

## 2013-03-05 LAB — URINALYSIS, ROUTINE W REFLEX MICROSCOPIC
BILIRUBIN URINE: NEGATIVE
Glucose, UA: NEGATIVE mg/dL
KETONES UR: NEGATIVE mg/dL
NITRITE: POSITIVE — AB
PH: 7 (ref 5.0–8.0)
Protein, ur: 100 mg/dL — AB
Specific Gravity, Urine: 1.026 (ref 1.005–1.030)
Urobilinogen, UA: 1 mg/dL (ref 0.0–1.0)

## 2013-03-05 LAB — POCT PREGNANCY, URINE: Preg Test, Ur: NEGATIVE

## 2013-03-05 MED ORDER — SULFAMETHOXAZOLE-TMP DS 800-160 MG PO TABS
1.0000 | ORAL_TABLET | Freq: Once | ORAL | Status: AC
  Administered 2013-03-05: 1 via ORAL
  Filled 2013-03-05: qty 1

## 2013-03-05 MED ORDER — SULFAMETHOXAZOLE-TRIMETHOPRIM 800-160 MG PO TABS: 1.0000 | ORAL_TABLET | Freq: Two times a day (BID) | ORAL | Status: DC

## 2013-03-05 MED ORDER — PHENAZOPYRIDINE HCL 200 MG PO TABS: 200.0000 mg | ORAL_TABLET | Freq: Three times a day (TID) | ORAL | Status: DC

## 2013-03-05 NOTE — ED Provider Notes (Signed)
Medical screening examination/treatment/procedure(s) were performed by non-physician practitioner and as supervising physician I was immediately available for consultation/collaboration.  Flint MelterElliott L Leeanne Butters, MD 03/05/13 2017

## 2013-03-05 NOTE — ED Provider Notes (Signed)
CSN: [id number]     Arrival date & time 03/05/13  1115 History   First MD Initiated Contact with Patient 03/05/13 1143     Chief Complaint  Patient presents with  . Urinary Frequency   (Consider location/radiation/quality/duration/timing/severity/associated sxs/prior Treatment) Patient is a 23 y.o. female presenting with frequency. The history is provided by the patient. No language interpreter was used.  Urinary Frequency This is a new problem. The current episode started in the past 7 days. Pertinent negatives include no abdominal pain, chills, fever, nausea or vomiting. Associated symptoms comments: She complains of vaginal discomfort, "like a pressure", that started this week. She is currently normally menstruating and using tampons which is different from her usual use of pads only. No fever, low back pain, N or V. She denies vaginal discharge. .    Past Medical History  Diagnosis Date  . Eczema   . Anxiety   . Depression    Past Surgical History  Procedure Laterality Date  . No past surgeries     Family History  Problem Relation Age of Onset  . Diabetes Mother   . Thyroid disease Sister    History  Substance Use Topics  . Smoking status: Current Every Day Smoker    Types: Cigarettes  . Smokeless tobacco: Never Used  . Alcohol Use: Yes   OB History   Grav Para Term Preterm Abortions TAB SAB Ect Mult Living   4 3 2 1 1  0 1 0 0 3     Review of Systems  Constitutional: Negative for fever and chills.  Respiratory: Negative.   Cardiovascular: Negative.   Gastrointestinal: Negative.  Negative for nausea, vomiting and abdominal pain.  Genitourinary: Positive for dysuria, frequency and pelvic pain. Negative for hematuria, flank pain, vaginal discharge and menstrual problem.  Musculoskeletal: Negative.   Neurological: Negative.     Allergies  Penicillins  Home Medications   Current Outpatient Rx  Name  Route  Sig  Dispense  Refill  . medroxyPROGESTERone  (DEPO-PROVERA) 150 MG/ML injection   Intramuscular   Inject 150 mg into the muscle every 3 (three) months.          BP 104/41  Pulse 71  Temp(Src) 98.3 F (36.8 C) (Oral)  Resp 18  Ht 4\' 10"  (1.473 m)  Wt 116 lb 14.4 oz (53.025 kg)  BMI 24.44 kg/m2  SpO2 100% Physical Exam  Constitutional: She is oriented to person, place, and time. She appears well-developed and well-nourished.  Neck: Normal range of motion.  Pulmonary/Chest: Effort normal.  Abdominal: Soft. She exhibits no distension. There is no tenderness.  Genitourinary:  No CVA tenderness.  Neurological: She is alert and oriented to person, place, and time.  Skin: Skin is warm and dry.  Psychiatric: She has a normal mood and affect.    ED Course  Procedures (including critical care time) Labs Review Labs Reviewed  URINALYSIS, ROUTINE W REFLEX MICROSCOPIC  POCT PREGNANCY, URINE   Imaging Review No results found.  EKG Interpretation   None       MDM  No diagnosis found. 1. UTI  Well appearing patient with symptoms of UTI, no vaginal discharge and positive urine. Uncomplicated UTI.   Arnoldo HookerShari A Tais Koestner, PA-C 03/05/13 1311

## 2013-03-05 NOTE — Discharge Instructions (Signed)
Urinary Tract Infection  Urinary tract infections (UTIs) can develop anywhere along your urinary tract. Your urinary tract is your body's drainage system for removing wastes and extra water. Your urinary tract includes two kidneys, two ureters, a bladder, and a urethra. Your kidneys are a pair of bean-shaped organs. Each kidney is about the size of your fist. They are located below your ribs, one on each side of your spine.  CAUSES  Infections are caused by microbes, which are microscopic organisms, including fungi, viruses, and bacteria. These organisms are so small that they can only be seen through a microscope. Bacteria are the microbes that most commonly cause UTIs.  SYMPTOMS   Symptoms of UTIs may vary by age and gender of the patient and by the location of the infection. Symptoms in young women typically include a frequent and intense urge to urinate and a painful, burning feeling in the bladder or urethra during urination. Older women and men are more likely to be tired, shaky, and weak and have muscle aches and abdominal pain. A fever may mean the infection is in your kidneys. Other symptoms of a kidney infection include pain in your back or sides below the ribs, nausea, and vomiting.  DIAGNOSIS  To diagnose a UTI, your caregiver will ask you about your symptoms. Your caregiver also will ask to provide a urine sample. The urine sample will be tested for bacteria and white blood cells. White blood cells are made by your body to help fight infection.  TREATMENT   Typically, UTIs can be treated with medication. Because most UTIs are caused by a bacterial infection, they usually can be treated with the use of antibiotics. The choice of antibiotic and length of treatment depend on your symptoms and the type of bacteria causing your infection.  HOME CARE INSTRUCTIONS   If you were prescribed antibiotics, take them exactly as your caregiver instructs you. Finish the medication even if you feel better after you  have only taken some of the medication.   Drink enough water and fluids to keep your urine clear or pale yellow.   Avoid caffeine, tea, and carbonated beverages. They tend to irritate your bladder.   Empty your bladder often. Avoid holding urine for long periods of time.   Empty your bladder before and after sexual intercourse.   After a bowel movement, women should cleanse from front to back. Use each tissue only once.  SEEK MEDICAL CARE IF:    You have back pain.   You develop a fever.   Your symptoms do not begin to resolve within 3 days.  SEEK IMMEDIATE MEDICAL CARE IF:    You have severe back pain or lower abdominal pain.   You develop chills.   You have nausea or vomiting.   You have continued burning or discomfort with urination.  MAKE SURE YOU:    Understand these instructions.   Will watch your condition.   Will get help right away if you are not doing well or get worse.  Document Released: 11/08/2004 Document Revised: 07/31/2011 Document Reviewed: 03/09/2011  Exit[employer] Patient Information 2014 [employer].

## 2013-03-05 NOTE — ED Notes (Signed)
Pt states she just started using tampons and ever since she has noticed she has had urinary frequency and urgency

## 2013-03-07 LAB — URINE CULTURE

## 2013-03-08 ENCOUNTER — Telehealth (HOSPITAL_COMMUNITY): Payer: Self-pay | Admitting: Emergency Medicine

## 2013-03-08 NOTE — ED Notes (Signed)
Post ED Visit - Positive Culture Follow-up  Culture report reviewed by antimicrobial stewardship pharmacist: []  Wes Dulaney, Pharm.D., BCPS [x]  Celedonio MiyamotoJeremy Frens, [address]Ph[address] BCPS []  Georgina PillionElizabeth Martin, Pharm.D., BCPS []  EwingMinh Pham, [place]arm[place]D., BCPS, AAHIVP []  Estella HuskMichelle Turner, Pharm.D., BCPS, AAHIVP  Positive urine culture Treated with Sulfa-Trimeth, organism sensitive to the same and no further patient follow-up is required at this time.  Zeb ComfortHolland, Emiliana Blaize 03/08/2013, 11:31 AM

## 2013-05-11 ENCOUNTER — Ambulatory Visit: Payer: Self-pay

## 2013-05-12 ENCOUNTER — Ambulatory Visit: Payer: Self-pay

## 2013-05-13 ENCOUNTER — Ambulatory Visit: Payer: Self-pay

## 2013-05-18 ENCOUNTER — Emergency Department (HOSPITAL_COMMUNITY)
Admission: EM | Admit: 2013-05-18 | Discharge: 2013-05-18 | Disposition: A | Discharge status: Home / Self Care | Payer: Medicaid Other | Attending: Emergency Medicine | Admitting: Emergency Medicine

## 2013-05-18 ENCOUNTER — Encounter (HOSPITAL_COMMUNITY): Payer: Self-pay | Admitting: Emergency Medicine

## 2013-05-18 DIAGNOSIS — F17200 Nicotine dependence, unspecified, uncomplicated: Secondary | ICD-10-CM | POA: Insufficient documentation

## 2013-05-18 DIAGNOSIS — Z872 Personal history of diseases of the skin and subcutaneous tissue: Secondary | ICD-10-CM | POA: Insufficient documentation

## 2013-05-18 DIAGNOSIS — M545 Low back pain, unspecified: Secondary | ICD-10-CM | POA: Insufficient documentation

## 2013-05-18 DIAGNOSIS — R112 Nausea with vomiting, unspecified: Secondary | ICD-10-CM | POA: Insufficient documentation

## 2013-05-18 DIAGNOSIS — Z3202 Encounter for pregnancy test, result negative: Secondary | ICD-10-CM | POA: Insufficient documentation

## 2013-05-18 DIAGNOSIS — Z791 Long term (current) use of non-steroidal anti-inflammatories (NSAID): Secondary | ICD-10-CM | POA: Insufficient documentation

## 2013-05-18 DIAGNOSIS — R197 Diarrhea, unspecified: Secondary | ICD-10-CM | POA: Insufficient documentation

## 2013-05-18 DIAGNOSIS — Z8659 Personal history of other mental and behavioral disorders: Secondary | ICD-10-CM | POA: Insufficient documentation

## 2013-05-18 DIAGNOSIS — E876 Hypokalemia: Secondary | ICD-10-CM | POA: Insufficient documentation

## 2013-05-18 DIAGNOSIS — Z880 Allergy status to penicillin: Secondary | ICD-10-CM | POA: Insufficient documentation

## 2013-05-18 DIAGNOSIS — Z79899 Other long term (current) drug therapy: Secondary | ICD-10-CM | POA: Insufficient documentation

## 2013-05-18 LAB — POC URINE PREG, ED: PREG TEST UR: NEGATIVE

## 2013-05-18 LAB — URINALYSIS, ROUTINE W REFLEX MICROSCOPIC
Glucose, UA: NEGATIVE mg/dL
HGB URINE DIPSTICK: NEGATIVE
Ketones, ur: 40 mg/dL — AB
Leukocytes, UA: NEGATIVE
Nitrite: NEGATIVE
PROTEIN: NEGATIVE mg/dL
Specific Gravity, Urine: 1.025 (ref 1.005–1.030)
Urobilinogen, UA: 1 mg/dL (ref 0.0–1.0)
pH: 6 (ref 5.0–8.0)

## 2013-05-18 LAB — I-STAT CHEM 8, ED
BUN: 8 mg/dL (ref 6–23)
CALCIUM ION: 1.14 mmol/L (ref 1.12–1.23)
CREATININE: 0.9 mg/dL (ref 0.50–1.10)
Chloride: 100 mEq/L (ref 96–112)
Glucose, Bld: 88 mg/dL (ref 70–99)
HCT: 39 % (ref 36.0–46.0)
Hemoglobin: 13.3 g/dL (ref 12.0–15.0)
Potassium: 2.6 mEq/L — CL (ref 3.7–5.3)
Sodium: 140 mEq/L (ref 137–147)
TCO2: 22 mmol/L (ref 0–100)

## 2013-05-18 LAB — POTASSIUM: Potassium: 2.9 mEq/L — CL (ref 3.7–5.3)

## 2013-05-18 MED ORDER — ONDANSETRON HCL 4 MG PO TABS: 4.0000 mg | ORAL_TABLET | Freq: Four times a day (QID) | ORAL | Status: DC

## 2013-05-18 MED ORDER — CYCLOBENZAPRINE HCL 10 MG PO TABS: 10.0000 mg | ORAL_TABLET | Freq: Two times a day (BID) | ORAL | Status: DC | PRN

## 2013-05-18 MED ORDER — NAPROXEN 375 MG PO TABS: 375.0000 mg | ORAL_TABLET | Freq: Two times a day (BID) | ORAL | Status: DC

## 2013-05-18 MED ORDER — CYCLOBENZAPRINE HCL 10 MG PO TABS
5.0000 mg | ORAL_TABLET | Freq: Once | ORAL | Status: AC
  Administered 2013-05-18: 5 mg via ORAL
  Filled 2013-05-18: qty 1

## 2013-05-18 MED ORDER — ONDANSETRON 4 MG PO TBDP
4.0000 mg | ORAL_TABLET | Freq: Once | ORAL | Status: AC
  Administered 2013-05-18: 4 mg via ORAL
  Filled 2013-05-18: qty 1

## 2013-05-18 MED ORDER — OXYCODONE-ACETAMINOPHEN 5-325 MG PO TABS
1.0000 | ORAL_TABLET | Freq: Once | ORAL | Status: AC
  Administered 2013-05-18: 1 via ORAL
  Filled 2013-05-18: qty 1

## 2013-05-18 MED ORDER — POTASSIUM CHLORIDE CRYS ER 20 MEQ PO TBCR: 20.0000 meq | EXTENDED_RELEASE_TABLET | Freq: Three times a day (TID) | ORAL | Status: DC

## 2013-05-18 MED ORDER — POTASSIUM CHLORIDE CRYS ER 20 MEQ PO TBCR
40.0000 meq | EXTENDED_RELEASE_TABLET | Freq: Once | ORAL | Status: AC
  Administered 2013-05-18: 40 meq via ORAL
  Filled 2013-05-18: qty 2

## 2013-05-18 NOTE — ED Notes (Signed)
Patria Maneampos, MD informed re: critical potassium 2.9

## 2013-05-18 NOTE — ED Notes (Signed)
Results of chem 8 given to T. Neva SeatGreene, PA-C

## 2013-05-18 NOTE — Discharge Instructions (Signed)
Hypokalemia Hypokalemia means that the amount of potassium in the blood is lower than normal.Potassium is a chemical, called an electrolyte, that helps regulate the amount of fluid in the body. It also stimulates muscle contraction and helps nerves function properly.Most of the body's potassium is inside of cells, and only a very small amount is in the blood. Because the amount in the blood is so small, minor changes can be life-threatening. CAUSES  Antibiotics.  Diarrhea or vomiting.  Using laxatives too much, which can cause diarrhea.  Chronic kidney disease.  Water pills (diuretics).  Eating disorders (bulimia).  Low magnesium level.  Sweating a lot. SIGNS AND SYMPTOMS  Weakness.  Constipation.  Fatigue.  Muscle cramps.  Mental confusion.  Skipped heartbeats or irregular heartbeat (palpitations).  Tingling or numbness. DIAGNOSIS  Your health care provider can diagnose hypokalemia with blood tests. In addition to checking your potassium level, your health care provider may also check other lab tests. TREATMENT Hypokalemia can be treated with potassium supplements taken by mouth or adjustments in your current medicines. If your potassium level is very low, you may need to get potassium through a vein (IV) and be monitored in the hospital. A diet high in potassium is also helpful. Foods high in potassium are:  Nuts, such as peanuts and pistachios.  Seeds, such as sunflower seeds and pumpkin seeds.  Peas, lentils, and lima beans.  Whole grain and bran cereals and breads.  Fresh fruit and vegetables, such as apricots, avocado, bananas, cantaloupe, kiwi, oranges, tomatoes, asparagus, and potatoes.  Orange and tomato juices.  Red meats.  Fruit yogurt. HOME CARE INSTRUCTIONS  Take all medicines as prescribed by your health care provider.  Maintain a healthy diet by including nutritious food, such as fruits, vegetables, nuts, whole grains, and lean meats.  If  you are taking a laxative, be sure to follow the directions on the label. SEEK MEDICAL CARE IF:  Your weakness gets worse.  You feel your heart pounding or racing.  You are vomiting or having diarrhea.  You are diabetic and having trouble keeping your blood glucose in the normal range. SEEK IMMEDIATE MEDICAL CARE IF:  You have chest pain, shortness of breath, or dizziness.  You are vomiting or having diarrhea for more than 2 days.  You faint. MAKE SURE YOU:   Understand these instructions. Lumbosacral Strain Lumbosacral strain is a strain of any of the parts that make up your lumbosacral vertebrae. Your lumbosacral vertebrae are the bones that make up the lower third of your backbone. Your lumbosacral vertebrae are held together by muscles and tough, fibrous tissue (ligaments).  CAUSES  A sudden blow to your back can cause lumbosacral strain. Also, anything that causes an excessive stretch of the muscles in the low back can cause this strain. This is typically seen when people exert themselves strenuously, fall, lift heavy objects, bend, or crouch repeatedly. RISK FACTORS Physically demanding work. Participation in pushing or pulling sports or sports that require sudden twist of the back (tennis, golf, baseball). Weight lifting. Excessive lower back curvature. Forward-tilted pelvis. Weak back or abdominal muscles or both. Tight hamstrings. SIGNS AND SYMPTOMS  Lumbosacral strain may cause pain in the area of your injury or pain that moves (radiates) down your leg.  DIAGNOSIS Your health care provider can often diagnose lumbosacral strain through a physical exam. In some cases, you may need tests such as X-ray exams.  TREATMENT  Treatment for your lower back injury depends on many factors that your  clinician will have to evaluate. However, most treatment will include the use of anti-inflammatory medicines. HOME CARE INSTRUCTIONS  Avoid hard physical activities (tennis,  racquetball, waterskiing) if you are not in proper physical condition for it. This may aggravate or create problems. If you have a back problem, avoid sports requiring sudden body movements. Swimming and walking are generally safer activities. Maintain good posture. Maintain a healthy weight. For acute conditions, you may put ice on the injured area. Put ice in a plastic bag. Place a towel between your skin and the bag. Leave the ice on for 20 minutes, 2 3 times a day. When the low back starts healing, stretching and strengthening exercises may be recommended. SEEK MEDICAL CARE IF: Your back pain is getting worse. You experience severe back pain not relieved with medicines. SEEK IMMEDIATE MEDICAL CARE IF:  You have numbness, tingling, weakness, or problems with the use of your arms or legs. There is a change in bowel or bladder control. You have increasing pain in any area of the body, including your belly (abdomen). You notice shortness of breath, dizziness, or feel faint. You feel sick to your stomach (nauseous), are throwing up (vomiting), or become sweaty. You notice discoloration of your toes or legs, or your feet get very cold. MAKE SURE YOU:  Understand these instructions. Will watch your condition. Will get help right away if you are not doing well or get worse. Document Released: 11/08/2004 Document Revised: 11/19/2012 Document Reviewed: 09/17/2012 [facility] Patient Information 2014 [place], [place].   Will watch your condition.  Will get help right away if you are not doing well or get worse. Document Released: 01/29/2005 Document Revised: 11/19/2012 Document Reviewed: 08/01/2012 [facility] Patient Information 2014 [place], [place].

## 2013-05-18 NOTE — ED Notes (Signed)
In PEDS with her children

## 2013-05-18 NOTE — ED Provider Notes (Signed)
CSN: [id number]     Arrival date & time 05/18/13  1048 History   First MD Initiated Contact with Patient 05/18/13 1151     Chief Complaint  Patient presents with  . Back Pain     (Consider location/radiation/quality/duration/timing/severity/associated sxs/prior Treatment) HPI  Patient to the ER with complaints of nausea, vomiting, diarrhea and low back pain.  Her symptoms started 12 hours ago, same time as her two children who are in pediatric department for the same problem. They went to a cookout last night. She reports having vomiting yesterday that was retching. Her vomiting and diarrhea are better today and has now resolved. She had crackers and a Ginger Ale in the pediatric department without nausea or vomiting. She now wants to be seen only for her back pain. Her back pain started with the vomiting and now hurts across the lumbar spine. She denies history of having back problems, being in a car accident, abdominal pains, fevers, vaginal discharge, dysuria. It is worse with movement. She has not tried anything for the pain.  Past Medical History  Diagnosis Date  . Eczema   . Anxiety   . Depression    Past Surgical History  Procedure Laterality Date  . No past surgeries     Family History  Problem Relation Age of Onset  . Diabetes Mother   . Thyroid disease Sister    History  Substance Use Topics  . Smoking status: Current Every Day Smoker    Types: Cigarettes  . Smokeless tobacco: Never Used  . Alcohol Use: Yes   OB History   Grav Para Term Preterm Abortions TAB SAB Ect Mult Living   4 3 2 1 1  0 1 0 0 3     Review of Systems  Review of Systems  Gen: no weight loss, fevers, chills, night sweats  Eyes: no discharge or drainage, no occular pain or visual changes  Nose: no epistaxis or rhinorrhea  Mouth: no dental pain, no sore throat  Neck: no neck pain  Lungs:No wheezing, coughing or hemoptysis CV: no chest pain, palpitations, dependent edema or orthopnea  Abd:  no abdominal pain, nausea, vomiting, diarrhea (resolved) GU: no dysuria or gross hematuria  MSK:  No muscle weakness. + low back pain Neuro: no headache, no focal neurologic deficits  Skin: no rash or wounds Psyche: no complaints     Allergies  Penicillins  Home Medications   Current Outpatient Rx  Name  Route  Sig  Dispense  Refill  . medroxyPROGESTERone (DEPO-PROVERA) 150 MG/ML injection   Intramuscular   Inject 150 mg into the muscle every 3 (three) months.         . cyclobenzaprine (FLEXERIL) 10 MG tablet   Oral   Take 1 tablet (10 mg total) by mouth 2 (two) times daily as needed for muscle spasms.   20 tablet   0   . naproxen (NAPROSYN) 375 MG tablet   Oral   Take 1 tablet (375 mg total) by mouth 2 (two) times daily.   20 tablet   0   . ondansetron (ZOFRAN) 4 MG tablet   Oral   Take 1 tablet (4 mg total) by mouth every 6 (six) hours.   12 tablet   0   . potassium chloride SA (K-DUR,KLOR-CON) 20 MEQ tablet   Oral   Take 1 tablet (20 mEq total) by mouth 3 (three) times daily.   30 tablet   0    BP 101/53  Pulse 63  Temp(Src) 98.1 F (36.7 C) (Oral)  Resp 18  Ht 5' (1.524 m)  Wt 108 lb 14.5 oz (49.4 kg)  BMI 21.27 kg/m2  SpO2 100%  LMP 04/11/2012  Breastfeeding? No Physical Exam  Nursing note and vitals reviewed. Constitutional: She appears well-developed and well-nourished. No distress.  HENT:  Head: Normocephalic and atraumatic.  Mouth/Throat: Oropharynx is clear and moist and mucous membranes are normal.  Eyes: Pupils are equal, round, and reactive to light.  Neck: Normal range of motion. Neck supple.  Cardiovascular: Normal rate and regular rhythm.   Pulmonary/Chest: Effort normal.  Abdominal: Soft. Bowel sounds are normal. She exhibits no distension and no ascites. There is no tenderness. There is no rebound, no guarding and no CVA tenderness.  Musculoskeletal:  Pt has equal strength to bilateral lower extremities.  Neurosensory function  adequate to both legs No clonus on dorsiflextion Skin color is normal. Skin is warm and moist.  I see no step off deformity, no midline bony tenderness.  Pt is able to ambulate.  No crepitus, laceration, effusion, induration, lesions, swelling.   Pedal pulses are symmetrical and palpable bilaterally  Mild tenderness to palpation of paraspinel muscles   Neurological: She is alert.  Skin: Skin is warm and dry.  Psychiatric: She has a normal mood and affect. Thought content normal.    ED Course  Procedures (including critical care time) Labs Review Labs Reviewed  URINALYSIS, ROUTINE W REFLEX MICROSCOPIC - Abnormal; Notable for the following:    Color, Urine AMBER (*)    APPearance CLOUDY (*)    Bilirubin Urine SMALL (*)    Ketones, ur 40 (*)    All other components within normal limits  POTASSIUM - Abnormal; Notable for the following:    Potassium 2.9 (*)    All other components within normal limits  I-STAT CHEM 8, ED - Abnormal; Notable for the following:    Potassium 2.6 (*)    All other components within normal limits  POC URINE PREG, ED   Imaging Review No results found.   EKG Interpretation None      MDM   Final diagnoses:  Hypokalemia  Nausea vomiting and diarrhea  Low back pain    Pt has hypokalemia of 2.9 and has had diarrhea and vomiting episodes last night, that has resolved. A potassium of 2.9 is a moderate low potassium. She has no generalized weakness, arrythmia, rhabdo, breathing difficulty. She has no underlying known heart disease nor is she a diabetic. She is no longer having diarrhea. Pt can take potassium supplements at home to replace potassium.   Discussed case with Dr. Patria Mane before discharge.   potassium chloride SA (K-DUR,KLOR-CON) 20 MEQ tablet Take 1 tablet (20 mEq total) by mouth 3 (three) times daily. 30 tablet Dorthula Matas, PA-C       I asked her to follow-up with PCP, pain and nausea medication rx.  22 y.o.Levonia Guilliams's  evaluation in the Emergency Department is complete. It has been determined that no acute conditions requiring further emergency intervention are present at this time. The patient/guardian have been advised of the diagnosis and plan. We have discussed signs and symptoms that warrant return to the ED, such as changes or worsening in symptoms.  Vital signs are stable at discharge. Filed Vitals:   05/18/13 1509  BP: 101/53  Pulse: 63  Temp:   Resp: 18    Patient/guardian has voiced understanding and agreed to follow-up with the PCP or specialist.   Dorena Dew  Neva Seat, PA-C 05/18/13 1520

## 2013-05-18 NOTE — ED Provider Notes (Signed)
Medical screening examination/treatment/procedure(s) were performed by non-physician practitioner and as supervising physician I was immediately available for consultation/collaboration.   EKG Interpretation None        Lyanne CoKevin M Olga Bourbeau, MD 05/18/13 [id number]

## 2013-07-18 ENCOUNTER — Emergency Department (HOSPITAL_COMMUNITY): Payer: Medicaid Other

## 2013-07-18 ENCOUNTER — Encounter (HOSPITAL_COMMUNITY): Payer: Self-pay | Admitting: Emergency Medicine

## 2013-07-18 DIAGNOSIS — Z79899 Other long term (current) drug therapy: Secondary | ICD-10-CM | POA: Insufficient documentation

## 2013-07-18 DIAGNOSIS — F17200 Nicotine dependence, unspecified, uncomplicated: Secondary | ICD-10-CM | POA: Insufficient documentation

## 2013-07-18 DIAGNOSIS — Z791 Long term (current) use of non-steroidal anti-inflammatories (NSAID): Secondary | ICD-10-CM | POA: Insufficient documentation

## 2013-07-18 DIAGNOSIS — Z8659 Personal history of other mental and behavioral disorders: Secondary | ICD-10-CM | POA: Insufficient documentation

## 2013-07-18 DIAGNOSIS — Z880 Allergy status to penicillin: Secondary | ICD-10-CM | POA: Insufficient documentation

## 2013-07-18 DIAGNOSIS — Z872 Personal history of diseases of the skin and subcutaneous tissue: Secondary | ICD-10-CM | POA: Insufficient documentation

## 2013-07-18 DIAGNOSIS — L03019 Cellulitis of unspecified finger: Secondary | ICD-10-CM | POA: Insufficient documentation

## 2013-07-18 NOTE — ED Notes (Signed)
Pt. reports progressing pain at left distal middle finger infection with pain / swelling . No drainage .

## 2013-07-19 ENCOUNTER — Emergency Department (HOSPITAL_COMMUNITY)
Admission: EM | Admit: 2013-07-19 | Discharge: 2013-07-19 | Disposition: A | Discharge status: Home / Self Care | Payer: Medicaid Other | Attending: Emergency Medicine | Admitting: Emergency Medicine

## 2013-07-19 DIAGNOSIS — IMO0002 Reserved for concepts with insufficient information to code with codable children: Secondary | ICD-10-CM

## 2013-07-19 MED ORDER — OXYCODONE-ACETAMINOPHEN 5-325 MG PO TABS
1.0000 | ORAL_TABLET | Freq: Once | ORAL | Status: AC
  Administered 2013-07-19: 1 via ORAL
  Filled 2013-07-19: qty 1

## 2013-07-19 NOTE — ED Notes (Signed)
Pt stated that her finger is been swelling up since Wednesday. Pt with swollen left middle finger with a pain scale of 9. Pt stated that she can not do anything with the finger as it "hurts" real bad.

## 2013-07-19 NOTE — Discharge Instructions (Signed)
Paronychia Paronychia is an inflammatory reaction involving the folds of the skin surrounding the fingernail. This is commonly caused by an infection in the skin around a nail. The most common cause of paronychia is frequent wetting of the hands (as seen with bartenders, food servers, nurses or others who wet their hands). This makes the skin around the fingernail susceptible to infection by bacteria (germs) or fungus. Other predisposing factors are:  Aggressive manicuring.  Nail biting.  Thumb sucking. The most common cause is a staphylococcal (a type of germ) infection, or a fungal (Candida) infection. When caused by a germ, it usually comes on suddenly with redness, swelling, pus and is often painful. It may get under the nail and form an abscess (collection of pus), or form an abscess around the nail. If the nail itself is infected with a fungus, the treatment is usually prolonged and may require oral medicine for up to one year. Your caregiver will determine the length of time treatment is required. The paronychia caused by bacteria (germs) may largely be avoided by not pulling on hangnails or picking at cuticles. When the infection occurs at the tips of the finger it is called felon. When the cause of paronychia is from the herpes simplex virus (HSV) it is called herpetic whitlow. TREATMENT  When an abscess is present treatment is often incision and drainage. This means that the abscess must be cut open so the pus can get out. When this is done, the following home care instructions should be followed. HOME CARE INSTRUCTIONS   It is important to keep the affected fingers very dry. Rubber or plastic gloves over cotton gloves should be used whenever the hand must be placed in water.  Keep wound clean, dry and dressed as suggested by your caregiver between warm soaks or warm compresses.  Soak in warm water for fifteen to twenty minutes three to four times per day for bacterial infections. Fungal  infections are very difficult to treat, so often require treatment for long periods of time.  For bacterial (germ) infections take antibiotics (medicine which kill germs) as directed and finish the prescription, even if the problem appears to be solved before the medicine is gone.  Only take over-the-counter or prescription medicines for pain, discomfort, or fever as directed by your caregiver. SEEK IMMEDIATE MEDICAL CARE IF:  You have redness, swelling, or increasing pain in the wound.  You notice pus coming from the wound.  You have a fever.  You notice a bad smell coming from the wound or dressing. Document Released: 07/25/2000 Document Revised: 04/23/2011 Document Reviewed: 03/26/2008 [employer] Patient Information 2014 [employer].  

## 2013-07-19 NOTE — ED Notes (Signed)
Left middle finger wrapped up with a non adherent pad. No bleeding noted.Area appears slightly swollen.

## 2013-07-19 NOTE — ED Provider Notes (Addendum)
CSN: [id number]     Arrival date & time 07/18/13  2251 History   None    Chief Complaint  Patient presents with  . Finger Injury     (Consider location/radiation/quality/duration/timing/severity/associated sxs/prior Treatment) HPI  Patient c/o pain int the nail bed of the distal left middle finger. Progressively worsening for the past 3 days. Described as constant,pressured, throbbing, pain. She has associated swelling and some greed discoloration at the nail fold. She denies any pain with movement of the joints. Pain does not radiate. She has no history of previous similar sxs or comorbidities. Denies fevers, chills, myalgias, arthralgias. Denies DOE, SOB, chest tightness or pressure, radiation to left arm, jaw or back, or diaphoresis. Denies dysuria, flank pain, suprapubic pain, frequency, urgency, or hematuria. Denies headaches, light headedness, weakness, visual disturbances. Denies abdominal pain, nausea, vomiting, diarrhea or constipation.   Past Medical History  Diagnosis Date  . Eczema   . Anxiety   . Depression    Past Surgical History  Procedure Laterality Date  . No past surgeries     Family History  Problem Relation Age of Onset  . Diabetes Mother   . Thyroid disease Sister    History  Substance Use Topics  . Smoking status: Current Every Day Smoker    Types: Cigarettes  . Smokeless tobacco: Never Used  . Alcohol Use: Yes   OB History   Grav Para Term Preterm Abortions TAB SAB Ect Mult Living   4 3 2 1 1  0 1 0 0 3     Review of Systems  Ten systems reviewed and are negative for acute change, except as noted in the HPI.     Allergies  Penicillins  Home Medications   Prior to Admission medications   Medication Sig Start Date End Date Taking? Authorizing Provider  cyclobenzaprine (FLEXERIL) 10 MG tablet Take 1 tablet (10 mg total) by mouth 2 (two) times daily as needed for muscle spasms. 05/18/13   Tiffany Irine Seal, PA-C  medroxyPROGESTERone  (DEPO-PROVERA) 150 MG/ML injection Inject 150 mg into the muscle every 3 (three) months.    Historical Provider, MD  naproxen (NAPROSYN) 375 MG tablet Take 1 tablet (375 mg total) by mouth 2 (two) times daily. 05/18/13   Tiffany Irine Seal, PA-C  ondansetron (ZOFRAN) 4 MG tablet Take 1 tablet (4 mg total) by mouth every 6 (six) hours. 05/18/13   Tiffany Irine Seal, PA-C  potassium chloride SA (K-DUR,KLOR-CON) 20 MEQ tablet Take 1 tablet (20 mEq total) by mouth 3 (three) times daily. 05/18/13   Tiffany Irine Seal, PA-C   BP 112/65  Pulse 73  Temp(Src) 98.4 F (36.9 C) (Oral)  Resp 18  Ht 5' (1.524 m)  Wt 110 lb (49.896 kg)  BMI 21.48 kg/m2  SpO2 100% Physical Exam  Constitutional: She is oriented to person, place, and time. She appears well-developed and well-nourished. No distress.  HENT:  Head: Normocephalic and atraumatic.  Eyes: Conjunctivae are normal. No scleral icterus.  Neck: Normal range of motion.  Cardiovascular: Normal rate, regular rhythm and normal heart sounds.  Exam reveals no gallop and no friction rub.   No murmur heard. Pulmonary/Chest: Effort normal and breath sounds normal. No respiratory distress.  Abdominal: Soft. Bowel sounds are normal. She exhibits no distension and no mass. There is no tenderness. There is no guarding.  Musculoskeletal:       Hands: Neurological: She is alert and oriented to person, place, and time.  Skin: Skin is warm and dry.  She is not diaphoretic.    ED Course  Drain paronychia Date/Time: 07/26/2013 4:59 PM Performed by: Arthor CaptainHARRIS, Berdina Cheever Authorized by: Arthor CaptainHARRIS, Obaloluwa Delatte Consent: Verbal consent obtained. Risks and benefits: risks, benefits and alternatives were discussed Patient identity confirmed: verbally with patient Time out: Immediately prior to procedure a "time out" was called to verify the correct patient, procedure, equipment, support staff and site/side marked as required. Preparation: Patient was prepped and draped in the usual sterile  fashion. Local anesthesia used: yes Anesthesia: digital block Local anesthetic: lidocaine 2% without epinephrine Anesthetic total: 10 ml Patient sedated: no Patient tolerance: Patient tolerated the procedure well with no immediate complications. Comments: Drainiage of paronychia with 11 blade scalpel.Moderate purulent discharge. Wound cleased thouroughly.    (including critical care time) Labs Review Labs Reviewed - No data to display  Imaging Review Dg Finger Middle Left  07/18/2013   CLINICAL DATA:  Pain and swelling.  No known injury.  EXAM: LEFT MIDDLE FINGER 2+V  COMPARISON:  None.  FINDINGS: The joint spaces are maintained. No acute bony findings or destructive bony changes.  IMPRESSION: No acute bony findings.   Electronically Signed   By: Loralie ChampagneMark  Gallerani M.D.   On: 07/18/2013 23:26     EKG Interpretation None      MDM   Final diagnoses:  Paronychia    Patient with paronychia. successfully drained here in the ED. We discussed home care and reasons to return for a wound check. Patient / Family / Caregiver informed of clinical course, understand medical decision-making process, and agree with plan.  I personally reviewed the imaging tests through PACS system.  I reviewed available ER/hospitalization records through the EMR   .    Arthor CaptainAbigail Amalya Salmons, PA-C 07/26/13 1704  Arthor CaptainAbigail Dyquan Minks, PA-C 07/30/13 1011

## 2013-07-27 NOTE — ED Provider Notes (Signed)
Medical screening examination/treatment/procedure(s) were performed by non-physician practitioner and as supervising physician I was immediately available for consultation/collaboration.   EKG Interpretation None        Enid SkeensJoshua M Jamair Cato, MD 07/27/13 [phone]

## 2013-07-31 NOTE — ED Provider Notes (Signed)
Medical screening examination/treatment/procedure(s) were performed by non-physician practitioner and as supervising physician I was immediately available for consultation/collaboration.   EKG Interpretation None        Enid SkeensJoshua M Lonnette Shrode, MD 07/31/13 [phone]

## 2013-11-10 ENCOUNTER — Telehealth: Payer: Self-pay | Admitting: Internal Medicine

## 2013-11-10 NOTE — Telephone Encounter (Signed)
C/D 11/10/13 for appt. 12/09/13 °

## 2013-11-10 NOTE — Telephone Encounter (Signed)
S/W PT IN REF TO NP APPT. ON 12/09/13@1 :45 REFERRING  DR SMOTHERS DX-LYMPHOCYTOSIS

## 2013-11-11 ENCOUNTER — Encounter (HOSPITAL_COMMUNITY): Payer: Self-pay | Admitting: Emergency Medicine

## 2013-11-11 ENCOUNTER — Emergency Department (HOSPITAL_COMMUNITY)
Admission: EM | Admit: 2013-11-11 | Discharge: 2013-11-11 | Disposition: A | Discharge status: Home / Self Care | Payer: Medicaid Other | Attending: Emergency Medicine | Admitting: Emergency Medicine

## 2013-11-11 DIAGNOSIS — Z4801 Encounter for change or removal of surgical wound dressing: Secondary | ICD-10-CM | POA: Diagnosis present

## 2013-11-11 DIAGNOSIS — F17200 Nicotine dependence, unspecified, uncomplicated: Secondary | ICD-10-CM | POA: Insufficient documentation

## 2013-11-11 DIAGNOSIS — L0201 Cutaneous abscess of face: Secondary | ICD-10-CM | POA: Insufficient documentation

## 2013-11-11 DIAGNOSIS — Z792 Long term (current) use of antibiotics: Secondary | ICD-10-CM | POA: Insufficient documentation

## 2013-11-11 DIAGNOSIS — Z880 Allergy status to penicillin: Secondary | ICD-10-CM | POA: Insufficient documentation

## 2013-11-11 DIAGNOSIS — Z8659 Personal history of other mental and behavioral disorders: Secondary | ICD-10-CM | POA: Insufficient documentation

## 2013-11-11 DIAGNOSIS — L03211 Cellulitis of face: Secondary | ICD-10-CM | POA: Diagnosis not present

## 2013-11-11 MED ORDER — CLINDAMYCIN HCL 150 MG PO CAPS
450.0000 mg | ORAL_CAPSULE | Freq: Once | ORAL | Status: AC
  Administered 2013-11-11: 450 mg via ORAL
  Filled 2013-11-11: qty 3

## 2013-11-11 MED ORDER — HYDROCODONE-ACETAMINOPHEN 5-325 MG PO TABS
2.0000 | ORAL_TABLET | Freq: Once | ORAL | Status: AC
  Administered 2013-11-11: 2 via ORAL
  Filled 2013-11-11: qty 2

## 2013-11-11 MED ORDER — CLINDAMYCIN HCL 150 MG PO CAPS: 450.0000 mg | ORAL_CAPSULE | Freq: Four times a day (QID) | ORAL | Status: DC

## 2013-11-11 NOTE — ED Provider Notes (Signed)
CSN: [id number]     Arrival date & time 11/11/13  1406 History   First MD Initiated Contact with Patient 11/11/13 1425     Chief Complaint  Patient presents with  . Wound Check     (Consider location/radiation/quality/duration/timing/severity/associated sxs/prior Treatment) HPI Patient is a 23 year old female with past medical history of eczema, anxiety, depression who presents to the ER with a draining abscess on her right face. Patient states last night her face was itching, she scratched the side of her face, and continued scratching it. Patient states this morning she noticed her face was painful, and she noticed a mild amount of drainage coming from the area she was scratching. Patient denies any fever, nausea, vomiting, recent illness, history of abscess, facial swelling, change in hearing, change in vision, dizziness, weakness. Past Medical History  Diagnosis Date  . Eczema   . Anxiety   . Depression    Past Surgical History  Procedure Laterality Date  . No past surgeries     Family History  Problem Relation Age of Onset  . Diabetes Mother   . Thyroid disease Sister    History  Substance Use Topics  . Smoking status: Current Every Day Smoker    Types: Cigarettes  . Smokeless tobacco: Never Used  . Alcohol Use: Yes   OB History   Grav Para Term Preterm Abortions TAB SAB Ect Mult Living   4 3 2 1 1  0 1 0 0 3     Review of Systems  Constitutional: Negative for fever and chills.  Skin: Positive for rash.      Allergies  Penicillins  Home Medications   Prior to Admission medications   Medication Sig Start Date End Date Taking? Authorizing Provider  clindamycin (CLEOCIN) 150 MG capsule Take 3 capsules (450 mg total) by mouth every 6 (six) hours. 11/11/13   Monte Fantasia, PA-C   BP 105/61  Pulse 62  Temp(Src) 98 F (36.7 C) (Oral)  Resp 16  SpO2 100% Physical Exam  Nursing note and vitals reviewed. Constitutional: She is oriented to person, place, and  time. She appears well-developed and well-nourished. No distress.  HENT:  Head: Normocephalic and atraumatic.    Right Ear: Tympanic membrane normal.  Left Ear: Tympanic membrane normal.  Mouth/Throat: Uvula is midline and oropharynx is clear and moist. No oropharyngeal exudate, posterior oropharyngeal edema, posterior oropharyngeal erythema or tonsillar abscesses.  Superficial lesion approximately 3 cm in length and approximately 1 cm in width with obvious purulence superficially. Wound appears excoriated and with secondary infection. No fluctuance, obvious cellulitis, erythema, edema. Mild unilateral post auricular lymphadenopathy noted.  Eyes: Right eye exhibits no discharge. Left eye exhibits no discharge. No scleral icterus.  Neck: Normal range of motion.  Pulmonary/Chest: Effort normal. No respiratory distress.  Musculoskeletal: Normal range of motion.  Neurological: She is alert and oriented to person, place, and time.  Skin: Skin is warm and dry. She is not diaphoretic.  Psychiatric: She has a normal mood and affect.    ED Course  Procedures (including critical care time) Labs Review Labs Reviewed - No data to display  Imaging Review No results found.   EKG Interpretation None      MDM   Final diagnoses:  Cellulitis of face    Draining, purulence, superficial lesion on right side of patient's face just anterior to her right ear. No obvious induration, fluctuance, swelling. Mild post auricular lymphadenopathy. Patient afebrile, in no acute distress. Due to patient's probable abscess  on her face being I&D with her scratching, it appears she has created a mild superficial skin infection. We will treat with clindamycin PO, and discharged on clindamycin outpatient. Patient given return precautions, and encouraged to return to the ER in 2 days for wound recheck.  BP 105/61  Pulse 62  Temp(Src) 98 F (36.7 C) (Oral)  Resp 16  SpO2 100%  Signed,  Ladona MowJoe Lyvia Mondesir, PA-C 6:13  PM  Patient seen and discussed with Dr. Pricilla LovelessScott Goldston, M.D.      Monte FantasiaJoseph W Eleanora Guinyard, PA-C 11/11/13 [phone]

## 2013-11-11 NOTE — ED Notes (Signed)
Pt has area to right scalp area that is draining. sts throbbing and feels funny.

## 2013-11-11 NOTE — Discharge Instructions (Signed)

## 2013-11-12 NOTE — ED Provider Notes (Signed)
Medical screening examination/treatment/procedure(s) were conducted as a shared visit with non-physician practitioner(s) and myself.  I personally evaluated the patient during the encounter.   EKG Interpretation None       23 year old female with purulent drainage over right scalp. I believe that she scratched something that caused a localized skin infection. She may party had an infection but seemed open it up. There is no abscess or swelling to her face. At this point will treat with antibiotics and wound care.  Audree CamelScott T Aarianna Hoadley, MD 11/12/13 [phone]

## 2013-11-13 ENCOUNTER — Emergency Department (HOSPITAL_COMMUNITY)
Admission: EM | Admit: 2013-11-13 | Discharge: 2013-11-13 | Disposition: A | Discharge status: Home / Self Care | Payer: Medicaid Other | Attending: Emergency Medicine | Admitting: Emergency Medicine

## 2013-11-13 ENCOUNTER — Encounter (HOSPITAL_COMMUNITY): Payer: Self-pay | Admitting: Emergency Medicine

## 2013-11-13 DIAGNOSIS — L03211 Cellulitis of face: Secondary | ICD-10-CM | POA: Diagnosis not present

## 2013-11-13 DIAGNOSIS — Z8659 Personal history of other mental and behavioral disorders: Secondary | ICD-10-CM | POA: Insufficient documentation

## 2013-11-13 DIAGNOSIS — R509 Fever, unspecified: Secondary | ICD-10-CM | POA: Diagnosis not present

## 2013-11-13 DIAGNOSIS — Z720 Tobacco use: Secondary | ICD-10-CM | POA: Diagnosis not present

## 2013-11-13 DIAGNOSIS — Z880 Allergy status to penicillin: Secondary | ICD-10-CM | POA: Insufficient documentation

## 2013-11-13 DIAGNOSIS — R21 Rash and other nonspecific skin eruption: Secondary | ICD-10-CM | POA: Diagnosis present

## 2013-11-13 MED ORDER — SULFAMETHOXAZOLE-TRIMETHOPRIM 800-160 MG PO TABS: 1.0000 | ORAL_TABLET | Freq: Two times a day (BID) | ORAL | Status: AC

## 2013-11-13 MED ORDER — CEPHALEXIN 500 MG PO CAPS: 500.0000 mg | ORAL_CAPSULE | Freq: Four times a day (QID) | ORAL | Status: DC

## 2013-11-13 NOTE — ED Provider Notes (Signed)
Medical screening examination/treatment/procedure(s) were conducted as a shared visit with non-physician practitioner(s) and myself.  I personally evaluated the patient during the encounter.   EKG Interpretation None       Briefly, pt is a 23 y.o. female presenting with R temporal rash and malaise with itching.  She was seen yesterday for suspected ruptured abscess, given clindamycin.  Hours later she had onset of itching and nausea.  I performed an examination on the patient including cardiac, pulmonary, and gi systems which were unremarkable.  The pt has crusting of R temporal wound which is likely dried purulence from ruptured abscess.  No urticaria, wheezing, or other signs of anaphylaxis, and it is difficulty to distinguish via history if this was a true allergy, as it occurred hours after the medication.  Will switch to bactrim and keflex.  Encouraged pt to take all abx, standard return precautions for allergic reaction and celluliits.  DC home in stable condition.     Mirian MoMatthew Nandini Bogdanski, MD 11/13/13 Ebony [id number]

## 2013-11-13 NOTE — Discharge Instructions (Signed)

## 2013-11-13 NOTE — ED Notes (Signed)
Pt states seen at [facility] days ago. Dx with cellulitis of skin.  Given antibiotics and steroid.  Pt is allergic to antibiotics.  ? Reaction to  Meds.  Pt also c/o fever since last night and not sleeping.

## 2013-11-13 NOTE — ED Notes (Addendum)
Pt reports put on antibiotic for cellulitis of skin due to biting her. Upon assessment yellow discharge dried to right temporal head. Pt reports generalized rash to neck, wrist, and stomach since starting antibiotic. Pt reports neck swelling; right lymph node enlargement. Pt denies difficulty swallowing, SOB, or CP.

## 2013-11-13 NOTE — ED Provider Notes (Signed)
CSN: [id number]     Arrival date & time 11/13/13  1730 History   First MD Initiated Contact with Patient 11/13/13 1754     Chief Complaint  Patient presents with  . Rash     (Consider location/radiation/quality/duration/timing/severity/associated sxs/prior Treatment) HPI Lindsey Velazquez is a 23 y.o. female a history of eczema, anxiety is here for reevaluation of cellulitis to right year she was seen for on September 30 in the ED here. She reports the antibiotic she was given in the ED made her itch and gave her a fever so she did not fill the prescription at the pharmacy. She reports her fever is now gone, although she does still complain of pruritis to her right ear and left neck.  Past Medical History  Diagnosis Date  . Eczema   . Anxiety   . Depression    Past Surgical History  Procedure Laterality Date  . No past surgeries     Family History  Problem Relation Age of Onset  . Diabetes Mother   . Thyroid disease Sister    History  Substance Use Topics  . Smoking status: Current Every Day Smoker    Types: Cigarettes  . Smokeless tobacco: Never Used  . Alcohol Use: Yes   OB History   Grav Para Term Preterm Abortions TAB SAB Ect Mult Living   4 3 2 1 1  0 1 0 0 3     Review of Systems  Constitutional: Positive for fever.  Respiratory: Negative for shortness of breath.   Cardiovascular: Negative for chest pain.  Skin: Positive for rash.      Allergies  Clindamycin/lincomycin and Penicillins  Home Medications   Prior to Admission medications   Medication Sig Start Date End Date Taking? Authorizing Provider  cephALEXin (KEFLEX) 500 MG capsule Take 1 capsule (500 mg total) by mouth 4 (four) times daily. 11/13/13   Earle GellBenjamin W Chundra Sauerwein, PA-C  sulfamethoxazole-trimethoprim (BACTRIM DS,SEPTRA DS) 800-160 MG per tablet Take 1 tablet by mouth 2 (two) times daily. 11/13/13 11/20/13  Earle GellBenjamin W Marchia Diguglielmo, PA-C   BP 102/63  Pulse 77  Temp(Src) 98.8 F (37.1 C) (Oral)  Resp 16   SpO2 100% Physical Exam  Nursing note and vitals reviewed. Constitutional:  Awake, alert, nontoxic appearance.  HENT:  Head: Atraumatic.  Eyes: Right eye exhibits no discharge. Left eye exhibits no discharge.  Neck: Neck supple.  Pulmonary/Chest: Effort normal. She exhibits no tenderness.  Abdominal: Soft. There is no tenderness. There is no rebound.  Musculoskeletal: She exhibits no tenderness.  Baseline ROM, no obvious new focal weakness.  Neurological:  Mental status and motor strength appears baseline for patient and situation.  Skin: No rash noted.  Patient's cellulitis to anterior right ear appears to be healing well. No drainage or bleeding from the wound site. Left anterior neck appears slightly dry and scaly with no open lesions or sores. No overt erythema  Psychiatric: She has a normal mood and affect.    ED Course  Procedures (including critical care time) Labs Review Labs Reviewed - No data to display  Imaging Review No results found.   EKG Interpretation None      MDM  Vitals stable - WNL -afebrile Pt resting comfortably in ED. PE and clinical picture not consistent with emergent pathology or worsening from previous encounter.  Will DC with Bactrim and Keflex, recommending moisturizers and lotions she has at home to treat her eczema. She admits she has not been using it lately. Discussed f/u with  PCP and return precautions, pt very amenable to plan.  Prior to patient discharge, I discussed and reviewed this case with Dr.Matt Littie Deeds    Final diagnoses:  Cellulitis of face        Sharlene Motts, PA-C 11/13/13 1915

## 2013-11-27 ENCOUNTER — Other Ambulatory Visit: Payer: Self-pay

## 2013-12-08 ENCOUNTER — Other Ambulatory Visit: Payer: Self-pay | Admitting: Medical Oncology

## 2013-12-08 DIAGNOSIS — D72820 Lymphocytosis (symptomatic): Secondary | ICD-10-CM

## 2013-12-09 ENCOUNTER — Ambulatory Visit: Payer: Medicaid Other

## 2013-12-09 ENCOUNTER — Telehealth: Payer: Self-pay | Admitting: Internal Medicine

## 2013-12-09 ENCOUNTER — Other Ambulatory Visit (HOSPITAL_BASED_OUTPATIENT_CLINIC_OR_DEPARTMENT_OTHER): Payer: Medicaid Other

## 2013-12-09 ENCOUNTER — Ambulatory Visit (HOSPITAL_BASED_OUTPATIENT_CLINIC_OR_DEPARTMENT_OTHER): Payer: Medicaid Other | Admitting: Internal Medicine

## 2013-12-09 ENCOUNTER — Encounter: Payer: Self-pay | Admitting: Internal Medicine

## 2013-12-09 ENCOUNTER — Other Ambulatory Visit: Payer: Self-pay | Admitting: Internal Medicine

## 2013-12-09 VITALS — BP 107/75 | HR 61 | Temp 98.5°F | Resp 18 | Ht 60.0 in | Wt 106.7 lb

## 2013-12-09 DIAGNOSIS — D72819 Decreased white blood cell count, unspecified: Secondary | ICD-10-CM

## 2013-12-09 DIAGNOSIS — D709 Neutropenia, unspecified: Secondary | ICD-10-CM | POA: Insufficient documentation

## 2013-12-09 DIAGNOSIS — D72820 Lymphocytosis (symptomatic): Secondary | ICD-10-CM

## 2013-12-09 LAB — CBC WITH DIFFERENTIAL/PLATELET
BASO%: 0.3 % (ref 0.0–2.0)
BASOS ABS: 0 10*3/uL (ref 0.0–0.1)
EOS%: 2 % (ref 0.0–7.0)
Eosinophils Absolute: 0.1 10*3/uL (ref 0.0–0.5)
HEMATOCRIT: 34 % — AB (ref 34.8–46.6)
HGB: 11.6 g/dL (ref 11.6–15.9)
LYMPH%: 49.6 % (ref 14.0–49.7)
MCH: 31.1 pg (ref 25.1–34.0)
MCHC: 34.1 g/dL (ref 31.5–36.0)
MCV: 91.2 fL (ref 79.5–101.0)
MONO#: 0.5 10*3/uL (ref 0.1–0.9)
MONO%: 13.7 % (ref 0.0–14.0)
NEUT#: 1.2 10*3/uL — ABNORMAL LOW (ref 1.5–6.5)
NEUT%: 34.4 % — AB (ref 38.4–76.8)
Platelets: 196 10*3/uL (ref 145–400)
RBC: 3.73 10*6/uL (ref 3.70–5.45)
RDW: 14.2 % (ref 11.2–14.5)
WBC: 3.4 10*3/uL — ABNORMAL LOW (ref 3.9–10.3)
lymph#: 1.7 10*3/uL (ref 0.9–3.3)

## 2013-12-09 LAB — COMPREHENSIVE METABOLIC PANEL (CC13)
ALK PHOS: 50 U/L (ref 40–150)
ALT: 13 U/L (ref 0–55)
AST: 14 U/L (ref 5–34)
Albumin: 3.4 g/dL — ABNORMAL LOW (ref 3.5–5.0)
Anion Gap: 5 mEq/L (ref 3–11)
BUN: 9.9 mg/dL (ref 7.0–26.0)
CALCIUM: 8.8 mg/dL (ref 8.4–10.4)
CO2: 24 mEq/L (ref 22–29)
CREATININE: 0.8 mg/dL (ref 0.6–1.1)
Chloride: 109 mEq/L (ref 98–109)
Glucose: 88 mg/dl (ref 70–140)
Potassium: 3.3 mEq/L — ABNORMAL LOW (ref 3.5–5.1)
Sodium: 139 mEq/L (ref 136–145)
Total Bilirubin: 0.6 mg/dL (ref 0.20–1.20)
Total Protein: 7.3 g/dL (ref 6.4–8.3)

## 2013-12-09 NOTE — Progress Notes (Signed)
Checked in new pt with no financial concerns.  Pt has Medicaid so she should be covered at 100%.

## 2013-12-09 NOTE — Telephone Encounter (Signed)
Pt confirmed labs/ov per 10/28 POF, gave pt AVS.... KJ °

## 2013-12-09 NOTE — Progress Notes (Signed)
[facility] [phone]   [phone]  CONSULT NOTE  REFERRING PHYSICIAN: Dr. Glenetta BorgSmothers.  REASON FOR CONSULTATION:  23 years old African-American female with neutropenia.  HPI Lindsey Velazquez is a 23 y.o. female was no significant past medical history. The patient was seen recently by Dr. Glenetta BorgSmothers for routine physical examination and she was complaining of fatigue. She had CBC and thyroid function tests performed at that time. Her CBC showed mild leukocytopenia and neutropenia. The patient was referred to me today for evaluation and recommendation regarding her condition. She denied having any recent medical issues but was seen at the [facility] in early October 2015 complaining of purulent drainage from the right scalp area with localized skin infection. She was treated initially with clindamycin. This was complicated with rash and discontinued and the patient was started on treatment with Bactrim and Keflex. She does not take any over-the-counter medication. She is feeling fine today was no specific complaints except for mild fatigue. The patient denied having any fever or chills, no chest pain, shortness breath, cough or hemoptysis. She has no nausea or vomiting. She has a family history of anemia in her mother. Her mother also has high blood pressure and diabetes mellitus. The medical history of her father is unknown. The patient is single and has 3 children age 715, 4 and 1. She used to work at [employer] but currently in a school for Engineer, sitemedical assistant. She has a history of smoking less than one pack per day for the last 6 years and I strongly encouraged her to quit smoking. She drinks alcohol occasionally and no history of drug abuse. HPI  Past Medical History  Diagnosis Date  . Eczema   . Anxiety   . Depression     Past Surgical History  Procedure Laterality Date  . No past surgeries      Family History  Problem  Relation Age of Onset  . Diabetes Mother   . Thyroid disease Sister     Social History History  Substance Use Topics  . Smoking status: Current Every Day Smoker    Types: Cigarettes  . Smokeless tobacco: Never Used  . Alcohol Use: Yes    Allergies  Allergen Reactions  . Clindamycin/Lincomycin Rash  . Penicillins Rash    Current Outpatient Prescriptions  Medication Sig Dispense Refill  . cephALEXin (KEFLEX) 500 MG capsule Take 1 capsule (500 mg total) by mouth 4 (four) times daily.  40 capsule  0   No current facility-administered medications for this visit.    Review of Systems  Constitutional: positive for fatigue Eyes: negative Ears, nose, mouth, throat, and face: negative Respiratory: negative Cardiovascular: negative Gastrointestinal: negative Genitourinary:negative Integument/breast: negative Hematologic/lymphatic: negative Musculoskeletal:negative Neurological: negative Behavioral/Psych: negative Endocrine: negative Allergic/Immunologic: negative  Physical Exam  AVW:UJWJXRAL:alert, healthy, no distress, well nourished and well developed SKIN: skin color, texture, turgor are normal, no rashes or significant lesions HEAD: Normocephalic, No masses, lesions, tenderness or abnormalities EYES: normal, PERRLA EARS: External ears normal, Canals clear OROPHARYNX:no exudate, no erythema and lips, buccal mucosa, and tongue normal  NECK: supple, no adenopathy, no JVD LYMPH:  no palpable lymphadenopathy, no hepatosplenomegaly BREAST:not examined LUNGS: clear to auscultation , and palpation HEART: regular rate & rhythm and no murmurs ABDOMEN:abdomen soft, non-tender, normal bowel sounds and no masses or organomegaly BACK: Back symmetric, no curvature., No CVA tenderness EXTREMITIES:no joint deformities, effusion, or inflammation, no edema, no skin discoloration, no clubbing  NEURO: alert &  oriented x 3 with fluent speech, no focal motor/sensory deficits  PERFORMANCE  STATUS: ECOG 0  LABORATORY DATA: Lab Results  Component Value Date   WBC 3.4* 12/09/2013   HGB 11.6 12/09/2013   HCT 34.0* 12/09/2013   MCV 91.2 12/09/2013   PLT 196 12/09/2013      Chemistry      Component Value Date/Time   NA 139 12/09/2013 1434   NA 140 05/18/2013 1316   K 3.3* 12/09/2013 1434   K 2.9* 05/18/2013 1340   CL 100 05/18/2013 1316   CO2 24 12/09/2013 1434   CO2 18* 01/20/2009 1755   BUN 9.9 12/09/2013 1434   BUN 8 05/18/2013 1316   CREATININE 0.8 12/09/2013 1434   CREATININE 0.90 05/18/2013 1316      Component Value Date/Time   CALCIUM 8.8 12/09/2013 1434   CALCIUM 8.7 01/20/2009 1755   ALKPHOS 50 12/09/2013 1434   ALKPHOS 34* 01/20/2009 1755   AST 14 12/09/2013 1434   AST 37 01/20/2009 1755   ALT 13 12/09/2013 1434   ALT 14 01/20/2009 1755   BILITOT 0.60 12/09/2013 1434   BILITOT 1.0 01/20/2009 1755       RADIOGRAPHIC STUDIES: No results found.  ASSESSMENT: This is a very pleasant 23 years old African-American female visited today for evaluation of mild leukocytopenia and neutropenia most likely drug-induced secondary to recent treatment with Bactrim. The patient has no other abnormality on her CBC  PLAN: I had a lengthy discussion with the patient today about her current condition and likely etiology of her neutropenia. The patient is currently asymptomatic except for mild fatigue. I discussed with the patient continues observation for now with repeat CBC in 3 months for reevaluation of her condition. I don't see a need to proceed with any further investigation at this point but we will continue to monitor the patient closely for any changes. She agreed to the current plan and I strongly recommended for her to call me immediately if she has any other concerning symptoms in the interval. The patient voices understanding of current disease status and treatment options and is in agreement with the current care plan.  All questions were answered. The patient knows  to call the clinic with any problems, questions or concerns. We can certainly see the patient much sooner if necessary.  Thank you so much for allowing me to participate in the care of [employer] Dunlop. I will continue to follow up the patient with you and assist in her care.  I spent 30 minutes counseling the patient face to face. The total time spent in the appointment was 55 minutes.  Disclaimer: This note was dictated with voice recognition software. Similar sounding words can inadvertently be transcribed and may not be corrected upon review.   Sorren Vallier K. 12/09/2013, 3:27 PM

## 2013-12-14 ENCOUNTER — Encounter (HOSPITAL_COMMUNITY): Payer: Self-pay | Admitting: Emergency Medicine

## 2014-02-23 ENCOUNTER — Telehealth: Payer: Self-pay | Admitting: Internal Medicine

## 2014-02-23 ENCOUNTER — Other Ambulatory Visit (HOSPITAL_BASED_OUTPATIENT_CLINIC_OR_DEPARTMENT_OTHER): Payer: Medicaid Other

## 2014-02-23 ENCOUNTER — Ambulatory Visit (HOSPITAL_BASED_OUTPATIENT_CLINIC_OR_DEPARTMENT_OTHER): Payer: Medicaid Other | Admitting: Internal Medicine

## 2014-02-23 ENCOUNTER — Encounter: Payer: Self-pay | Admitting: Internal Medicine

## 2014-02-23 VITALS — BP 105/68 | HR 60 | Temp 98.3°F | Resp 18 | Ht 60.0 in | Wt 109.0 lb

## 2014-02-23 DIAGNOSIS — D72819 Decreased white blood cell count, unspecified: Secondary | ICD-10-CM

## 2014-02-23 DIAGNOSIS — D709 Neutropenia, unspecified: Secondary | ICD-10-CM

## 2014-02-23 LAB — CBC WITH DIFFERENTIAL/PLATELET
BASO%: 0.6 % (ref 0.0–2.0)
Basophils Absolute: 0 10*3/uL (ref 0.0–0.1)
EOS%: 0.9 % (ref 0.0–7.0)
Eosinophils Absolute: 0 10*3/uL (ref 0.0–0.5)
HEMATOCRIT: 36.8 % (ref 34.8–46.6)
HEMOGLOBIN: 12 g/dL (ref 11.6–15.9)
LYMPH%: 50.7 % — ABNORMAL HIGH (ref 14.0–49.7)
MCH: 31.1 pg (ref 25.1–34.0)
MCHC: 32.6 g/dL (ref 31.5–36.0)
MCV: 95.6 fL (ref 79.5–101.0)
MONO#: 0.4 10*3/uL (ref 0.1–0.9)
MONO%: 9.7 % (ref 0.0–14.0)
NEUT#: 1.4 10*3/uL — ABNORMAL LOW (ref 1.5–6.5)
NEUT%: 38.1 % — ABNORMAL LOW (ref 38.4–76.8)
PLATELETS: 201 10*3/uL (ref 145–400)
RBC: 3.85 10*6/uL (ref 3.70–5.45)
RDW: 14.7 % — AB (ref 11.2–14.5)
WBC: 3.7 10*3/uL — AB (ref 3.9–10.3)
lymph#: 1.9 10*3/uL (ref 0.9–3.3)

## 2014-02-23 LAB — COMPREHENSIVE METABOLIC PANEL (CC13)
ALT: 7 U/L (ref 0–55)
ANION GAP: 5 meq/L (ref 3–11)
AST: 14 U/L (ref 5–34)
Albumin: 3.4 g/dL — ABNORMAL LOW (ref 3.5–5.0)
Alkaline Phosphatase: 46 U/L (ref 40–150)
BUN: 12.7 mg/dL (ref 7.0–26.0)
CO2: 23 meq/L (ref 22–29)
CREATININE: 0.8 mg/dL (ref 0.6–1.1)
Calcium: 8.4 mg/dL (ref 8.4–10.4)
Chloride: 107 mEq/L (ref 98–109)
EGFR: >90 mL/min/{1.73_m2} (ref >90)
Glucose: 86 mg/dl (ref 70–140)
Potassium: 3.7 mEq/L (ref 3.5–5.1)
Sodium: 135 mEq/L — ABNORMAL LOW (ref 136–145)
TOTAL PROTEIN: 7.4 g/dL (ref 6.4–8.3)
Total Bilirubin: 0.42 mg/dL (ref 0.20–1.20)

## 2014-02-23 LAB — LACTATE DEHYDROGENASE (CC13): LDH: 149 U/L (ref 125–245)

## 2014-02-23 NOTE — Patient Instructions (Signed)
Smoking Cessation, Tips for Success  If you are ready to quit smoking, congratulations! You have chosen to help yourself be healthier. Cigarettes bring nicotine, tar, carbon monoxide, and other irritants into your body. Your lungs, heart, and blood vessels will be able to work better without these poisons. There are many different ways to quit smoking. Nicotine gum, nicotine patches, a nicotine inhaler, or nicotine nasal spray can help with physical craving. Hypnosis, support groups, and medicines help break the habit of smoking.  WHAT THINGS CAN I DO TO MAKE QUITTING EASIER?   Here are some tips to help you quit for good:  · Pick a date when you will quit smoking completely. Tell all of your friends and family about your plan to quit on that date.  · Do not try to slowly cut down on the number of cigarettes you are smoking. Pick a quit date and quit smoking completely starting on that day.  · Throw away all cigarettes.    · Clean and remove all ashtrays from your home, work, and car.  · On a card, write down your reasons for quitting. Carry the card with you and read it when you get the urge to smoke.  · Cleanse your body of nicotine. Drink enough water and fluids to keep your urine clear or pale yellow. Do this after quitting to flush the nicotine from your body.  · Learn to predict your moods. Do not let a bad situation be your excuse to have a cigarette. Some situations in your life might tempt you into wanting a cigarette.  · Never have "just one" cigarette. It leads to wanting another and another. Remind yourself of your decision to quit.  · Change habits associated with smoking. If you smoked while driving or when feeling stressed, try other activities to replace smoking. Stand up when drinking your coffee. Brush your teeth after eating. Sit in a different chair when you read the paper. Avoid alcohol while trying to quit, and try to drink fewer caffeinated beverages. Alcohol and caffeine may urge you to  smoke.  · Avoid foods and drinks that can trigger a desire to smoke, such as sugary or spicy foods and alcohol.  · Ask people who smoke not to smoke around you.  · Have something planned to do right after eating or having a cup of coffee. For example, plan to take a walk or exercise.  · Try a relaxation exercise to calm you down and decrease your stress. Remember, you may be tense and nervous for the first 2 weeks after you quit, but this will pass.  · Find new activities to keep your hands busy. Play with a pen, coin, or rubber band. Doodle or draw things on paper.  · Brush your teeth right after eating. This will help cut down on the craving for the taste of tobacco after meals. You can also try mouthwash.    · Use oral substitutes in place of cigarettes. Try using lemon drops, carrots, cinnamon sticks, or chewing gum. Keep them handy so they are available when you have the urge to smoke.  · When you have the urge to smoke, try deep breathing.  · Designate your home as a nonsmoking area.  · If you are a heavy smoker, ask your health care provider about a prescription for nicotine chewing gum. It can ease your withdrawal from nicotine.  · Reward yourself. Set aside the cigarette money you save and buy yourself something nice.  · Look for   support from others. Join a support group or smoking cessation program. Ask someone at home or at work to help you with your plan to quit smoking.  · Always ask yourself, "Do I need this cigarette or is this just a reflex?" Tell yourself, "Today, I choose not to smoke," or "I do not want to smoke." You are reminding yourself of your decision to quit.  · Do not replace cigarette smoking with electronic cigarettes (commonly called e-cigarettes). The safety of e-cigarettes is unknown, and some may contain harmful chemicals.  · If you relapse, do not give up! Plan ahead and think about what you will do the next time you get the urge to smoke.  HOW WILL I FEEL WHEN I QUIT SMOKING?  You  may have symptoms of withdrawal because your body is used to nicotine (the addictive substance in cigarettes). You may crave cigarettes, be irritable, feel very hungry, cough often, get headaches, or have difficulty concentrating. The withdrawal symptoms are only temporary. They are strongest when you first quit but will go away within 10-14 days. When withdrawal symptoms occur, stay in control. Think about your reasons for quitting. Remind yourself that these are signs that your body is healing and getting used to being without cigarettes. Remember that withdrawal symptoms are easier to treat than the major diseases that smoking can cause.   Even after the withdrawal is over, expect periodic urges to smoke. However, these cravings are generally short lived and will go away whether you smoke or not. Do not smoke!  WHAT RESOURCES ARE AVAILABLE TO HELP ME QUIT SMOKING?  Your health care provider can direct you to community resources or hospitals for support, which may include:  · Group support.  · Education.  · Hypnosis.  · Therapy.  Document Released: 10/28/2003 Document Revised: 06/15/2013 Document Reviewed: 07/17/2012  ExitCare® Patient Information ©2015 [employer]. This information is not intended to replace advice given to you by your health care provider. Make sure you discuss any questions you have with your health care provider.

## 2014-02-23 NOTE — Progress Notes (Signed)
[facility] [phone]   Fax:(336) [phone]  OFFICE PROGRESS NOTE  Pcp Not In System No address on file  DIAGNOSIS: Mild leukocytopenia and neutropenia, probably drug induced  PRIOR THERAPY: None  CURRENT THERAPY: Observation  INTERVAL HISTORY: Lindsey Velazquez 24 y.o. female returns to the clinic today for two-month follow-up visit. The patient is feeling fine today with no specific complaints. She denied having any significant weight loss or night sweats. She has no fever or chills, no nausea or vomiting. She denied having any significant chest pain, shortness of breath, cough or hemoptysis. She was evaluated 2 months ago for leukocytopenia and neutropenia. That was felt to be a drug-induced secondary to her previous treatment with antibiotics. She was monitored by observation. She came today for reevaluation with repeat CBC.  MEDICAL HISTORY: Past Medical History  Diagnosis Date  . Eczema   . Anxiety   . Depression     ALLERGIES:  is allergic to clindamycin/lincomycin and penicillins.  MEDICATIONS:  No current outpatient prescriptions on file.   No current facility-administered medications for this visit.    SURGICAL HISTORY:  Past Surgical History  Procedure Laterality Date  . No past surgeries      REVIEW OF SYSTEMS:  A comprehensive review of systems was negative.   PHYSICAL EXAMINATION: General appearance: alert, cooperative and no distress Head: Normocephalic, without obvious abnormality, atraumatic Neck: no adenopathy, no JVD, supple, symmetrical, trachea midline and thyroid not enlarged, symmetric, no tenderness/mass/nodules Lymph nodes: Cervical, supraclavicular, and axillary nodes normal. Resp: clear to auscultation bilaterally Back: symmetric, no curvature. ROM normal. No CVA tenderness. Cardio: regular rate and rhythm, S1, S2 normal, no murmur, click, rub or gallop GI: soft, non-tender; bowel sounds normal; no masses,  no  organomegaly Extremities: extremities normal, atraumatic, no cyanosis or edema  ECOG PERFORMANCE STATUS: 0 - Asymptomatic  Blood pressure 105/68, pulse 60, temperature 98.3 F (36.8 C), temperature source Oral, resp. rate 18, height 5' (1.524 m), weight 109 lb (49.442 kg), SpO2 100 %, not currently breastfeeding.  LABORATORY DATA: Lab Results  Component Value Date   WBC 3.7* 02/23/2014   HGB 12.0 02/23/2014   HCT 36.8 02/23/2014   MCV 95.6 02/23/2014   PLT 201 02/23/2014      Chemistry      Component Value Date/Time   NA 139 12/09/2013 1434   NA 140 05/18/2013 1316   K 3.3* 12/09/2013 1434   K 2.9* 05/18/2013 1340   CL 100 05/18/2013 1316   CO2 24 12/09/2013 1434   CO2 18* 01/20/2009 1755   BUN 9.9 12/09/2013 1434   BUN 8 05/18/2013 1316   CREATININE 0.8 12/09/2013 1434   CREATININE 0.90 05/18/2013 1316      Component Value Date/Time   CALCIUM 8.8 12/09/2013 1434   CALCIUM 8.7 01/20/2009 1755   ALKPHOS 50 12/09/2013 1434   ALKPHOS 34* 01/20/2009 1755   AST 14 12/09/2013 1434   AST 37 01/20/2009 1755   ALT 13 12/09/2013 1434   ALT 14 01/20/2009 1755   BILITOT 0.60 12/09/2013 1434   BILITOT 1.0 01/20/2009 1755       RADIOGRAPHIC STUDIES: No results found.  ASSESSMENT AND PLAN: This is a very pleasant 24 years old African-American female with mild leukocytopenia and neutropenia, questionable drug-induced. Her CBC today showed improvement in her total white blood count as well as absolute neutrophil count. I recommended for the patient to continue on observation with repeat CBC and LDH in 3  months. She was advised to call immediately if she has any concerning symptoms in the interval. The patient voices understanding of current disease status and treatment options and is in agreement with the current care plan.  All questions were answered. The patient knows to call the clinic with any problems, questions or concerns. We can certainly see the patient much sooner if  necessary.  Disclaimer: This note was dictated with voice recognition software. Similar sounding words can inadvertently be transcribed and may not be corrected upon review.

## 2014-02-23 NOTE — Telephone Encounter (Signed)
Gave avs & cal for April. °

## 2014-04-23 ENCOUNTER — Ambulatory Visit: Payer: Medicaid Other | Admitting: Obstetrics & Gynecology

## 2014-05-25 ENCOUNTER — Ambulatory Visit: Payer: Self-pay | Admitting: Internal Medicine

## 2014-05-25 ENCOUNTER — Other Ambulatory Visit: Payer: Self-pay | Admitting: Medical Oncology

## 2014-05-25 ENCOUNTER — Other Ambulatory Visit: Payer: Self-pay

## 2014-05-25 ENCOUNTER — Telehealth: Payer: Self-pay | Admitting: Internal Medicine

## 2014-05-25 NOTE — Telephone Encounter (Signed)
s.w. pt and gv new appt....pt ok and aware °

## 2014-05-31 ENCOUNTER — Emergency Department (HOSPITAL_COMMUNITY)
Admission: EM | Admit: 2014-05-31 | Discharge: 2014-05-31 | Disposition: A | Discharge status: Home / Self Care | Payer: Medicaid Other | Attending: Emergency Medicine | Admitting: Emergency Medicine

## 2014-05-31 ENCOUNTER — Encounter (HOSPITAL_COMMUNITY): Payer: Self-pay | Admitting: *Deleted

## 2014-05-31 DIAGNOSIS — K529 Noninfective gastroenteritis and colitis, unspecified: Secondary | ICD-10-CM | POA: Insufficient documentation

## 2014-05-31 DIAGNOSIS — Z720 Tobacco use: Secondary | ICD-10-CM | POA: Diagnosis not present

## 2014-05-31 DIAGNOSIS — Z3202 Encounter for pregnancy test, result negative: Secondary | ICD-10-CM | POA: Diagnosis not present

## 2014-05-31 DIAGNOSIS — Z8659 Personal history of other mental and behavioral disorders: Secondary | ICD-10-CM | POA: Insufficient documentation

## 2014-05-31 DIAGNOSIS — R110 Nausea: Secondary | ICD-10-CM

## 2014-05-31 DIAGNOSIS — R109 Unspecified abdominal pain: Secondary | ICD-10-CM | POA: Diagnosis present

## 2014-05-31 DIAGNOSIS — Z872 Personal history of diseases of the skin and subcutaneous tissue: Secondary | ICD-10-CM | POA: Diagnosis not present

## 2014-05-31 LAB — URINALYSIS, ROUTINE W REFLEX MICROSCOPIC
Glucose, UA: NEGATIVE mg/dL
Ketones, ur: >80 mg/dL — AB
Nitrite: NEGATIVE
PROTEIN: 30 mg/dL — AB
Specific Gravity, Urine: 1.029 (ref 1.005–1.030)
UROBILINOGEN UA: 1 mg/dL (ref 0.0–1.0)
pH: 5.5 (ref 5.0–8.0)

## 2014-05-31 LAB — CBC WITH DIFFERENTIAL/PLATELET
BASOS ABS: 0 10*3/uL (ref 0.0–0.1)
Basophils Relative: 0 % (ref 0–1)
Eosinophils Absolute: 0 10*3/uL (ref 0.0–0.7)
Eosinophils Relative: 1 % (ref 0–5)
HEMATOCRIT: 36.8 % (ref 36.0–46.0)
Hemoglobin: 12.6 g/dL (ref 12.0–15.0)
Lymphocytes Relative: 37 % (ref 12–46)
Lymphs Abs: 1.4 10*3/uL (ref 0.7–4.0)
MCH: 31.3 pg (ref 26.0–34.0)
MCHC: 34.2 g/dL (ref 30.0–36.0)
MCV: 91.5 fL (ref 78.0–100.0)
Monocytes Absolute: 0.3 10*3/uL (ref 0.1–1.0)
Monocytes Relative: 6 % (ref 3–12)
NEUTROS ABS: 2.2 10*3/uL (ref 1.7–7.7)
Neutrophils Relative %: 56 % (ref 43–77)
PLATELETS: 266 10*3/uL (ref 150–400)
RBC: 4.02 MIL/uL (ref 3.87–5.11)
RDW: 13.4 % (ref 11.5–15.5)
WBC: 3.9 10*3/uL — AB (ref 4.0–10.5)

## 2014-05-31 LAB — COMPREHENSIVE METABOLIC PANEL
ALK PHOS: 42 U/L (ref 39–117)
ALT: 11 U/L (ref 0–35)
ANION GAP: 11 (ref 5–15)
AST: 19 U/L (ref 0–37)
Albumin: 3.7 g/dL (ref 3.5–5.2)
BUN: 14 mg/dL (ref 6–23)
CO2: 19 mmol/L (ref 19–32)
Calcium: 9 mg/dL (ref 8.4–10.5)
Chloride: 106 mmol/L (ref 96–112)
Creatinine, Ser: 0.73 mg/dL (ref 0.50–1.10)
GLUCOSE: 94 mg/dL (ref 70–99)
Potassium: 3.5 mmol/L (ref 3.5–5.1)
Sodium: 136 mmol/L (ref 135–145)
TOTAL PROTEIN: 7.7 g/dL (ref 6.0–8.3)
Total Bilirubin: 1 mg/dL (ref 0.3–1.2)

## 2014-05-31 LAB — LIPASE, BLOOD: Lipase: 44 U/L (ref 11–59)

## 2014-05-31 LAB — POC URINE PREG, ED: Preg Test, Ur: NEGATIVE

## 2014-05-31 LAB — URINE MICROSCOPIC-ADD ON

## 2014-05-31 MED ORDER — ONDANSETRON 4 MG PO TBDP: 4.0000 mg | ORAL_TABLET | Freq: Three times a day (TID) | ORAL | Status: DC | PRN

## 2014-05-31 NOTE — Discharge Instructions (Signed)
We saw you in the ER for the nausea. All the results in the ER are normal. We are not sure what is causing your symptoms. The workup in the ER is not complete, and is limited to screening for life threatening and emergent conditions only, so please see a primary care doctor for further evaluation.   Viral Gastroenteritis Viral gastroenteritis is also known as stomach flu. This condition affects the stomach and intestinal tract. It can cause sudden diarrhea and vomiting. The illness typically lasts 3 to 8 days. Most people develop an immune response that eventually gets rid of the virus. While this natural response develops, the virus can make you quite ill. CAUSES  Many different viruses can cause gastroenteritis, such as rotavirus or noroviruses. You can catch one of these viruses by consuming contaminated food or water. You may also catch a virus by sharing utensils or other personal items with an infected person or by touching a contaminated surface. SYMPTOMS  The most common symptoms are diarrhea and vomiting. These problems can cause a severe loss of body fluids (dehydration) and a body salt (electrolyte) imbalance. Other symptoms may include:  Fever.  Headache.  Fatigue.  Abdominal pain. DIAGNOSIS  Your caregiver can usually diagnose viral gastroenteritis based on your symptoms and a physical exam. A stool sample may also be taken to test for the presence of viruses or other infections. TREATMENT  This illness typically goes away on its own. Treatments are aimed at rehydration. The most serious cases of viral gastroenteritis involve vomiting so severely that you are not able to keep fluids down. In these cases, fluids must be given through an intravenous line (IV). HOME CARE INSTRUCTIONS   Drink enough fluids to keep your urine clear or pale yellow. Drink small amounts of fluids frequently and increase the amounts as tolerated.  Ask your caregiver for specific rehydration  instructions.  Avoid:  Foods high in sugar.  Alcohol.  Carbonated drinks.  Tobacco.  Juice.  Caffeine drinks.  Extremely hot or cold fluids.  Fatty, greasy foods.  Too much intake of anything at one time.  Dairy products until 24 to 48 hours after diarrhea stops.  You may consume probiotics. Probiotics are active cultures of beneficial bacteria. They may lessen the amount and number of diarrheal stools in adults. Probiotics can be found in yogurt with active cultures and in supplements.  Wash your hands well to avoid spreading the virus.  Only take over-the-counter or prescription medicines for pain, discomfort, or fever as directed by your caregiver. Do not give aspirin to children. Antidiarrheal medicines are not recommended.  Ask your caregiver if you should continue to take your regular prescribed and over-the-counter medicines.  Keep all follow-up appointments as directed by your caregiver. SEEK IMMEDIATE MEDICAL CARE IF:   You are unable to keep fluids down.  You do not urinate at least once every 6 to 8 hours.  You develop shortness of breath.  You notice blood in your stool or vomit. This may look like coffee grounds.  You have abdominal pain that increases or is concentrated in one small area (localized).  You have persistent vomiting or diarrhea.  You have a fever.  The patient is a child younger than 3 months, and he or she has a fever.  The patient is a child older than 3 months, and he or she has a fever and persistent symptoms.  The patient is a child older than 3 months, and he or she has a  fever and symptoms suddenly get worse.  The patient is a baby, and he or she has no tears when crying. MAKE SURE YOU:   Understand these instructions.  Will watch your condition.  Will get help right away if you are not doing well or get worse. Document Released: 01/29/2005 Document Revised: 04/23/2011 Document Reviewed: 11/15/2010 [facility] Patient  Information 2015 [place], [place]. This information is not intended to replace advice given to you by your health care provider. Make sure you discuss any questions you have with your health care provider.

## 2014-05-31 NOTE — ED Provider Notes (Signed)
CSN: [id number]     Arrival date & time 05/31/14  0203 History   First MD Initiated Contact with Patient 05/31/14 0223     This chart was scribed for No att. providers found by Arlan OrganAshley Leger, ED Scribe. This patient was seen in room D36C/D36C and the patient's care was started 2:40 AM.   Chief Complaint  Patient presents with  . Abdominal Pain   The history is provided by the patient. No language interpreter was used.    HPI Comments: Lindsey Velazquez is a 24 y.o. female with a PMHx of anxiety and depression who presents to the Emergency Department complaining of intermittent, ongoing, unchanged abdominal pain x 2 days. Pain is described as "crampy". Episodes last several minutes when present. Pt also reports nausea and diarrhea. Ms. Lindsey Velazquez admits to 3-4 episodes of loose stools in last 24 hours. Pt was given Phenergan at her job without any improvement for symptoms. No dysuria, hematuria, or urinary issues. No recent fever or chills. LNMP yesterday. Pt with known allergies to Penicillins and Clindamycin.  Past Medical History  Diagnosis Date  . Eczema   . Anxiety   . Depression    Past Surgical History  Procedure Laterality Date  . No past surgeries     Family History  Problem Relation Age of Onset  . Diabetes Mother   . Thyroid disease Sister    History  Substance Use Topics  . Smoking status: Current Every Day Smoker    Types: Cigarettes  . Smokeless tobacco: Never Used  . Alcohol Use: Yes   OB History    Gravida Para Term Preterm AB TAB SAB Ectopic Multiple Living   4 3 2 1 1  0 1 0 0 3     Review of Systems  Constitutional: Negative for fever and chills.  Gastrointestinal: Positive for nausea, abdominal pain and diarrhea. Negative for vomiting.  Genitourinary: Negative for dysuria and hematuria.  Skin: Negative for rash.  Psychiatric/Behavioral: Negative for confusion.      Allergies  Clindamycin/lincomycin and Penicillins  Home Medications   Prior to  Admission medications   Medication Sig Start Date End Date Taking? Authorizing Provider  ondansetron (ZOFRAN-ODT) 4 MG disintegrating tablet Take 1 tablet (4 mg total) by mouth every 8 (eight) hours as needed for nausea. 05/31/14   Derwood KaplanAnkit Nabil Bubolz, MD   Triage Vitals: BP 108/72 mmHg  Pulse 65  Temp(Src) 98.3 F (36.8 C) (Oral)  Resp 16  Ht 4\' 11"  (1.499 m)  Wt 110 lb (49.896 kg)  BMI 22.21 kg/m2  SpO2 100%  LMP 05/27/2014   Physical Exam  Constitutional: She is oriented to person, place, and time. She appears well-developed and well-nourished. No distress.  HENT:  Head: Normocephalic and atraumatic.  Eyes: EOM are normal.  Neck: Normal range of motion.  Cardiovascular: Normal rate, regular rhythm and normal heart sounds.   Pulmonary/Chest: Effort normal and breath sounds normal.  Abdominal: Soft. She exhibits no distension. There is no tenderness.  Positive bowel sounds  No tenderness to palpation   Musculoskeletal: Normal range of motion.  Neurological: She is alert and oriented to person, place, and time.  Skin: Skin is warm and dry.  Psychiatric: She has a normal mood and affect. Judgment normal.  Nursing note and vitals reviewed.   ED Course  Procedures (including critical care time)  DIAGNOSTIC STUDIES: Oxygen Saturation is 99% on RA, Normal by my interpretation.    COORDINATION OF CARE: 2:40 AM-Discussed treatment plan with pt at bedside  and pt agreed to plan.     Labs Review Labs Reviewed  CBC WITH DIFFERENTIAL/PLATELET - Abnormal; Notable for the following:    WBC 3.9 (*)    All other components within normal limits  URINALYSIS, ROUTINE W REFLEX MICROSCOPIC - Abnormal; Notable for the following:    Color, Urine AMBER (*)    APPearance CLOUDY (*)    Hgb urine dipstick LARGE (*)    Bilirubin Urine SMALL (*)    Ketones, ur >80 (*)    Protein, ur 30 (*)    Leukocytes, UA MODERATE (*)    All other components within normal limits  COMPREHENSIVE METABOLIC PANEL   LIPASE, BLOOD  URINE MICROSCOPIC-ADD ON  POC URINE PREG, ED    Imaging Review No results found.   EKG Interpretation None      MDM   Final diagnoses:  Nausea  Gastroenteritis    I personally performed the services described in this documentation, which was scribed in my presence. The recorded information has been reviewed and is accurate.  PT with cc of abd pain, nausea, emesis, loose BM. No fevers, travels, and pt is immunocompetent. She has some ketones in the urine -and thus likely has mild dehydration. Will get sx in control. Anticipate d/c.   Derwood Kaplan, MD 06/02/14 [phone]

## 2014-05-31 NOTE — ED Notes (Signed)
Pt c/o abd pain today .  lmp 3 days ago

## 2014-06-03 ENCOUNTER — Ambulatory Visit: Payer: Self-pay | Admitting: Internal Medicine

## 2014-06-03 ENCOUNTER — Other Ambulatory Visit: Payer: Self-pay

## 2014-08-04 ENCOUNTER — Encounter (HOSPITAL_COMMUNITY): Payer: Self-pay

## 2014-08-04 ENCOUNTER — Emergency Department (HOSPITAL_COMMUNITY): Payer: Medicaid Other

## 2014-08-04 ENCOUNTER — Emergency Department (HOSPITAL_COMMUNITY)
Admission: EM | Admit: 2014-08-04 | Discharge: 2014-08-04 | Disposition: A | Discharge status: Home / Self Care | Payer: Medicaid Other | Attending: Emergency Medicine | Admitting: Emergency Medicine

## 2014-08-04 DIAGNOSIS — T1490XA Injury, unspecified, initial encounter: Secondary | ICD-10-CM

## 2014-08-04 DIAGNOSIS — F1210 Cannabis abuse, uncomplicated: Secondary | ICD-10-CM | POA: Insufficient documentation

## 2014-08-04 DIAGNOSIS — W1839XA Other fall on same level, initial encounter: Secondary | ICD-10-CM | POA: Insufficient documentation

## 2014-08-04 DIAGNOSIS — R55 Syncope and collapse: Secondary | ICD-10-CM

## 2014-08-04 DIAGNOSIS — Z872 Personal history of diseases of the skin and subcutaneous tissue: Secondary | ICD-10-CM | POA: Insufficient documentation

## 2014-08-04 DIAGNOSIS — Y9389 Activity, other specified: Secondary | ICD-10-CM | POA: Insufficient documentation

## 2014-08-04 DIAGNOSIS — N390 Urinary tract infection, site not specified: Secondary | ICD-10-CM | POA: Insufficient documentation

## 2014-08-04 DIAGNOSIS — Y998 Other external cause status: Secondary | ICD-10-CM | POA: Insufficient documentation

## 2014-08-04 DIAGNOSIS — F1410 Cocaine abuse, uncomplicated: Secondary | ICD-10-CM | POA: Insufficient documentation

## 2014-08-04 DIAGNOSIS — Z720 Tobacco use: Secondary | ICD-10-CM | POA: Insufficient documentation

## 2014-08-04 DIAGNOSIS — Z880 Allergy status to penicillin: Secondary | ICD-10-CM | POA: Insufficient documentation

## 2014-08-04 DIAGNOSIS — Z043 Encounter for examination and observation following other accident: Secondary | ICD-10-CM | POA: Insufficient documentation

## 2014-08-04 DIAGNOSIS — Y92008 Other place in unspecified non-institutional (private) residence as the place of occurrence of the external cause: Secondary | ICD-10-CM | POA: Insufficient documentation

## 2014-08-04 DIAGNOSIS — R4182 Altered mental status, unspecified: Secondary | ICD-10-CM

## 2014-08-04 LAB — COMPREHENSIVE METABOLIC PANEL
ALK PHOS: 46 U/L (ref 38–126)
ALT: 11 U/L — AB (ref 14–54)
AST: 25 U/L (ref 15–41)
Albumin: 3.2 g/dL — ABNORMAL LOW (ref 3.5–5.0)
Anion gap: 10 (ref 5–15)
BUN: 8 mg/dL (ref 6–20)
CHLORIDE: 110 mmol/L (ref 101–111)
CO2: 21 mmol/L — AB (ref 22–32)
Calcium: 8.8 mg/dL — ABNORMAL LOW (ref 8.9–10.3)
Creatinine, Ser: 0.92 mg/dL (ref 0.44–1.00)
GLUCOSE: 135 mg/dL — AB (ref 65–99)
Potassium: 3.2 mmol/L — ABNORMAL LOW (ref 3.5–5.1)
Sodium: 141 mmol/L (ref 135–145)
Total Bilirubin: 0.6 mg/dL (ref 0.3–1.2)
Total Protein: 6.8 g/dL (ref 6.5–8.1)

## 2014-08-04 LAB — CBC WITH DIFFERENTIAL/PLATELET
BASOS ABS: 0 10*3/uL (ref 0.0–0.1)
BASOS PCT: 0 % (ref 0–1)
EOS PCT: 0 % (ref 0–5)
Eosinophils Absolute: 0 10*3/uL (ref 0.0–0.7)
HCT: 39.8 % (ref 36.0–46.0)
Hemoglobin: 13.8 g/dL (ref 12.0–15.0)
Lymphocytes Relative: 68 % — ABNORMAL HIGH (ref 12–46)
Lymphs Abs: 3.7 10*3/uL (ref 0.7–4.0)
MCH: 32.5 pg (ref 26.0–34.0)
MCHC: 34.7 g/dL (ref 30.0–36.0)
MCV: 93.6 fL (ref 78.0–100.0)
MONO ABS: 0.2 10*3/uL (ref 0.1–1.0)
MONOS PCT: 4 % (ref 3–12)
NEUTROS PCT: 28 % — AB (ref 43–77)
Neutro Abs: 1.5 10*3/uL — ABNORMAL LOW (ref 1.7–7.7)
Platelets: 214 10*3/uL (ref 150–400)
RBC: 4.25 MIL/uL (ref 3.87–5.11)
RDW: 14.2 % (ref 11.5–15.5)
WBC: 5.5 10*3/uL (ref 4.0–10.5)

## 2014-08-04 LAB — I-STAT BETA HCG BLOOD, ED (MC, WL, AP ONLY): I-stat hCG, quantitative: <5 m[IU]/mL (ref <5)

## 2014-08-04 LAB — URINALYSIS, ROUTINE W REFLEX MICROSCOPIC
Bilirubin Urine: NEGATIVE
GLUCOSE, UA: NEGATIVE mg/dL
Ketones, ur: NEGATIVE mg/dL
NITRITE: POSITIVE — AB
PH: 6 (ref 5.0–8.0)
Protein, ur: 100 mg/dL — AB
Specific Gravity, Urine: 1.022 (ref 1.005–1.030)
Urobilinogen, UA: 1 mg/dL (ref 0.0–1.0)

## 2014-08-04 LAB — RAPID URINE DRUG SCREEN, HOSP PERFORMED
Amphetamines: NOT DETECTED
BARBITURATES: NOT DETECTED
BENZODIAZEPINES: NOT DETECTED
COCAINE: POSITIVE — AB
OPIATES: NOT DETECTED
Tetrahydrocannabinol: POSITIVE — AB

## 2014-08-04 LAB — URINE MICROSCOPIC-ADD ON

## 2014-08-04 LAB — ACETAMINOPHEN LEVEL: Acetaminophen (Tylenol), Serum: <10 ug/mL — ABNORMAL LOW (ref 10–30)

## 2014-08-04 LAB — SALICYLATE LEVEL: Salicylate Lvl: <4 mg/dL (ref 2.8–30.0)

## 2014-08-04 MED ORDER — DEXTROSE 5 % IV SOLN
1.0000 g | Freq: Once | INTRAVENOUS | Status: AC
  Administered 2014-08-04: 1 g via INTRAVENOUS
  Filled 2014-08-04: qty 10

## 2014-08-04 MED ORDER — CEPHALEXIN 500 MG PO CAPS: 500.0000 mg | ORAL_CAPSULE | Freq: Four times a day (QID) | ORAL | Status: DC

## 2014-08-04 MED ORDER — IBUPROFEN 800 MG PO TABS: 800.0000 mg | ORAL_TABLET | Freq: Three times a day (TID) | ORAL | Status: DC

## 2014-08-04 NOTE — ED Notes (Signed)
C-Collar removed per Dr. Rondel Baton order, crackers and ginger-ale given.

## 2014-08-04 NOTE — Discharge Instructions (Signed)
Please call your doctor for a followup appointment within 24-48 hours. When you talk to your doctor please let them know that you were seen in the emergency department and have them acquire all of your records so that they can discuss the findings with you and formulate a treatment plan to fully care for your new and ongoing problems. ° ° °Emergency Department Resource Guide °1) Find a Doctor and Pay Out of Pocket °Although you won't have to find out who is covered by your insurance plan, it is a good idea to ask around and get recommendations. You will then need to call the office and see if the doctor you have chosen will accept you as a new patient and what types of options they offer for patients who are self-pay. Some doctors offer discounts or will set up payment plans for their patients who do not have insurance, but you will need to ask so you aren't surprised when you get to your appointment. ° °2) Contact Your Local Health Department °Not all health departments have doctors that can see patients for sick visits, but many do, so it is worth a call to see if yours does. If you don't know where your local health department is, you can check in your phone book. The CDC also has a tool to help you locate your state's health department, and many state websites also have listings of all of their local health departments. ° °3) Find a Walk-in Clinic °If your illness is not likely to be very severe or complicated, you may want to try a walk in clinic. These are popping up all over the country in pharmacies, drugstores, and shopping centers. They're usually staffed by nurse practitioners or physician assistants that have been trained to treat common illnesses and complaints. They're usually fairly quick and inexpensive. However, if you have serious medical issues or chronic medical problems, these are probably not your best option. ° °No Primary Care Doctor: °- Call [facility] at  832-8000 - they can help you  locate a primary care doctor that  accepts your insurance, provides certain services, etc. °- Physician Referral Service- 1-800-533-3463 ° °Chronic Pain Problems: °Organization         Address  Phone   Notes  °[facility]  (336) 297-2271 Patients need to be referred by their primary care doctor.  ° °Medication Assistance: °Organization         Address  Phone   Notes  °[place] [facility] [address] °[place], [place] [address] (336) 641-8030 --Must be a resident of [place] °-- Must have NO insurance coverage whatsoever (no Medicaid/ Medicare, etc.) °-- The pt. MUST have a primary care doctor that directs their care regularly and follows them in the community °  °[facility]  (866) 331-1348   °[employer]  (888) 892-1162   ° °Agencies that provide inexpensive medical care: °Organization         Address  Phone   Notes  °[facility]  (336) 832-8035   °[facility]    (336) 832-7272   °[facility] [address] °[place], [place] [address] (336) 832-4777   °[facility] [address], °[place] (336) 271-4999   °[facility]    (336) 373-0678   °[facility]    (336) 272-1050   °[facility] ° [address], [place] Phone:  (336)   832-4444, Fax:  (336) 832-4440 Hours of Operation:  9 am - 6 pm, M-F.  Also accepts Medicaid/Medicare and self-pay.  °[facility] ° [address], [place] Phone: (336) 832-3150, Fax: (336) 832-3151. Hours of Operation:  8:30 am - 5:30 pm, M-F.  Also accepts Medicaid and self-pay.  °[facility] [place] [address], [place] Phone: (336) 878-6027   °[facility] [address], [place], [place] (336)723-1848, Ext. 123 Mondays & Thursdays: 7-9 AM.  First 15 patients are seen on a first come, first serve basis. °  ° °Medicaid-accepting [place]  Providers: ° °Organization         Address  Phone   Notes  °[facility] [address], [place] (336) 641-2100 Also accepts self-pay patients.  °[facility] [address], [place] ° (336) 856-9996   °[facility] [address], [place] (336) 288-8857   °[facility] [address] [place] [address], [place] (336) 299-7000   °Veita Bland [address], [place]  ° (336) 373-1557 Only accepts [place] Access Medicaid patients after they have their name applied to their card.  ° °Self-Pay (no insurance) in [place]: ° °Organization         Address  Phone   Notes  °Sickle Cell Patients, [facility] [address], [place] (336) 832-1970   °[facility] [address], [place] (336) 832-4400   °[facility] ° [address], [place] (336) 992-4800   °[facility]/Dr. Osei-Bonsu ° [address] [place] [address], [place] or [address], [place] (336) 841-8500 Phone number for both [place] and [place] locations is the same.  °[facility] [address], [place] (336) 299-0000   °[facility] [address] [place] [address], [place] or [address] (336) 852-7530 °(336) 878-2260   °[facility] [address], [place] (336) 350-1642, phone; (336) 294-5005, fax Sees patients 1st and 3rd Saturday of every month.  Must not qualify for public or private insurance (i.e. Medicaid, Medicare, [place] Health Choice, Veterans' Benefits) • Household income should be no more than 200% of the poverty level •The clinic cannot treat you if you are pregnant or think you are pregnant • Sexually transmitted diseases are not treated at the clinic.  ° ° °Dental Care: °Organization         Address  Phone  Notes  °[facility] [address], [place] (336) 641-6152 Accepts children up to age 21 who are enrolled in Medicaid or [place] Health Choice; pregnant women with a Medicaid card; and children who have applied for Medicaid or [place] Health Choice, but were declined, whose parents can pay a reduced fee at time of service.  °[facility] [place]  [address], [place] (336) 641-7733 Accepts children up to age 21 who are enrolled in Medicaid or [place] Health Choice; pregnant women with a Medicaid card; and children who have applied for Medicaid or [place] Health Choice, but were declined, whose parents can pay a reduced fee at time of service.  °[facility] ° [address],  (336) 641-4533 Patients are seen by appointment only. Walk-ins are   not accepted. [facility] will see patients 18 years of age and older. °Monday - Tuesday (8am-5pm) °Most Wednesdays (8:30-5pm) °$30 per visit, cash only  °[facility] ° [address], [place] (336) 641-4533 Patients are seen by appointment only. Walk-ins are not accepted. [facility] will see patients 18 years of age and older. °One Wednesday Evening (Monthly: Volunteer Based).  $30 per visit, cash only  °[facility]  (919) 537-3737 for adults; Children under age 4, call Graduate Pediatric Dentistry at (919) [phone]. Children aged 4-14, please call (919) [phone] to request a pediatric application. ° Dental services are provided in all areas of dental care including fillings, crowns and bridges, complete and partial dentures, implants, gum treatment, root canals, and extractions. Preventive care is also provided. Treatment is provided to both adults and children. °Patients are selected via a lottery and there is often a waiting list. °  °[facility] [address], °[place] ° (336) 763-8833 [email or web address] °  °[facility]  [address], [place], [place] (336)723-1848, Ext. 123 Second and Fourth Thursday of each month, opens at 6:30 AM; Clinic ends at 9 AM.  Patients are seen on a first-come first-served basis, and a limited number are seen during each clinic.  ° °[facility] ° [address], [place], [place] (336) 723-7904   Eligibility Requirements °You must have lived in [place], [place], or [place] counties for at least the last three months. °  You cannot be eligible for state or federal sponsored healthcare insurance, including Veterans Administration, Medicaid, or Medicare. °  You generally cannot be eligible for healthcare insurance through your employer.  °  How to apply: °Eligibility screenings are held every Tuesday and Wednesday afternoon from 1:00 pm until 4:00 pm. You do not need an appointment for the interview!  °[facility] [address], [place], [place] 336-631-2330   °[facility]  336-342-8273   °[facility]  336-703-3100   °[facility]  336-570-6415   ° °Behavioral Health Resources in the Community: °Intensive Outpatient Programs °Organization         Address  Phone  Notes  °[facility] [address], [place], [place] 336-878-6098   °[facility] [address], [place], [place] 336-832-9800   °[facility] [address], [place], [place] ° 336-882-2125   °[facility] [address],  °[place], [place] 1-800-853-5163 or 336-641-4981   °Substance Abuse Resources °Organization         Address  Phone  Notes  °[facility]  336-882-2125   °[facility]  336-784-9470   °[facility]  336-285-9073   °[facility]  336-845-3988   °Residential & Outpatient Substance Abuse Program  1-800-659-3381   °Psychological Services °Organization         Address  Phone  Notes  °[facility]  336- 832-9600     °[facility]  336- 378-7881   °[facility] [address], [place] 1-800-853-5163 or 336-641-4981   ° °Mobile Crisis Teams °Organization         Address  Phone  Notes  °[facility], Mobile Crisis Care Unit  1-877-626-1772   °Assertive °[facility] ° [address] , [place] 336-834-9664   °Sharon DeEsch [address] ° [place] 336-554-5454   ° °  Self-Help/Support Groups °Organization         Address  Phone             Notes  °[facility] [place] - variety of support groups  336- 373-1402 Call for more information  °Narcotics Anonymous (NA), [facility] [address], °[place]  2 meetings at this location  ° °Residential Treatment Programs °Organization         Address  Phone  Notes  °[facility] [address],    °[place]  1-866-801-8205   °[facility] ° [address], [place], [place] 704-293-8524   °[facility] [address], [place] 336-845-3988 Admissions: 8am-3pm M-F  °[facility] [address]    °[place], [place] 336-841-1104   °[facility] [address], [place], [place] 336-379-7146   °[facility] [address]  °[place], [place] 336-285-9073   °[facility] Intensive Outpatient [address], [place], [place] 336-852-3033   °[facility] [address]  °[place], [place] 1-877-615-2722 or 336-784-9470   °[facility] [address] [place], [place] 336-227-7417 Accepts Medicaid  °[facility] [address]  ° [place] 1-800-659-3381 Substance Abuse/Addiction Treatment  ° °[place] Behavioral Health Resources °Organization         Address  Phone  Notes  °[facility]  (888) 581-9988   °Julie Brannon, PhD [address] [place], [place]   (336) 349-5553 or (336) 951-0000    °[facility]   [address] °[place], [place] (336) 349-4454   °[facility] [address], [place], [place] (336) 342-8316 Insurance/Medicaid/sponsorship through [facility]  °[facility] [address]                                    [place], [place] (336) 342-8316 Therapy/tele-psych/case  °[facility] [address]  ° [place], [place] (336) 349-2233    °Dr. Arfeen  (336) 349-4544   °[facility] [place]  [employer] [place] [facility] 1) [address], [place] °2) [address], [place] °3)  [address], [place] (336) 349-3220 °(336) 342-7768 ° °(336) 342-8140   °[place] [facility] (336) 342-1394 or (336) 342-3537 (After Hours)    ° ° ° °

## 2014-08-04 NOTE — ED Notes (Signed)
Pt ambulated to the bathroom.  

## 2014-08-04 NOTE — ED Notes (Signed)
Per [facility], pt from home after falling after getting out of the shower. Pt has 3 small children that called 911 and her father. Pt was incontinent of bowels and bladder. Was unresponsive to EMS upon arrival. Was given 2 mg Narcan with minimal change. 20g to [facility].

## 2014-08-04 NOTE — ED Notes (Signed)
Children at the bedside. Son states, "I called 911"

## 2014-08-04 NOTE — ED Notes (Signed)
Pt returned from CT at this time.  

## 2014-08-04 NOTE — ED Provider Notes (Signed)
CSN: [id number]     Arrival date & time 08/04/14  0910 History   First MD Initiated Contact with Patient 08/04/14 0914     Chief Complaint  Patient presents with  . 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  The patient has no known history of seizures, has not been seen in the hospital for similar symptoms in the past, was unable to give the paramedics any information. She was given Narcan without improvement, glucose was 150 prehospital.  Patient is a 23 y.o. female presenting with altered mental status. The history is provided by the EMS personnel.  Altered Mental Status   Past Medical History  Diagnosis Date  . Eczema   . Anxiety   . Depression    Past Surgical History  Procedure Laterality Date  . No past surgeries     Family History  Problem Relation Age of Onset  . Diabetes Mother   . Thyroid disease Sister    History  Substance Use Topics  . Smoking status: Current Every Day Smoker    Types: Cigarettes  . Smokeless tobacco: Never Used  . Alcohol Use: Yes   OB History    Gravida Para Term Preterm AB TAB SAB Ectopic Multiple Living   0 1 0 0 3     Review of Systems  Unable to perform ROS: Mental status change      Allergies  Clindamycin/lincomycin and  Penicillins  Home Medications   Prior to Admission medications   Medication Sig Start Date End Date Taking? Authorizing Provider  cephALEXin (KEFLEX) 500 MG capsule Take 1 capsule (500 mg total) by mouth 4 (four) times daily. 08/04/14   Eber Hong, MD  ibuprofen (ADVIL,MOTRIN) 800 MG tablet Take 1 tablet (800 mg total) by mouth 3 (three) times daily. 08/04/14   Eber Hong, MD  ondansetron (ZOFRAN-ODT) 4 MG disintegrating tablet Take 1 tablet (4 mg total) by mouth every 8 (eight) hours as needed for nausea. Patient not taking: Reported on 08/04/2014 05/31/14   Derwood Kaplan, MD   BP 102/61 mmHg  Pulse 88  Temp(Src) 95.6 F (35.3 C) (Rectal)  Resp 20  Wt 110 lb (49.896 kg)  SpO2 100%  LMP  (LMP Unknown) Physical Exam  Constitutional: She appears well-developed and well-nourished. She appears distressed.  HENT:  Head: Normocephalic and atraumatic.  Mouth/Throat: Oropharynx is clear and moist. No oropharyngeal exudate.  No signs of tongue biting, moves all extremities 4, follows commands, extraocular movements intact, speech is quiet but present, seems appropriate, paramedics note this is a significant improvement from prehospital setting  Eyes: Conjunctivae and EOM are normal. Pupils are equal, round, and reactive to light. Right eye exhibits no discharge. Left eye exhibits no discharge. No scleral icterus.  Neck: Normal range of motion. Neck supple. No JVD present. No thyromegaly present.  Cardiovascular: Normal rate, regular rhythm, normal heart sounds  and intact distal pulses.  Exam reveals no gallop and no friction rub.   No murmur heard. Pulmonary/Chest: Effort normal and breath sounds normal. No respiratory distress. She has no wheezes. She has no rales.  Abdominal: Soft. Bowel sounds are normal. She exhibits no distension and no mass. There is no tenderness.  Musculoskeletal: Normal range of motion. She exhibits no edema or tenderness.  Lymphadenopathy:    She has no cervical  adenopathy.  Neurological: She is alert. Coordination normal.  Skin: Skin is warm and dry. No rash noted. No erythema.  Psychiatric: She has a normal mood and affect. Her behavior is normal.  Nursing note and vitals reviewed.   ED Course  Procedures (including critical care time) Labs Review Labs Reviewed  ACETAMINOPHEN LEVEL - Abnormal; Notable for the following:    Acetaminophen (Tylenol), Serum <10 (*)    All other components within normal limits  URINALYSIS, ROUTINE W REFLEX MICROSCOPIC (NOT AT [facility]) - Abnormal; Notable for the following:    Color, Urine AMBER (*)    APPearance CLOUDY (*)    Hgb urine dipstick LARGE (*)    Protein, ur 100 (*)    Nitrite POSITIVE (*)    Leukocytes, UA MODERATE (*)    All other components within normal limits  URINE RAPID DRUG SCREEN, HOSP PERFORMED - Abnormal; Notable for the following:    Cocaine POSITIVE (*)    Tetrahydrocannabinol POSITIVE (*)    All other components within normal limits  COMPREHENSIVE METABOLIC PANEL - Abnormal; Notable for the following:    Potassium 3.2 (*)    CO2 21 (*)    Glucose, Bld 135 (*)    Calcium 8.8 (*)    Albumin 3.2 (*)    ALT 11 (*)    All other components within normal limits  CBC WITH DIFFERENTIAL/PLATELET - Abnormal; Notable for the following:    Neutrophils Relative % 28 (*)    Neutro Abs 1.5 (*)    Lymphocytes Relative 68 (*)    All other components within normal limits  URINE MICROSCOPIC-ADD ON - Abnormal; Notable for the following:    Squamous Epithelial / LPF FEW (*)    Bacteria, UA MANY (*)    All other components within normal limits  SALICYLATE LEVEL  I-STAT BETA HCG BLOOD, ED ([facility], [facility], [facility] ONLY)    Imaging Review Ct Head Wo Contrast  08/04/2014   CLINICAL DATA:  Initial encounter for fall while getting out of shower and now unresponsive.  EXAM: CT HEAD WITHOUT CONTRAST  CT CERVICAL SPINE WITHOUT CONTRAST  TECHNIQUE: Multidetector CT imaging of the head and cervical spine was performed  following the standard protocol without intravenous contrast. Multiplanar CT image reconstructions of the cervical spine were also generated.  COMPARISON:  None.  FINDINGS: CT HEAD FINDINGS  There is no evidence for acute hemorrhage, hydrocephalus, mass lesion, or abnormal extra-axial fluid collection. No definite CT evidence for acute infarction. The visualized paranasal sinuses and mastoid air cells are clear. No evidence for skull fracture.  CT CERVICAL SPINE FINDINGS  Imaging was obtained from the skullbase through the T1-2 interspace. No fracture. No subluxation. Intervertebral disc spaces are preserved. The facets are well aligned bilaterally. No prevertebral soft tissue edema. Reversal of the normal cervical lordosis is evident.  IMPRESSION: 1. Normal head CT. 2. No cervical spine fracture. 3. Loss of cervical lordosis. This can be related to patient positioning, muscle spasm or soft tissue injury.   Electronically Signed   By: Minerva Areola  Molli Posey M.D.   On: 08/04/2014 10:41   Ct Cervical Spine Wo Contrast  08/04/2014   CLINICAL DATA:  Initial encounter for fall while getting out of shower and now unresponsive.  EXAM: CT HEAD WITHOUT CONTRAST  CT CERVICAL SPINE WITHOUT CONTRAST  TECHNIQUE: Multidetector CT imaging of the head and cervical spine was performed following the standard protocol without intravenous contrast. Multiplanar CT image reconstructions of the cervical spine were also generated.  COMPARISON:  None.  FINDINGS: CT HEAD FINDINGS  There is no evidence for acute hemorrhage, hydrocephalus, mass lesion, or abnormal extra-axial fluid collection. No definite CT evidence for acute infarction. The visualized paranasal sinuses and mastoid air cells are clear. No evidence for skull fracture.  CT CERVICAL SPINE FINDINGS  Imaging was obtained from the skullbase through the T1-2 interspace. No fracture. No subluxation. Intervertebral disc spaces are preserved. The facets are well aligned bilaterally. No  prevertebral soft tissue edema. Reversal of the normal cervical lordosis is evident.  IMPRESSION: 1. Normal head CT. 2. No cervical spine fracture. 3. Loss of cervical lordosis. This can be related to patient positioning, muscle spasm or soft tissue injury.   Electronically Signed   By: Kennith Center M.D.   On: 08/04/2014 10:41      MDM   Final diagnoses:  Altered mental status  Trauma  UTI (lower urinary tract infection)  Cocaine abuse  Syncope, unspecified syncope type    Vital signs are unremarkable, her mental status is gradually improving, would consider intracranial abnormalities, seizure, consider other metabolic abnormalities, severe anemia, syncope related to other sources such as orthostatic hypotension the less likely, will provide cardiac monitoring, labs, CT imaging of the brain.  Labs reviewed with the patient, urinary tract infection found, cocaine positive, normal blood pressure and heart rate since arrival, antibodies given, stable for discharge on medications  Meds given in ED:  Medications  cefTRIAXone (ROCEPHIN) 1 g in dextrose 5 % 50 mL IVPB (0 g Intravenous Stopped 08/04/14 1418)    New Prescriptions   CEPHALEXIN (KEFLEX) 500 MG CAPSULE    Take 1 capsule (500 mg total) by mouth 4 (four) times daily.   IBUPROFEN (ADVIL,MOTRIN) 800 MG TABLET    Take 1 tablet (800 mg total) by mouth 3 (three) times daily.      Eber Hong, MD 08/04/14 [phone]

## 2014-08-04 NOTE — ED Notes (Signed)
Dr. Miller at the bedside.  

## 2014-08-04 NOTE — ED Notes (Signed)
Lab at the bedside 

## 2014-09-24 ENCOUNTER — Emergency Department (HOSPITAL_COMMUNITY): Payer: Medicaid Other

## 2014-09-24 ENCOUNTER — Encounter (HOSPITAL_COMMUNITY): Payer: Self-pay

## 2014-09-24 ENCOUNTER — Emergency Department (HOSPITAL_COMMUNITY)
Admission: EM | Admit: 2014-09-24 | Discharge: 2014-09-24 | Disposition: A | Discharge status: Home / Self Care | Payer: Medicaid Other | Attending: Emergency Medicine | Admitting: Emergency Medicine

## 2014-09-24 DIAGNOSIS — S8991XA Unspecified injury of right lower leg, initial encounter: Secondary | ICD-10-CM

## 2014-09-24 DIAGNOSIS — Z8659 Personal history of other mental and behavioral disorders: Secondary | ICD-10-CM | POA: Insufficient documentation

## 2014-09-24 DIAGNOSIS — Z872 Personal history of diseases of the skin and subcutaneous tissue: Secondary | ICD-10-CM | POA: Insufficient documentation

## 2014-09-24 DIAGNOSIS — Z720 Tobacco use: Secondary | ICD-10-CM | POA: Insufficient documentation

## 2014-09-24 DIAGNOSIS — Y9289 Other specified places as the place of occurrence of the external cause: Secondary | ICD-10-CM | POA: Insufficient documentation

## 2014-09-24 DIAGNOSIS — Y9389 Activity, other specified: Secondary | ICD-10-CM | POA: Insufficient documentation

## 2014-09-24 DIAGNOSIS — Y998 Other external cause status: Secondary | ICD-10-CM | POA: Insufficient documentation

## 2014-09-24 DIAGNOSIS — W1839XA Other fall on same level, initial encounter: Secondary | ICD-10-CM | POA: Insufficient documentation

## 2014-09-24 DIAGNOSIS — Z880 Allergy status to penicillin: Secondary | ICD-10-CM | POA: Insufficient documentation

## 2014-09-24 MED ORDER — HYDROCODONE-ACETAMINOPHEN 5-325 MG PO TABS: 1.0000 | ORAL_TABLET | Freq: Four times a day (QID) | ORAL | Status: DC | PRN

## 2014-09-24 MED ORDER — NAPROXEN 375 MG PO TABS: 375.0000 mg | ORAL_TABLET | Freq: Two times a day (BID) | ORAL | Status: DC

## 2014-09-24 MED ORDER — IBUPROFEN 400 MG PO TABS
600.0000 mg | ORAL_TABLET | Freq: Once | ORAL | Status: AC
  Administered 2014-09-24: 600 mg via ORAL
  Filled 2014-09-24: qty 2

## 2014-09-24 NOTE — ED Notes (Signed)
Patient transported to X-ray 

## 2014-09-24 NOTE — ED Notes (Signed)
Pt presents with R knee pain since last night.  Pt reports twisting injury during fall, "trying to grab something".

## 2014-09-24 NOTE — Discharge Instructions (Signed)
Your x-ray today show no broken bone, however the knee cap is slightly shifted. You will need to see the bone doctor for follow up. Call and schedule an appointment.  Do not drive while taking the narcotic as it will make you sleepy.

## 2014-09-24 NOTE — ED Provider Notes (Signed)
CSN: [id number]     Arrival date & time 09/24/14  1457 History  This chart was scribed for non-physician practitioner Kerrie Buffalo, NP working with Jerelyn Scott, MD by Murriel Hopper, ED Scribe. This patient was seen in room TR05C/TR05C and the patient's care was started at 3:04 PM.      No chief complaint on file.     Patient is a 24 y.o. female presenting with knee pain. The history is provided by the patient. No language interpreter was used.  Knee Pain Location:  Knee Time since incident:  1 day Injury: yes   Mechanism of injury: fall   Knee location:  R knee Pain details:    Radiates to:  Does not radiate   Severity:  Moderate   Onset quality:  Sudden   Duration:  1 day   Timing:  Constant   Progression:  Worsening Chronicity:  New Dislocation: no   Foreign body present:  No foreign bodies  HPI Comments: Lindsey Velazquez is a 24 y.o. female who presents to the Emergency Department complaining of constant worsening 7/10 right knee pain that has been present since last night. Pt states she was falling and reached to try to grab something to brace her fall. Pt notes when she tried to do that she twisted her knee and now states she has trouble walking on it. Pt reports her last menstrual period was today and her tetanus is UTD.      Past Medical History  Diagnosis Date  . Eczema   . Anxiety   . Depression    Past Surgical History  Procedure Laterality Date  . No past surgeries     Family History  Problem Relation Age of Onset  . Diabetes Mother   . Thyroid disease Sister    Social History  Substance Use Topics  . Smoking status: Current Every Day Smoker    Types: Cigarettes  . Smokeless tobacco: Never Used  . Alcohol Use: Yes   OB History    Gravida Para Term Preterm AB TAB SAB Ectopic Multiple Living   4 3 2 1 1  0 1 0 0 3     Review of Systems  Musculoskeletal: Positive for myalgias and arthralgias.       Right knee pain  all other systems  negative    Allergies  Clindamycin/lincomycin and Penicillins  Home Medications   Prior to Admission medications   Medication Sig Start Date End Date Taking? Authorizing Provider  HYDROcodone-acetaminophen (NORCO) 5-325 MG per tablet Take 1 tablet by mouth every 6 (six) hours as needed. 09/24/14   Hope Orlene Och, NP  naproxen (NAPROSYN) 375 MG tablet Take 1 tablet (375 mg total) by mouth 2 (two) times daily. 09/24/14   Hope Orlene Och, NP   BP 106/58 mmHg  Pulse 79  Temp(Src) 98.5 F (36.9 C) (Oral)  Resp 18  Ht 4\' 11"  (1.499 m)  Wt 113 lb (51.256 kg)  BMI 22.81 kg/m2  SpO2 100%  LMP 09/24/2014 Physical Exam  Constitutional: She is oriented to person, place, and time. She appears well-developed and well-nourished.  HENT:  Head: Normocephalic and atraumatic.  Cardiovascular: Normal rate.   Pulmonary/Chest: Effort normal.  Musculoskeletal:       Right knee: She exhibits swelling. She exhibits normal range of motion, no laceration, no erythema and no LCL laxity. Tenderness found. MCL tenderness noted.  2+ pulses bilaterally Adequate circulation bilateral lower extremities Right knee:  Tender over medial collateral ligament Pain with flexion  and extension Mild swelling noted to right knee and pain on palpation of patella.  Neurological: She is alert and oriented to person, place, and time.  Skin: Skin is warm and dry.  Psychiatric: She has a normal mood and affect. Her behavior is normal.  Nursing note and vitals reviewed.   ED Course  Procedures (including critical care time)  DIAGNOSTIC STUDIES: Oxygen Saturation is 100% on room air, normal by my interpretation.    COORDINATION OF CARE: 3:10 PM Discussed treatment plan with pt at bedside and pt agreed to plan.   Labs Review Labs Reviewed - No data to display  Imaging Review Dg Knee Complete 4 Views Right  09/24/2014   CLINICAL DATA:  RIGHT knee pain since last night, twist injury and fall, pain medial and inferior to  patella  EXAM: RIGHT KNEE - COMPLETE 4+ VIEW  COMPARISON:  None  FINDINGS: Osseous mineralization normal.  Joint spaces preserved.  No fracture, dislocation, or bone destruction.  No knee joint effusion.  Mild lateral patellar tilt noted on sunrise view.  IMPRESSION: Mild lateral patellar tilt.  Otherwise negative exam.   Electronically Signed   By: Ulyses Southward M.D.   On: 09/24/2014 15:39   I, NEESE,HOPE, personally reviewed and evaluated these images results as part of my medical decision-making. I discussed this case with Dr. Karma Ganja and she reviewed the x-rays. Will place patient in knee immobilizer and have her follow up with ortho.    MDM  24 y.o. female with right knee pain after a twisting injury last pm. Stable for d/c without neurovascular compromise. Knee immobilizer applied, ice, elevation, crutches and follow up with ortho. Discussed with the patient and all questioned fully answered. She will follow up with ortho or return here if any problems arise.   Final diagnoses:  Knee injury, right, initial encounter   I personally performed the services described in this documentation, which was scribed in my presence. The recorded information has been reviewed and is accurate.    [place], NP 09/24/14 1609  Jerelyn Scott, MD 09/24/14 [phone]

## 2014-12-24 ENCOUNTER — Encounter (HOSPITAL_COMMUNITY): Payer: Self-pay | Admitting: Emergency Medicine

## 2014-12-24 ENCOUNTER — Emergency Department (HOSPITAL_COMMUNITY)
Admission: EM | Admit: 2014-12-24 | Discharge: 2014-12-24 | Disposition: A | Discharge status: Home / Self Care | Payer: Medicaid Other | Attending: Emergency Medicine | Admitting: Emergency Medicine

## 2014-12-24 DIAGNOSIS — Z8659 Personal history of other mental and behavioral disorders: Secondary | ICD-10-CM | POA: Insufficient documentation

## 2014-12-24 DIAGNOSIS — B349 Viral infection, unspecified: Secondary | ICD-10-CM

## 2014-12-24 DIAGNOSIS — J029 Acute pharyngitis, unspecified: Secondary | ICD-10-CM

## 2014-12-24 DIAGNOSIS — Z720 Tobacco use: Secondary | ICD-10-CM | POA: Insufficient documentation

## 2014-12-24 DIAGNOSIS — Z872 Personal history of diseases of the skin and subcutaneous tissue: Secondary | ICD-10-CM | POA: Insufficient documentation

## 2014-12-24 MED ORDER — IBUPROFEN 200 MG PO TABS
600.0000 mg | ORAL_TABLET | Freq: Once | ORAL | Status: AC
  Administered 2014-12-24: 600 mg via ORAL
  Filled 2014-12-24: qty 3

## 2014-12-24 MED ORDER — ACETAMINOPHEN 500 MG PO TABS
1000.0000 mg | ORAL_TABLET | Freq: Once | ORAL | Status: AC
  Administered 2014-12-24: 1000 mg via ORAL
  Filled 2014-12-24: qty 2

## 2014-12-24 NOTE — ED Provider Notes (Signed)
CSN: [id number]     Arrival date & time 12/24/14  0451 History   First MD Initiated Contact with Patient 12/24/14 0501     Chief Complaint  Patient presents with  . Generalized Body Aches     HPI Patient reports 24 hours of sore throat, cough, congestion, myalgias.  She denies shortness of breath.  She reports painful swallowing and sore throat.  She reports she has several children with upper respiratory symptoms at home.  Denies fevers and chills.  Denies abdominal pain.  No urinary symptoms.  No back pain or flank pain.  No new rash.  No altered mental status.  No other complaints.  Symptoms are mild to moderate in severity.  nothing worsens or improves.  She has not tried any medications prior to arrival   Past Medical History  Diagnosis Date  . Eczema   . Anxiety   . Depression    Past Surgical History  Procedure Laterality Date  . No past surgeries     Family History  Problem Relation Age of Onset  . Diabetes Mother   . Thyroid disease Sister    Social History  Substance Use Topics  . Smoking status: Current Every Day Smoker    Types: Cigarettes  . Smokeless tobacco: Never Used  . Alcohol Use: Yes   OB History    Gravida Para Term Preterm AB TAB SAB Ectopic Multiple Living   4 3 2 1 1  0 1 0 0 3     Review of Systems  All other systems reviewed and are negative.     Allergies  Clindamycin/lincomycin and Penicillins  Home Medications   Prior to Admission medications   Not on File   BP 120/78 mmHg  Pulse 81  Temp(Src) 99.5 F (37.5 C) (Oral)  Resp 16  Wt 113 lb (51.256 kg)  SpO2 100%  LMP 11/13/2014 (Within Weeks) Physical Exam  Constitutional: She is oriented to person, place, and time. She appears well-developed and well-nourished. No distress.  HENT:  Head: Normocephalic and atraumatic.  Uvula midline.  No trismus.  Posterior pharynx with mild erythema.  No tonsillar swelling or exudate.  Tolerating secretions.  Oral airway patent.  Eyes: EOM  are normal.  Neck: Normal range of motion.  Cardiovascular: Normal rate, regular rhythm and normal heart sounds.   Pulmonary/Chest: Effort normal and breath sounds normal.  Abdominal: Soft. She exhibits no distension. There is no tenderness.  Musculoskeletal: Normal range of motion.  Neurological: She is alert and oriented to person, place, and time.  Skin: Skin is warm and dry.  Psychiatric: She has a normal mood and affect. Judgment normal.  Nursing note and vitals reviewed.   ED Course  Procedures (including critical care time) Labs Review Labs Reviewed - No data to display  Imaging Review No results found. I have personally reviewed and evaluated these images and lab results as part of my medical decision-making.   EKG Interpretation None      MDM   Final diagnoses:  None    Overall the patient is well-appearing.  I suspect this is viral syndrome.  Abdominal exam is benign.  Ibuprofen and Tylenol.  Encouraged aggressive oral hydration at home.  No indication for chest x-ray and blood work.  Lungs are clear.  Pulse ox 100%    Azalia BilisKevin Motty Borin, MD 12/24/14 [phone]

## 2014-12-24 NOTE — ED Notes (Signed)
Unable to sign esignature pad due to not working

## 2014-12-24 NOTE — ED Notes (Signed)
Pt reports generalized body aches since yesterday, sore throat and cough. Muttering in triage. Reports intermittent productive cough with yellow sputum. Unable to report if she's had a fever, hasn't checked it. Reports allergies to penicillin, reports no medical problems. Alert and oriented x4, ambulatory with a steady gait.

## 2014-12-24 NOTE — Discharge Instructions (Signed)

## 2014-12-28 NOTE — ED Notes (Signed)
Spoke with Dr Donnald GarrePfeiffer. Ok to extend work note.

## 2015-02-03 ENCOUNTER — Emergency Department (INDEPENDENT_AMBULATORY_CARE_PROVIDER_SITE_OTHER)
Admission: EM | Admit: 2015-02-03 | Discharge: 2015-02-03 | Disposition: A | Discharge status: Home / Self Care | Payer: Self-pay | Source: Home / Self Care | Attending: Family Medicine | Admitting: Family Medicine

## 2015-02-03 ENCOUNTER — Encounter (HOSPITAL_COMMUNITY): Payer: Self-pay | Admitting: Emergency Medicine

## 2015-02-03 DIAGNOSIS — L729 Follicular cyst of the skin and subcutaneous tissue, unspecified: Secondary | ICD-10-CM

## 2015-02-03 DIAGNOSIS — L089 Local infection of the skin and subcutaneous tissue, unspecified: Secondary | ICD-10-CM

## 2015-02-03 MED ORDER — DOXYCYCLINE MONOHYDRATE 100 MG PO TABS: 100.0000 mg | ORAL_TABLET | Freq: Two times a day (BID) | ORAL | Status: DC

## 2015-02-03 MED ORDER — TRAMADOL HCL 50 MG PO TABS: 50.0000 mg | ORAL_TABLET | Freq: Four times a day (QID) | ORAL | Status: DC | PRN

## 2015-02-03 MED ORDER — LIDOCAINE-EPINEPHRINE (PF) 2 %-1:200000 IJ SOLN
INTRAMUSCULAR | Status: AC
  Filled 2015-02-03: qty 20

## 2015-02-03 NOTE — ED Notes (Signed)
Reports abscess to right, top of ear.  Noticed 2 days ago

## 2015-02-03 NOTE — Discharge Instructions (Signed)
Warm compresses frequently Change dressings twice a day Take antibiotic twice a day Take tramadol for pain as needed. Also may take ibuprofen for pain to help with inflammation. If worse, increase redness, spreading along the ear or face, fever go to the emergency department.

## 2015-02-03 NOTE — ED Provider Notes (Signed)
CSN: [id number]     Arrival date & time 02/03/15  1808 History   First MD Initiated Contact with Patient 02/03/15 1832     Chief Complaint  Patient presents with  . Abscess   (Consider location/radiation/quality/duration/timing/severity/associated sxs/prior Treatment) HPI Comments: 24 year old female complaining of pain to the right outer ear for 2-3 days. She  states that there has been a sore place growing in that area over that period of time. It is now red and tender. Denies having pain to the inside of the ear. Denies having problems with hearing.   Past Medical History  Diagnosis Date  . Eczema   . Anxiety   . Depression    Past Surgical History  Procedure Laterality Date  . No past surgeries     Family History  Problem Relation Age of Onset  . Diabetes Mother   . Thyroid disease Sister    Social History  Substance Use Topics  . Smoking status: Current Every Day Smoker    Types: Cigarettes  . Smokeless tobacco: Never Used  . Alcohol Use: Yes   OB History    Gravida Para Term Preterm AB TAB SAB Ectopic Multiple Living   4 3 2 1 1  0 1 0 0 3     Review of Systems  Constitutional: Negative.   HENT: Positive for ear pain. Negative for congestion, ear discharge, postnasal drip and rhinorrhea.   Eyes: Negative.   Respiratory: Negative.   All other systems reviewed and are negative.   Allergies  Clindamycin/lincomycin and Penicillins  Home Medications   Prior to Admission medications   Medication Sig Start Date End Date Taking? Authorizing Provider  doxycycline (ADOXA) 100 MG tablet Take 1 tablet (100 mg total) by mouth 2 (two) times daily. 02/03/15   Hayden Rasmussenavid Marae Cottrell, NP  traMADol (ULTRAM) 50 MG tablet Take 1 tablet (50 mg total) by mouth every 6 (six) hours as needed. 02/03/15   Hayden Rasmussenavid Breannah Kratt, NP   Meds Ordered and Administered this Visit  Medications - No data to display  BP 127/82 mmHg  Pulse 100  Temp(Src) 98.4 F (36.9 C) (Oral)  SpO2 100%  LMP  01/19/2015 No data found.   Physical Exam  Constitutional: She appears well-developed and well-nourished. No distress.  HENT:  Head: Normocephalic and atraumatic.  Left Ear: External ear normal.  Ears:  Right TM and EAC is normal. There is a mounded, raised, red and tender nodular lesion just anterior to the tragus of the right ear. This does not appear to involve the cartilage of the ear.  Eyes: Conjunctivae and EOM are normal.  Neck: Normal range of motion. Neck supple.  Pulmonary/Chest: Effort normal.  Lymphadenopathy:    She has no cervical adenopathy.  Neurological: She is alert. She exhibits normal muscle tone.  Skin: Skin is warm.  Nursing note and vitals reviewed.   ED Course  .Marland Kitchen.Incision and Drainage Date/Time: 02/03/2015 7:17 PM Performed by: Phineas RealMABE, Kashmere Staffa Authorized by: Bradd CanaryKINDL, JAMES D Consent: Verbal consent obtained. Risks and benefits: risks, benefits and alternatives were discussed Consent given by: patient Patient understanding: patient states understanding of the procedure being performed Patient identity confirmed: verbally with patient Type: cyst Body area: head Location details: right external ear Anesthesia: local infiltration Local anesthetic: lidocaine 2% with epinephrine Anesthetic total: 2 ml Patient sedated: no Scalpel size: 11 Incision type: single straight Incision depth: dermal Complexity: simple Drainage: purulent and  bloody Drainage amount: scant Wound treatment: wound left open Packing material: none Patient tolerance: Patient  tolerated the procedure well with no immediate complications Comments: Once the puncture/incision was made with a #11 blade a small amount of purulent material was expressed. The lesion is a solid /cystic-like lesion that apparently has become infected.   (including critical care time)  Labs Review Labs Reviewed - No data to display  Imaging Review No results found.   Visual Acuity Review  Right Eye  Distance:   Left Eye Distance:   Bilateral Distance:    Right Eye Near:   Left Eye Near:    Bilateral Near:         MDM   1. Infected cyst of skin    Warm compresses frequently Change dressings twice a day Take antibiotic twice a day Take tramadol for pain as needed. Also may take ibuprofen for pain to help with inflammation. If worse, increase redness, spreading along the ear or face, fever go to the emergency department.     Hayden Rasmussen, NP 02/03/15 1925  Hayden Rasmussen, NP 02/03/15 [phone]

## 2015-04-02 ENCOUNTER — Emergency Department (HOSPITAL_COMMUNITY)
Admission: EM | Admit: 2015-04-02 | Discharge: 2015-04-02 | Disposition: A | Discharge status: Home / Self Care | Payer: Self-pay | Attending: Emergency Medicine | Admitting: Emergency Medicine

## 2015-04-02 ENCOUNTER — Encounter (HOSPITAL_COMMUNITY): Payer: Self-pay | Admitting: *Deleted

## 2015-04-02 DIAGNOSIS — Y998 Other external cause status: Secondary | ICD-10-CM | POA: Insufficient documentation

## 2015-04-02 DIAGNOSIS — Z872 Personal history of diseases of the skin and subcutaneous tissue: Secondary | ICD-10-CM | POA: Insufficient documentation

## 2015-04-02 DIAGNOSIS — Y92410 Unspecified street and highway as the place of occurrence of the external cause: Secondary | ICD-10-CM | POA: Insufficient documentation

## 2015-04-02 DIAGNOSIS — S0990XA Unspecified injury of head, initial encounter: Secondary | ICD-10-CM | POA: Insufficient documentation

## 2015-04-02 DIAGNOSIS — F17210 Nicotine dependence, cigarettes, uncomplicated: Secondary | ICD-10-CM | POA: Insufficient documentation

## 2015-04-02 DIAGNOSIS — Z792 Long term (current) use of antibiotics: Secondary | ICD-10-CM | POA: Insufficient documentation

## 2015-04-02 DIAGNOSIS — Z880 Allergy status to penicillin: Secondary | ICD-10-CM | POA: Insufficient documentation

## 2015-04-02 DIAGNOSIS — F419 Anxiety disorder, unspecified: Secondary | ICD-10-CM | POA: Insufficient documentation

## 2015-04-02 DIAGNOSIS — F329 Major depressive disorder, single episode, unspecified: Secondary | ICD-10-CM | POA: Insufficient documentation

## 2015-04-02 DIAGNOSIS — Y9389 Activity, other specified: Secondary | ICD-10-CM | POA: Insufficient documentation

## 2015-04-02 MED ORDER — ACETAMINOPHEN 325 MG PO TABS
650.0000 mg | ORAL_TABLET | Freq: Once | ORAL | Status: AC
  Administered 2015-04-02: 650 mg via ORAL
  Filled 2015-04-02: qty 2

## 2015-04-02 NOTE — Discharge Instructions (Signed)
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
## 2015-04-06 ENCOUNTER — Emergency Department (HOSPITAL_COMMUNITY)
Admission: EM | Admit: 2015-04-06 | Discharge: 2015-04-06 | Disposition: A | Discharge status: Home / Self Care | Payer: No Typology Code available for payment source | Attending: Emergency Medicine | Admitting: Emergency Medicine

## 2015-04-06 ENCOUNTER — Encounter (HOSPITAL_COMMUNITY): Payer: Self-pay | Admitting: Emergency Medicine

## 2015-04-06 DIAGNOSIS — Y998 Other external cause status: Secondary | ICD-10-CM | POA: Insufficient documentation

## 2015-04-06 DIAGNOSIS — F17210 Nicotine dependence, cigarettes, uncomplicated: Secondary | ICD-10-CM | POA: Insufficient documentation

## 2015-04-06 DIAGNOSIS — M545 Low back pain, unspecified: Secondary | ICD-10-CM

## 2015-04-06 DIAGNOSIS — F329 Major depressive disorder, single episode, unspecified: Secondary | ICD-10-CM | POA: Insufficient documentation

## 2015-04-06 DIAGNOSIS — Z872 Personal history of diseases of the skin and subcutaneous tissue: Secondary | ICD-10-CM | POA: Insufficient documentation

## 2015-04-06 DIAGNOSIS — Y92410 Unspecified street and highway as the place of occurrence of the external cause: Secondary | ICD-10-CM | POA: Insufficient documentation

## 2015-04-06 DIAGNOSIS — S3992XA Unspecified injury of lower back, initial encounter: Secondary | ICD-10-CM | POA: Insufficient documentation

## 2015-04-06 DIAGNOSIS — F419 Anxiety disorder, unspecified: Secondary | ICD-10-CM | POA: Insufficient documentation

## 2015-04-06 DIAGNOSIS — Z792 Long term (current) use of antibiotics: Secondary | ICD-10-CM | POA: Insufficient documentation

## 2015-04-06 DIAGNOSIS — Y9389 Activity, other specified: Secondary | ICD-10-CM | POA: Insufficient documentation

## 2015-04-06 DIAGNOSIS — Z880 Allergy status to penicillin: Secondary | ICD-10-CM | POA: Insufficient documentation

## 2015-04-06 DIAGNOSIS — S0990XA Unspecified injury of head, initial encounter: Secondary | ICD-10-CM | POA: Insufficient documentation

## 2015-04-06 DIAGNOSIS — S8001XA Contusion of right knee, initial encounter: Secondary | ICD-10-CM | POA: Insufficient documentation

## 2015-04-06 MED ORDER — CYCLOBENZAPRINE HCL 10 MG PO TABS
10.0000 mg | ORAL_TABLET | Freq: Once | ORAL | Status: AC
  Administered 2015-04-06: 10 mg via ORAL
  Filled 2015-04-06: qty 1

## 2015-04-06 MED ORDER — IBUPROFEN 600 MG PO TABS: 600.0000 mg | ORAL_TABLET | Freq: Three times a day (TID) | ORAL | Status: DC | PRN

## 2015-04-06 MED ORDER — CYCLOBENZAPRINE HCL 10 MG PO TABS: 10.0000 mg | ORAL_TABLET | Freq: Two times a day (BID) | ORAL | Status: DC | PRN

## 2015-04-06 MED ORDER — IBUPROFEN 400 MG PO TABS
800.0000 mg | ORAL_TABLET | Freq: Once | ORAL | Status: AC
  Administered 2015-04-06: 800 mg via ORAL
  Filled 2015-04-06: qty 2
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
## 2015-04-06 NOTE — ED Notes (Signed)
Pt placed into gown 
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
## 2015-04-18 ENCOUNTER — Emergency Department (HOSPITAL_COMMUNITY)
Admission: EM | Admit: 2015-04-18 | Discharge: 2015-04-18 | Disposition: A | Discharge status: Home / Self Care | Payer: No Typology Code available for payment source | Attending: Emergency Medicine | Admitting: Emergency Medicine

## 2015-04-18 ENCOUNTER — Encounter (HOSPITAL_COMMUNITY): Payer: Self-pay

## 2015-04-18 ENCOUNTER — Emergency Department (HOSPITAL_COMMUNITY): Payer: No Typology Code available for payment source

## 2015-04-18 DIAGNOSIS — Y92410 Unspecified street and highway as the place of occurrence of the external cause: Secondary | ICD-10-CM | POA: Insufficient documentation

## 2015-04-18 DIAGNOSIS — Z880 Allergy status to penicillin: Secondary | ICD-10-CM | POA: Insufficient documentation

## 2015-04-18 DIAGNOSIS — M545 Low back pain, unspecified: Secondary | ICD-10-CM

## 2015-04-18 DIAGNOSIS — Z872 Personal history of diseases of the skin and subcutaneous tissue: Secondary | ICD-10-CM | POA: Insufficient documentation

## 2015-04-18 DIAGNOSIS — F329 Major depressive disorder, single episode, unspecified: Secondary | ICD-10-CM | POA: Insufficient documentation

## 2015-04-18 DIAGNOSIS — S0990XA Unspecified injury of head, initial encounter: Secondary | ICD-10-CM | POA: Insufficient documentation

## 2015-04-18 DIAGNOSIS — F419 Anxiety disorder, unspecified: Secondary | ICD-10-CM | POA: Insufficient documentation

## 2015-04-18 DIAGNOSIS — Y9389 Activity, other specified: Secondary | ICD-10-CM | POA: Insufficient documentation

## 2015-04-18 DIAGNOSIS — S3992XA Unspecified injury of lower back, initial encounter: Secondary | ICD-10-CM | POA: Insufficient documentation

## 2015-04-18 DIAGNOSIS — Z792 Long term (current) use of antibiotics: Secondary | ICD-10-CM | POA: Insufficient documentation

## 2015-04-18 DIAGNOSIS — R519 Headache, unspecified: Secondary | ICD-10-CM

## 2015-04-18 DIAGNOSIS — Z3202 Encounter for pregnancy test, result negative: Secondary | ICD-10-CM | POA: Insufficient documentation

## 2015-04-18 DIAGNOSIS — Y998 Other external cause status: Secondary | ICD-10-CM | POA: Insufficient documentation

## 2015-04-18 DIAGNOSIS — F17210 Nicotine dependence, cigarettes, uncomplicated: Secondary | ICD-10-CM | POA: Insufficient documentation

## 2015-04-18 DIAGNOSIS — R51 Headache: Secondary | ICD-10-CM

## 2015-04-18 LAB — POC URINE PREG, ED: Preg Test, Ur: NEGATIVE
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
## 2015-04-18 NOTE — ED Notes (Signed)
Patient transported to X-ray 

## 2015-04-18 NOTE — ED Notes (Signed)
poc urine is NEG

## 2015-04-18 NOTE — ED Notes (Signed)
Pt left w/o AVS.

## 2015-04-18 NOTE — Discharge Instructions (Signed)
Continue to take ibuprofen and tylenol for pain. Try heating pads. Do not lift anything heavier than 5 lb until pain improves. Follow up with primary care doctor. Call medicaid to see if you still have it.   Lumbosacral Strain Lumbosacral strain is a strain of any of the parts that make up your lumbosacral vertebrae. Your lumbosacral vertebrae are the bones that make up the lower third of your backbone. Your lumbosacral vertebrae are held together by muscles and tough, fibrous tissue (ligaments).  CAUSES  A sudden blow to your back can cause lumbosacral strain. Also, anything that causes an excessive stretch of the muscles in the low back can cause this strain. This is typically seen when people exert themselves strenuously, fall, lift heavy objects, bend, or crouch repeatedly. RISK FACTORS  Physically demanding work.  Participation in pushing or pulling sports or sports that require a sudden twist of the back (tennis, golf, baseball).  Weight lifting.  Excessive lower back curvature.  Forward-tilted pelvis.  Weak back or abdominal muscles or both.  Tight hamstrings. SIGNS AND SYMPTOMS  Lumbosacral strain may cause pain in the area of your injury or pain that moves (radiates) down your leg.  DIAGNOSIS Your health care provider can often diagnose lumbosacral strain through a physical exam. In some cases, you may need tests such as X-ray exams.  TREATMENT  Treatment for your lower back injury depends on many factors that your clinician will have to evaluate. However, most treatment will include the use of anti-inflammatory medicines. HOME CARE INSTRUCTIONS   Avoid hard physical activities (tennis, racquetball, waterskiing) if you are not in proper physical condition for it. This may aggravate or create problems.  If you have a back problem, avoid sports requiring sudden body movements. Swimming and walking are generally safer activities.  Maintain good posture.  Maintain a healthy  weight.  For acute conditions, you may put ice on the injured area.  Put ice in a plastic bag.  Place a towel between your skin and the bag.  Leave the ice on for 20 minutes, 2-3 times a day.  When the low back starts healing, stretching and strengthening exercises may be recommended. SEEK MEDICAL CARE IF:  Your back pain is getting worse.  You experience severe back pain not relieved with medicines. SEEK IMMEDIATE MEDICAL CARE IF:   You have numbness, tingling, weakness, or problems with the use of your arms or legs.  There is a change in bowel or bladder control.  You have increasing pain in any area of the body, including your belly (abdomen).  You notice shortness of breath, dizziness, or feel faint.  You feel sick to your stomach (nauseous), are throwing up (vomiting), or become sweaty.  You notice discoloration of your toes or legs, or your feet get very cold. MAKE SURE YOU:   Understand these instructions.  Will watch your condition.  Will get help right away if you are not doing well or get worse.   This information is not intended to replace advice given to you by your health care provider. Make sure you discuss any questions you have with your health care provider.   Document Released: 11/08/2004 Document Revised: 02/19/2014 Document Reviewed: 09/17/2012 Elsevier Interactive Patient Education [employer]2016 Elsevier Inc.

## 2015-07-02 ENCOUNTER — Emergency Department (HOSPITAL_COMMUNITY)
Admission: EM | Admit: 2015-07-02 | Discharge: 2015-07-02 | Disposition: A | Discharge status: Home / Self Care | Payer: No Typology Code available for payment source | Attending: Emergency Medicine | Admitting: Emergency Medicine

## 2015-07-02 ENCOUNTER — Encounter (HOSPITAL_COMMUNITY): Payer: Self-pay

## 2015-07-02 DIAGNOSIS — R519 Headache, unspecified: Secondary | ICD-10-CM

## 2015-07-02 DIAGNOSIS — Z880 Allergy status to penicillin: Secondary | ICD-10-CM | POA: Insufficient documentation

## 2015-07-02 DIAGNOSIS — Z792 Long term (current) use of antibiotics: Secondary | ICD-10-CM | POA: Insufficient documentation

## 2015-07-02 DIAGNOSIS — F17210 Nicotine dependence, cigarettes, uncomplicated: Secondary | ICD-10-CM | POA: Insufficient documentation

## 2015-07-02 DIAGNOSIS — R51 Headache: Secondary | ICD-10-CM | POA: Insufficient documentation

## 2015-07-02 DIAGNOSIS — F329 Major depressive disorder, single episode, unspecified: Secondary | ICD-10-CM | POA: Insufficient documentation

## 2015-07-02 DIAGNOSIS — Z872 Personal history of diseases of the skin and subcutaneous tissue: Secondary | ICD-10-CM | POA: Insufficient documentation

## 2015-07-02 DIAGNOSIS — F419 Anxiety disorder, unspecified: Secondary | ICD-10-CM | POA: Insufficient documentation

## 2015-07-02 MED ORDER — METOCLOPRAMIDE HCL 5 MG/ML IJ SOLN
10.0000 mg | Freq: Once | INTRAMUSCULAR | Status: DC
  Filled 2015-07-02: qty 2

## 2015-07-02 MED ORDER — IBUPROFEN 200 MG PO TABS
600.0000 mg | ORAL_TABLET | Freq: Once | ORAL | Status: AC
  Administered 2015-07-02: 600 mg via ORAL
  Filled 2015-07-02: qty 3

## 2015-07-02 MED ORDER — SODIUM CHLORIDE 0.9 % IV BOLUS (SEPSIS)
1000.0000 mL | Freq: Once | INTRAVENOUS | Status: AC
  Administered 2015-07-02: 1000 mL via INTRAVENOUS

## 2015-07-02 MED ORDER — DIPHENHYDRAMINE HCL 50 MG/ML IJ SOLN
25.0000 mg | Freq: Once | INTRAMUSCULAR | Status: DC
  Filled 2015-07-02: qty 1

## 2015-07-02 NOTE — Discharge Instructions (Signed)
Take Tylenol or Motrin for your headache. Find a primary care provider and follow up with them in 2 days regarding your visit to the emergency department today.  Return to the emergency department if you experience worsening headache, visual changes, dizziness, you pass out, chest pain, shortness of breath, nausea, vomiting.  General Headache Without Cause A headache is pain or discomfort felt around the head or neck area. The specific cause of a headache may not be found. There are many causes and types of headaches. A few common ones are:  Tension headaches.  Migraine headaches.  Cluster headaches.  Chronic daily headaches. HOME CARE INSTRUCTIONS  Watch your condition for any changes. Take these steps to help with your condition: Managing Pain  Take over-the-counter and prescription medicines only as told by your health care provider.  Lie down in a dark, quiet room when you have a headache.  If directed, apply ice to the head and neck area:  Put ice in a plastic bag.  Place a towel between your skin and the bag.  Leave the ice on for 20 minutes, 2-3 times per day.  Use a heating pad or hot shower to apply heat to the head and neck area as told by your health care provider.  Keep lights dim if bright lights bother you or make your headaches worse. Eating and Drinking  Eat meals on a regular schedule.  Limit alcohol use.  Decrease the amount of caffeine you drink, or stop drinking caffeine. General Instructions  Keep all follow-up visits as told by your health care provider. This is important.  Keep a headache journal to help find out what may trigger your headaches. For example, write down:  What you eat and drink.  How much sleep you get.  Any change to your diet or medicines.  Try massage or other relaxation techniques.  Limit stress.  Sit up straight, and do not tense your muscles.  Do not use tobacco products, including cigarettes, chewing tobacco, or  e-cigarettes. If you need help quitting, ask your health care provider.  Exercise regularly as told by your health care provider.  Sleep on a regular schedule. Get 7-9 hours of sleep, or the amount recommended by your health care provider. SEEK MEDICAL CARE IF:   Your symptoms are not helped by medicine.  You have a headache that is different from the usual headache.  You have nausea or you vomit.  You have a fever. SEEK IMMEDIATE MEDICAL CARE IF:   Your headache becomes severe.  You have repeated vomiting.  You have a stiff neck.  You have a loss of vision.  You have problems with speech.  You have pain in the eye or ear.  You have muscular weakness or loss of muscle control.  You lose your balance or have trouble walking.  You feel faint or pass out.  You have confusion.   This information is not intended to replace advice given to you by your health care provider. Make sure you discuss any questions you have with your health care provider.   Document Released: 01/29/2005 Document Revised: 10/20/2014 Document Reviewed: 05/24/2014 Elsevier Interactive Patient Education [employer]2016 Elsevier Inc.

## 2015-07-02 NOTE — ED Notes (Signed)
Pt is in stable condition upon d/c and ambulates from ED. 

## 2015-07-02 NOTE — ED Notes (Signed)
Patient here with throbbing headache x 3 hours. Reports that it started after stressful event. Alert and oriented
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
## 2015-08-29 ENCOUNTER — Telehealth: Payer: Self-pay | Admitting: Cardiology

## 2015-08-29 ENCOUNTER — Emergency Department
Admission: EM | Admit: 2015-08-29 | Discharge: 2015-08-29 | Disposition: A | Discharge status: Home / Self Care | Payer: MEDICAID | Attending: Emergency Medicine | Admitting: Emergency Medicine

## 2015-08-29 ENCOUNTER — Encounter: Payer: Self-pay | Admitting: *Deleted

## 2015-08-29 DIAGNOSIS — Z791 Long term (current) use of non-steroidal anti-inflammatories (NSAID): Secondary | ICD-10-CM | POA: Insufficient documentation

## 2015-08-29 DIAGNOSIS — F17210 Nicotine dependence, cigarettes, uncomplicated: Secondary | ICD-10-CM | POA: Insufficient documentation

## 2015-08-29 DIAGNOSIS — R55 Syncope and collapse: Secondary | ICD-10-CM | POA: Insufficient documentation

## 2015-08-29 DIAGNOSIS — J45909 Unspecified asthma, uncomplicated: Secondary | ICD-10-CM | POA: Insufficient documentation

## 2015-08-29 DIAGNOSIS — F329 Major depressive disorder, single episode, unspecified: Secondary | ICD-10-CM | POA: Insufficient documentation

## 2015-08-29 HISTORY — DX: Unspecified asthma, uncomplicated: J45.909

## 2015-08-29 LAB — COMPREHENSIVE METABOLIC PANEL
ALK PHOS: 43 U/L (ref 38–126)
ALT: 9 U/L — ABNORMAL LOW (ref 14–54)
ANION GAP: 5 (ref 5–15)
AST: 18 U/L (ref 15–41)
Albumin: 4 g/dL (ref 3.5–5.0)
BILIRUBIN TOTAL: 0.3 mg/dL (ref 0.3–1.2)
BUN: 14 mg/dL (ref 6–20)
CALCIUM: 9.1 mg/dL (ref 8.9–10.3)
CO2: 26 mmol/L (ref 22–32)
CREATININE: 0.76 mg/dL (ref 0.44–1.00)
Chloride: 105 mmol/L (ref 101–111)
GFR calc non Af Amer: >60 mL/min (ref >60)
Glucose, Bld: 91 mg/dL (ref 65–99)
Potassium: 3.7 mmol/L (ref 3.5–5.1)
Sodium: 136 mmol/L (ref 135–145)
TOTAL PROTEIN: 8.7 g/dL — AB (ref 6.5–8.1)

## 2015-08-29 LAB — CBC
HEMATOCRIT: 38.7 % (ref 35.0–47.0)
HEMOGLOBIN: 13.3 g/dL (ref 12.0–16.0)
MCH: 31.6 pg (ref 26.0–34.0)
MCHC: 34.4 g/dL (ref 32.0–36.0)
MCV: 91.8 fL (ref 80.0–100.0)
Platelets: 258 10*3/uL (ref 150–440)
RBC: 4.21 MIL/uL (ref 3.80–5.20)
RDW: 14.9 % — ABNORMAL HIGH (ref 11.5–14.5)
WBC: 5.5 10*3/uL (ref 3.6–11.0)

## 2015-08-29 LAB — GLUCOSE, CAPILLARY: GLUCOSE-CAPILLARY: 69 mg/dL (ref 65–99)

## 2015-08-29 LAB — POCT PREGNANCY, URINE: Preg Test, Ur: NEGATIVE

## 2015-08-29 LAB — TROPONIN I

## 2015-08-29 NOTE — Discharge Instructions (Signed)

## 2015-08-29 NOTE — ED Notes (Signed)
States a syncopal episode at work this AM, denies hitting her head, states she was weak and diaphoretic, states she felt normal this AM when she got up, states her period ended yesterday

## 2015-08-29 NOTE — ED Provider Notes (Signed)
[facility] Emergency Department Provider Note   ____________________________________________    I have reviewed the triage vital signs and the nursing notes.   HISTORY  Chief Complaint Loss of Consciousness     HPI Lindsey Velazquez is a 25 y.o. female who presents after a single episode. Patient reports she was at work, standing, felt lightheaded. She sat down and continued to feel hot and lightheaded. Coworkers attempted to bring her to the break room and she apparently syncopized, she was caught by her coworkers. No palpitations, no chest pain. No recent travel, no calf pain or swelling.   Past Medical History  Diagnosis Date  . Eczema   . Anxiety   . Depression   . Asthma     Patient Active Problem List   Diagnosis Date Noted  . Neutropenia (HCC) 12/09/2013  . Contraception management 02/16/2013  . Rubella non-immune status, antepartum 09/27/2011    Past Surgical History  Procedure Laterality Date  . No past surgeries      Current Outpatient Rx  Name  Route  Sig  Dispense  Refill  . Ibuprofen-Diphenhydramine HCl (ADVIL PM) 200-25 MG CAPS   Oral   Take 1 tablet by mouth every 6 (six) hours as needed.           Allergies Clindamycin/lincomycin and Penicillins  Family History  Problem Relation Age of Onset  . Diabetes Mother   . Thyroid disease Sister     Social History Social History  Substance Use Topics  . Smoking status: Current Every Day Smoker    Types: Cigarettes  . Smokeless tobacco: Never Used  . Alcohol Use: Yes    Review of Systems  Constitutional: No fever/chills Eyes: No visual changes.  ENT: No Neck pain Cardiovascular: Denies chest pain. Respiratory: Denies shortness of breath. Gastrointestinal: No abdominal pain.   Genitourinary: Patient just finished her period Musculoskeletal: Negative for back pain. Skin: Negative for rash. Neurological: Negative for headaches  10-point ROS otherwise  negative.  ____________________________________________   PHYSICAL EXAM:  VITAL SIGNS: ED Triage Vitals  Enc Vitals Group     BP 08/29/15 0842 93/64 mmHg     Pulse Rate 08/29/15 0842 65     Resp 08/29/15 0842 18     Temp 08/29/15 0842 98.2 F (36.8 C)     Temp Source 08/29/15 0842 Oral     SpO2 08/29/15 0842 100 %     Weight 08/29/15 0842 122 lb (55.339 kg)     Height 08/29/15 0842 4\' 10"  (1.473 m)     Head Cir --      Peak Flow --      Pain Score --      Pain Loc --      Pain Edu? --      Excl. in GC? --    Constitutional: Alert and oriented. No acute distress. Pleasant and interactive Eyes: Conjunctivae are normal.  Head: Atraumatic.Normocephalic Nose: No congestion/rhinnorhea. Mouth/Throat: Mucous membranes are moist.  Oropharynx non-erythematous. Neck: Painless ROM Cardiovascular: Normal rate, regular rhythm. Grossly normal heart sounds.  Good peripheral circulation. Respiratory: Normal respiratory effort.  No retractions. Lungs CTAB. Gastrointestinal: Soft and nontender. No distention.  No CVA tenderness. Genitourinary: deferred Musculoskeletal: No lower extremity tenderness nor edema.  Warm and well perfused Neurologic:  Normal speech and language. No gross focal neurologic deficits are appreciated.  Skin:  Skin is warm, dry and intact. No rash noted. Psychiatric: Mood and affect are normal. Speech and behavior are  normal.  ____________________________________________   LABS (all labs ordered are listed, but only abnormal results are displayed)  Labs Reviewed  CBC - Abnormal; Notable for the following:    RDW 14.9 (*)    All other components within normal limits  COMPREHENSIVE METABOLIC PANEL - Abnormal; Notable for the following:    Total Protein 8.7 (*)    ALT 9 (*)    All other components within normal limits  TROPONIN I  GLUCOSE, CAPILLARY  POCT PREGNANCY, URINE   ____________________________________________  EKG  ED ECG REPORT I, Jene Every, the attending physician, personally viewed and interpreted this ECG.  Date: 08/29/2015 EKG Time: 8:44 AM Rate: 56 Rhythm: Sinus bradycardia QRS Axis: normal Intervals: normal ST/T Wave abnormalities: normal Conduction Disturbances: none Narrative Interpretation: unremarkable  ____________________________________________  RADIOLOGY  None ____________________________________________   PROCEDURES  Procedure(s) performed: No    Critical Care performed: No ____________________________________________   INITIAL IMPRESSION / ASSESSMENT AND PLAN / ED COURSE  Pertinent labs & imaging results that were available during my care of the patient were reviewed by me and considered in my medical decision making (see chart for details).  Patient presents after syncopal incident work. Her description is consistent with vasovagal syncope. We will check labs, EKG and reevaluate  Orthostatics normal. Patient feels very well. No dizziness. She will follow-up with her PCP, return precautions discussed ____________________________________________   FINAL CLINICAL IMPRESSION(S) / ED DIAGNOSES  Final diagnoses:  Syncope and collapse      NEW MEDICATIONS STARTED DURING THIS VISIT:  Discharge Medication List as of 08/29/2015 11:03 AM       Note:  This document was prepared using Dragon voice recognition software and may include unintentional dictation errors.    Jene Every, MD 08/29/15 1320

## 2015-08-29 NOTE — Telephone Encounter (Signed)
Called patient to make appointment for ED fu  Pt is coming 09/06/15 to see Dr Alvino ChapelIngal

## 2015-08-29 NOTE — ED Notes (Signed)
Pt to ED today after having syncopal episode at work.  Pt states she had just arrived at work, when she became light-headed with hot sensation.  Pt states someone at her side was able to catch her when she passed out and they lowered her to the ground.  Pt with no pain now. BG 69 on arrival to ED. Pt alert and oriented and in no apparent distress.

## 2015-09-06 ENCOUNTER — Encounter: Payer: Self-pay | Admitting: Cardiovascular Disease

## 2015-09-06 ENCOUNTER — Ambulatory Visit (INDEPENDENT_AMBULATORY_CARE_PROVIDER_SITE_OTHER): Payer: Self-pay | Admitting: Cardiovascular Disease

## 2015-09-06 VITALS — BP 100/60 | HR 56 | Ht 60.0 in | Wt 116.0 lb

## 2015-09-06 DIAGNOSIS — Z7689 Persons encountering health services in other specified circumstances: Secondary | ICD-10-CM

## 2015-09-06 DIAGNOSIS — R55 Syncope and collapse: Secondary | ICD-10-CM

## 2015-09-06 DIAGNOSIS — Z7189 Other specified counseling: Secondary | ICD-10-CM

## 2015-09-06 NOTE — Patient Instructions (Signed)
Medication Instructions:  Your physician recommends that you continue on your current medications as directed. Please refer to the Current Medication list given to you today.  Labwork: None ordered.  Testing/Procedures: None ordered.  Follow-Up: Your physician recommends that you schedule a follow-up appointment as needed.   Any Other Special Instructions Will Be Listed Below (If Applicable).     If you need a refill on your cardiac medications before your next appointment, please call your pharmacy.   

## 2015-09-06 NOTE — Progress Notes (Signed)
Cardiology Office Note   Date:  09/06/2015   ID:  Lindsey Velazquez, DOB 08-05-1990, MRN [id number]  PCP:  No PCP Per Patient  Cardiologist:   Lorine Bears, MD   Chief Complaint  Patient presents with  . Establish Care    pt went to ED due to passing out, pt says that "chest feels strange and tight"      History of Present Illness: Lindsey Velazquez is a 25 y.o. female who was referred from the emergency room at [facility] for evaluation of syncope. She has no previous cardiac history and no significant chronic medical conditions other than tobacco use. She smokes 5-6 cigarettes a day. She has no family history of coronary artery disease, arrhythmia or sudden death. She was at work recently and felt very hot and dizzy. They tried to take her inside but she had loss of consciousness with no reported seizure activities or incontinence. She reports having a syncopal episode about one year ago after she came out of the shower. She has occasional chest discomfort when she is stressed. No exertional symptoms of chest pain or dyspnea. No palpitations. Labs in the emergency room were unremarkable including negative troponin and pregnancy test.    Past Medical History:  Diagnosis Date  . Anxiety   . Asthma   . Depression   . Eczema     Past Surgical History:  Procedure Laterality Date  . NO PAST SURGERIES       Current Outpatient Prescriptions  Medication Sig Dispense Refill  . Ibuprofen-Diphenhydramine HCl (ADVIL PM) 200-25 MG CAPS Take 1 tablet by mouth every 6 (six) hours as needed.     No current facility-administered medications for this visit.     Allergies:   Clindamycin/lincomycin and Penicillins    Social History:  The patient  reports that she has been smoking Cigarettes.  She has never used smokeless tobacco. She reports that she drinks alcohol. She reports that she does not use drugs.   Family History:  The patient's family history includes Diabetes in her mother;  Thyroid disease in her sister.    ROS:  Please see the history of present illness.   Otherwise, review of systems are positive for none.   All other systems are reviewed and negative.    PHYSICAL EXAM: VS:  BP 100/60 (BP Location: Left Arm, Patient Position: Sitting, Cuff Size: Normal)   Pulse (!) 56   Ht 5' (1.524 m)   Wt 116 lb (52.6 kg)   SpO2 99%   BMI 22.65 kg/m  , BMI Body mass index is 22.65 kg/m. GEN: Well nourished, well developed, in no acute distress  HEENT: normal  Neck: no JVD, carotid bruits, or masses Cardiac: RRR; no murmurs, rubs, or gallops,no edema  Respiratory:  clear to auscultation bilaterally, normal work of breathing GI: soft, nontender, nondistended, + BS MS: no deformity or atrophy  Skin: warm and dry, no rash Neuro:  Strength and sensation are intact Psych: euthymic mood, full affect   EKG:  EKG is not ordered today. Recent EKG was reviewed and showed normal sinus rhythm with sinus arrhythmia   Recent Labs: 08/29/2015: ALT 9; BUN 14; Creatinine, Ser 0.76; Hemoglobin 13.3; Platelets 258; Potassium 3.7; Sodium 136    Lipid Panel No results found for: CHOL, TRIG, HDL, CHOLHDL, VLDL, LDLCALC, LDLDIRECT    Wt Readings from Last 3 Encounters:  09/06/15 116 lb (52.6 kg)  08/29/15 122 lb (55.3 kg)  12/24/14 113 lb (51.3 kg)  ASSESSMENT AND PLAN:  1.  Vasovagal syncope: Based on the description, the episodes seems to have been vasovagal in nature. Her cardiac exam is normal and baseline ECG is unremarkable. I do not recommend any further workup at the present time. I advised her to stay well-hydrated.  2. Tobacco use: Advised to quit smoking.    Disposition:   FU with me as needed.   Signed,  Lorine Bears, MD  09/06/2015 5:38 PM    [facility]

## 2015-10-23 ENCOUNTER — Emergency Department (HOSPITAL_COMMUNITY)
Admission: EM | Admit: 2015-10-23 | Discharge: 2015-10-23 | Disposition: A | Discharge status: Home / Self Care | Payer: Self-pay | Attending: Emergency Medicine | Admitting: Emergency Medicine

## 2015-10-23 ENCOUNTER — Encounter (HOSPITAL_COMMUNITY): Payer: Self-pay | Admitting: *Deleted

## 2015-10-23 DIAGNOSIS — F17210 Nicotine dependence, cigarettes, uncomplicated: Secondary | ICD-10-CM | POA: Insufficient documentation

## 2015-10-23 DIAGNOSIS — J45909 Unspecified asthma, uncomplicated: Secondary | ICD-10-CM | POA: Insufficient documentation

## 2015-10-23 DIAGNOSIS — Z3201 Encounter for pregnancy test, result positive: Secondary | ICD-10-CM | POA: Insufficient documentation

## 2015-10-23 DIAGNOSIS — K1379 Other lesions of oral mucosa: Secondary | ICD-10-CM | POA: Insufficient documentation

## 2015-10-23 DIAGNOSIS — Z3490 Encounter for supervision of normal pregnancy, unspecified, unspecified trimester: Secondary | ICD-10-CM

## 2015-10-23 DIAGNOSIS — N926 Irregular menstruation, unspecified: Secondary | ICD-10-CM | POA: Insufficient documentation

## 2015-10-23 DIAGNOSIS — K5900 Constipation, unspecified: Secondary | ICD-10-CM | POA: Insufficient documentation

## 2015-10-23 LAB — POC URINE PREG, ED: Preg Test, Ur: POSITIVE — AB

## 2015-10-23 MED ORDER — CEPHALEXIN 500 MG PO CAPS: 500.0000 mg | ORAL_CAPSULE | Freq: Two times a day (BID) | ORAL | 0 refills | Status: DC

## 2015-10-23 MED ORDER — CEPHALEXIN 250 MG PO CAPS
500.0000 mg | ORAL_CAPSULE | Freq: Once | ORAL | Status: AC
  Administered 2015-10-23: 500 mg via ORAL
  Filled 2015-10-23: qty 2

## 2015-10-23 MED ORDER — PRENATAL COMPLETE 14-0.4 MG PO TABS: 1.0000 | ORAL_TABLET | Freq: Two times a day (BID) | ORAL | 2 refills | Status: DC

## 2015-10-23 NOTE — ED Provider Notes (Signed)
[facility]-EMERGENCY DEPT Provider Note   CSN: [id number] Arrival date & time: 10/23/15  [id number]     History   Chief Complaint Chief Complaint  Patient presents with  . Mouth Lesions    HPI Lindsey Velazquez is a 25 y.o. female.  HPI   Patient has PMH of anxiety, asthma, depression, and eczema.  She is here for 3 complaints. 1. Constipation- she was constipated for 1 week, took milk of magnesia and had a large bowel movement, she is no longer constipated. 2. She has not had a menstrual cycle since July- unsure if she is pregnant. 3. Swelling to upper lip. Woke up a few days ago with lip irritating her, she "messed" with it and now it is swollen. Not painful or itchy. It has an associated scab.  Past Medical History:  Diagnosis Date  . Anxiety   . Asthma   . Depression   . Eczema     Patient Active Problem List   Diagnosis Date Noted  . Neutropenia (HCC) 12/09/2013  . Contraception management 02/16/2013  . Rubella non-immune status, antepartum 09/27/2011    Past Surgical History:  Procedure Laterality Date  . NO PAST SURGERIES      OB History    Gravida Para Term Preterm AB Living   4 3 2 1 1 3    SAB TAB Ectopic Multiple Live Births   1 0 0 0 3       Home Medications    Prior to Admission medications   Medication Sig Start Date End Date Taking? Authorizing Provider  Prenatal Vit-Fe Fumarate-FA (PRENATAL COMPLETE) 14-0.4 MG TABS Take 1 tablet by mouth 2 (two) times daily. 10/23/15   Marlon Peliffany Finis Hendricksen, PA-C    Family History Family History  Problem Relation Age of Onset  . Diabetes Mother   . Thyroid disease Sister     Social History Social History  Substance Use Topics  . Smoking status: Current Every Day Smoker    Types: Cigarettes  . Smokeless tobacco: Never Used  . Alcohol use Yes     Allergies   Clindamycin/lincomycin and Penicillins   Review of Systems Review of Systems  Review of Systems All other systems negative except as documented in the  HPI. All pertinent positives and negatives as reviewed in the HPI.  Physical Exam Updated Vital Signs BP 119/75   Pulse 108   Temp 98.5 F (36.9 C)   Resp 16   Ht 4\' 10"  (1.473 m)   Wt 55.1 kg   LMP 08/22/2015   SpO2 100%   BMI 25.39 kg/m   Physical Exam  Constitutional: She appears well-developed and well-nourished. No distress.  HENT:  Head: Normocephalic and atraumatic.    Eyes: Pupils are equal, round, and reactive to light.  Neck: Normal range of motion. Neck supple.  Cardiovascular: Normal rate and regular rhythm.   Pulmonary/Chest: Effort normal.  Abdominal: Soft. Bowel sounds are normal. She exhibits no distension. There is no tenderness. There is no rigidity, no rebound and no guarding.  Neurological: She is alert.  Skin: Skin is warm and dry.  Nursing note and vitals reviewed.  ED Treatments / Results  Labs (all labs ordered are listed, but only abnormal results are displayed) Labs Reviewed  POC URINE PREG, ED - Abnormal; Notable for the following:       Result Value   Preg Test, Ur POSITIVE (*)    All other components within normal limits    EKG  EKG Interpretation None  Radiology No results found.  Procedures Procedures (including critical care time)  Medications Ordered in ED Medications  cephALEXin (KEFLEX) capsule 500 mg (500 mg Oral Given 10/23/15 0601)     Initial Impression / Assessment and Plan / ED Course  I have reviewed the triage vital signs and the nursing notes.  Pertinent labs & imaging results that were available during my care of the patient were reviewed by me and considered in my medical decision making (see chart for details).  Clinical Course    Urine pregnancy test is positive, will refer to womens outpatient clinic and start on prenatals. Will give rx for Miralax to help regulate her system. Rx: Keflex for rash to lip, concern for infection.  Final Clinical Impressions(s) / ED Diagnoses   Final diagnoses:    Constipation, unspecified constipation type  Mouth pain  Abnormal menstrual cycle  Pregnant    New Prescriptions New Prescriptions   PRENATAL VIT-FE FUMARATE-FA (PRENATAL COMPLETE) 14-0.4 MG TABS    Take 1 tablet by mouth 2 (two) times daily.     Marlon Pel, PA-C 10/31/15 2006    Zadie Rhine, MD 11/01/15 [phone]

## 2015-10-23 NOTE — ED Triage Notes (Signed)
The pt is c/o a lesion on her upper lip for  2 days.  No bm for one week  lmp  july

## 2015-12-21 ENCOUNTER — Other Ambulatory Visit: Payer: Self-pay | Admitting: Advanced Practice Midwife

## 2015-12-21 DIAGNOSIS — Z3482 Encounter for supervision of other normal pregnancy, second trimester: Secondary | ICD-10-CM

## 2015-12-21 LAB — HM PAP SMEAR: HM Pap smear: NEGATIVE

## 2015-12-22 LAB — OB RESULTS CONSOLE HEPATITIS B SURFACE ANTIGEN: HEP B S AG: NEGATIVE

## 2015-12-22 LAB — OB RESULTS CONSOLE VARICELLA ZOSTER ANTIBODY, IGG: VARICELLA IGG: NON-IMMUNE/NOT IMMUNE

## 2015-12-22 LAB — OB RESULTS CONSOLE ANTIBODY SCREEN: Antibody Screen: NEGATIVE

## 2015-12-22 LAB — OB RESULTS CONSOLE RPR: RPR: NONREACTIVE

## 2015-12-23 LAB — OB RESULTS CONSOLE GC/CHLAMYDIA
Chlamydia: NEGATIVE
GC PROBE AMP, GENITAL: NEGATIVE

## 2015-12-28 ENCOUNTER — Ambulatory Visit
Admission: RE | Admit: 2015-12-28 | Discharge: 2015-12-28 | Disposition: A | Discharge status: Home / Self Care | Payer: Self-pay | Source: Ambulatory Visit | Attending: Advanced Practice Midwife | Admitting: Advanced Practice Midwife

## 2015-12-28 DIAGNOSIS — Z3482 Encounter for supervision of other normal pregnancy, second trimester: Secondary | ICD-10-CM | POA: Insufficient documentation

## 2015-12-28 DIAGNOSIS — Z3A18 18 weeks gestation of pregnancy: Secondary | ICD-10-CM | POA: Insufficient documentation

## 2016-02-13 NOTE — L&D Delivery Note (Signed)
Patient is 26 y.o. [id number] [redacted]w[redacted]d admitted SOL, GBS negative   Delivery Note At 11:40 AM a viable female was delivered via Vaginal, Spontaneous Delivery. Initially assessed patient due to late decelerations and variables and found to be complete and ready to push. Head was initially ROP and was making little progress. Because fetus continued to have late decelerations, were preparing for vacuum but then patient's head turned and became ROA and was able to push. Head delivered ROA and anterior shoulder delivered with ease followed by the rest of the baby.  APGAR: 9, 10; weight pending. Placenta was intact and delivered spontaneously. Cord: 3 vessel.   Anesthesia:  Epidural Episiotomy: None Lacerations: Periurethral abrations Suture Repair: Est. Blood Loss (mL): 100  Mom to postpartum.  Baby to Couplet care / Skin to Skin.  Durenda Hurt 05/25/2016, 12:03 PM  Patient is a [id number] at [redacted]w[redacted]d who was admitted with SOL, uncomplicated prenatal course.  She progressed with augmentation via AROM.  I was gloved and present for delivery in its entirety.  Second stage of labor progressed, baby delivered shorly after head rotated, at which point prior to that we had been planning for a VAVD due to FHR decels.  Complications: none  Lacerations: no repair  EBL: 100cc  Cashus Halterman, CNM 7:08 PM  05/25/2016

## 2016-02-20 ENCOUNTER — Emergency Department: Admission: EM | Admit: 2016-02-20 | Discharge: 2016-02-20 | Discharge status: Against Medical Advice | Payer: Self-pay

## 2016-02-21 ENCOUNTER — Telehealth: Payer: Self-pay | Admitting: Emergency Medicine
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
## 2016-03-06 ENCOUNTER — Encounter (HOSPITAL_COMMUNITY): Payer: Self-pay

## 2016-03-06 ENCOUNTER — Inpatient Hospital Stay (HOSPITAL_COMMUNITY)
Admission: AD | Admit: 2016-03-06 | Discharge: 2016-03-06 | Disposition: A | Discharge status: Home / Self Care | Payer: Medicaid Other | Source: Ambulatory Visit | Attending: Family Medicine | Admitting: Family Medicine

## 2016-03-06 DIAGNOSIS — Z2821 Immunization not carried out because of patient refusal: Secondary | ICD-10-CM

## 2016-03-06 DIAGNOSIS — Z3A28 28 weeks gestation of pregnancy: Secondary | ICD-10-CM

## 2016-03-06 DIAGNOSIS — Z87891 Personal history of nicotine dependence: Secondary | ICD-10-CM | POA: Insufficient documentation

## 2016-03-06 DIAGNOSIS — R52 Pain, unspecified: Secondary | ICD-10-CM | POA: Insufficient documentation

## 2016-03-06 DIAGNOSIS — O99513 Diseases of the respiratory system complicating pregnancy, third trimester: Secondary | ICD-10-CM | POA: Diagnosis not present

## 2016-03-06 DIAGNOSIS — J111 Influenza due to unidentified influenza virus with other respiratory manifestations: Secondary | ICD-10-CM

## 2016-03-06 DIAGNOSIS — O26893 Other specified pregnancy related conditions, third trimester: Secondary | ICD-10-CM | POA: Insufficient documentation

## 2016-03-06 LAB — CBC WITH DIFFERENTIAL/PLATELET
BASOS ABS: 0 10*3/uL (ref 0.0–0.1)
BASOS PCT: 0 %
Eosinophils Absolute: 0 10*3/uL (ref 0.0–0.7)
Eosinophils Relative: 1 %
HEMATOCRIT: 27.4 % — AB (ref 36.0–46.0)
HEMOGLOBIN: 9.8 g/dL — AB (ref 12.0–15.0)
Lymphocytes Relative: 27 %
Lymphs Abs: 1.4 10*3/uL (ref 0.7–4.0)
MCH: 32.6 pg (ref 26.0–34.0)
MCHC: 35.8 g/dL (ref 30.0–36.0)
MCV: 91 fL (ref 78.0–100.0)
MONO ABS: 0.4 10*3/uL (ref 0.1–1.0)
Monocytes Relative: 8 %
NEUTROS ABS: 3.2 10*3/uL (ref 1.7–7.7)
NEUTROS PCT: 64 %
Platelets: 195 10*3/uL (ref 150–400)
RBC: 3.01 MIL/uL — ABNORMAL LOW (ref 3.87–5.11)
RDW: 13.6 % (ref 11.5–15.5)
WBC: 5 10*3/uL (ref 4.0–10.5)

## 2016-03-06 LAB — INFLUENZA PANEL BY PCR (TYPE A & B)
INFLBPCR: POSITIVE — AB
Influenza A By PCR: NEGATIVE

## 2016-03-06 LAB — URINALYSIS, ROUTINE W REFLEX MICROSCOPIC
Bilirubin Urine: NEGATIVE
Glucose, UA: 50 mg/dL — AB
HGB URINE DIPSTICK: NEGATIVE
Ketones, ur: 80 mg/dL — AB
NITRITE: NEGATIVE
PROTEIN: NEGATIVE mg/dL
SPECIFIC GRAVITY, URINE: 1.019 (ref 1.005–1.030)
pH: 6 (ref 5.0–8.0)

## 2016-03-06 MED ORDER — OSELTAMIVIR PHOSPHATE 75 MG PO CAPS: 75.0000 mg | ORAL_CAPSULE | Freq: Two times a day (BID) | ORAL | 0 refills | Status: AC

## 2016-03-06 MED ORDER — OSELTAMIVIR PHOSPHATE 75 MG PO CAPS
75.0000 mg | ORAL_CAPSULE | Freq: Once | ORAL | Status: AC
  Administered 2016-03-06: 75 mg via ORAL
  Filled 2016-03-06: qty 1

## 2016-03-06 NOTE — Discharge Instructions (Signed)
Influenza Virus Vaccine injection What is this medicine? INFLUENZA VIRUS VACCINE (in floo EN zuh VAHY ruhs vak SEEN) helps to reduce the risk of getting influenza also known as the flu. The vaccine only helps protect you against some strains of the flu. This medicine may be used for other purposes; ask your health care provider or pharmacist if you have questions. COMMON BRAND NAME(S): Afluria, Agriflu, Alfuria, FLUAD, Fluarix, Fluarix Quadrivalent, Flublok, Flublok Quadrivalent, FLUCELVAX, Flulaval, Fluvirin, Fluzone, Fluzone High-Dose, Fluzone Intradermal What should I tell my health care provider before I take this medicine? They need to know if you have any of these conditions: -bleeding disorder like hemophilia -fever or infection -Guillain-Barre syndrome or other neurological problems -immune system problems -infection with the human immunodeficiency virus (HIV) or AIDS -low blood platelet counts -multiple sclerosis -an unusual or allergic reaction to influenza virus vaccine, latex, other medicines, foods, dyes, or preservatives. Different brands of vaccines contain different allergens. Some may contain latex or eggs. Talk to your doctor about your allergies to make sure that you get the right vaccine. -pregnant or trying to get pregnant -breast-feeding How should I use this medicine? This vaccine is for injection into a muscle or under the skin. It is given by a health care professional. A copy of Vaccine Information Statements will be given before each vaccination. Read this sheet carefully each time. The sheet may change frequently. Talk to your healthcare provider to see which vaccines are right for you. Some vaccines should not be used in all age groups. Overdosage: If you think you have taken too much of this medicine contact a poison control center or emergency room at once. NOTE: This medicine is only for you. Do not share this medicine with others. What if I miss a dose? This  does not apply. What may interact with this medicine? -chemotherapy or radiation therapy -medicines that lower your immune system like etanercept, anakinra, infliximab, and adalimumab -medicines that treat or prevent blood clots like warfarin -phenytoin -steroid medicines like prednisone or cortisone -theophylline -vaccines This list may not describe all possible interactions. Give your health care provider a list of all the medicines, herbs, non-prescription drugs, or dietary supplements you use. Also tell them if you smoke, drink alcohol, or use illegal drugs. Some items may interact with your medicine. What should I watch for while using this medicine? Report any side effects that do not go away within 3 days to your doctor or health care professional. Call your health care provider if any unusual symptoms occur within 6 weeks of receiving this vaccine. You may still catch the flu, but the illness is not usually as bad. You cannot get the flu from the vaccine. The vaccine will not protect against colds or other illnesses that may cause fever. The vaccine is needed every year. What side effects may I notice from receiving this medicine? Side effects that you should report to your doctor or health care professional as soon as possible: -allergic reactions like skin rash, itching or hives, swelling of the face, lips, or tongue Side effects that usually do not require medical attention (report to your doctor or health care professional if they continue or are bothersome): -fever -headache -muscle aches and pains -pain, tenderness, redness, or swelling at the injection site -tiredness This list may not describe all possible side effects. Call your doctor for medical advice about side effects. You may report side effects to FDA at 1-800-FDA-1088. Where should I keep my medicine? The vaccine will  be given by a health care professional in a clinic, pharmacy, doctor's office, or other health care  setting. You will not be given vaccine doses to store at home. NOTE: This sheet is a summary. It may not cover all possible information. If you have questions about this medicine, talk to your doctor, pharmacist, or health care provider.  2017 Elsevier/Gold Standard (2014-08-20 10:07:28)   Influenza, Adult Influenza (the flu") is an infection in the lungs, nose, and throat (respiratory tract). It is caused by a virus. The flu causes many common cold symptoms, as well as a high fever and body aches. It can make you feel very sick. The flu spreads easily from person to person (is contagious). Getting a flu shot (influenza vaccination) every year is the best way to prevent the flu. Follow these instructions at home:  Take over-the-counter and prescription medicines only as told by your doctor.  Use a cool mist humidifier to add moisture (humidity) to the air in your home. This can make it easier to breathe.  Rest as needed.  Drink enough fluid to keep your pee (urine) clear or pale yellow.  Cover your mouth and nose when you cough or sneeze.  Wash your hands with soap and water often, especially after you cough or sneeze. If you cannot use soap and water, use hand sanitizer.  Stay home from work or school as told by your doctor. Unless you are visiting your doctor, try to avoid leaving home until your fever has been gone for 24 hours without the use of medicine.  Keep all follow-up visits as told by your doctor. This is important. How is this prevented?  Getting a yearly (annual) flu shot is the best way to avoid getting the flu. You may get the flu shot in late summer, fall, or winter. Ask your doctor when you should get your flu shot.  Wash your hands often or use hand sanitizer often.  Avoid contact with people who are sick during cold and flu season.  Eat healthy foods.  Drink plenty of fluids.  Get enough sleep.  Exercise regularly. Contact a doctor if:  You get new  symptoms.  You have:  Chest pain.  Watery poop (diarrhea).  A fever.  Your cough gets worse.  You start to have more mucus.  You feel sick to your stomach (nauseous).  You throw up (vomit). Get help right away if:  You start to be short of breath or have trouble breathing.  Your skin or nails turn a bluish color.  You have very bad pain or stiffness in your neck.  You get a sudden headache.  You get sudden pain in your face or ear.  You cannot stop throwing up. This information is not intended to replace advice given to you by your health care provider. Make sure you discuss any questions you have with your health care provider. Document Released: 11/08/2007 Document Revised: 07/07/2015 Document Reviewed: 11/23/2014 Elsevier Interactive Patient Education  2017 [employer]

## 2016-03-06 NOTE — MAU Note (Addendum)
Pt states her 26 year old was at the pediatrician today and tested positive for the flu. Pt states she started feeling bad yesterday. Pt states she has a dry cough and body aches. Pt states she "just feels horrible". Pt states her temperature earlier today 100.2. Pt states her right eye feels dry and red. Pt states she has been nauseous and unable to eat today. Pt denies sore throat. Pt states baby is moving good. Pt denies contractions, bleeding, and leaking of fluid. Pt states earlier she was having slight abdominal pressure which resolved after she has a BM.

## 2016-03-06 NOTE — MAU Provider Note (Signed)
Chief Complaint:  Generalized Body Aches   First Provider Initiated Contact with Patient 03/06/16 1849      HPI: Lindsey Velazquez is a 26 y.o. [id number] at 942w5d who presents to maternity admissions reporting body aches and flu like symptoms.  Patient states symptoms started 2 days ago. + DryCough, fatigued, ill feeling, chills, +fevers (not taking but feels like it), decreased appetite, body aches. No congestion or rhinorrhea. No sore throat. Symptoms feel like they're getting worse. Did not get flu vaccine. Has not taken any medications because she did not know what she could take.   Son was positive for the influenza virus, unsure if it was A or B. His symptoms began 3-4 days ago. At pediatrician's office, they took her temperature and it was 100.25F, they recommended for her to be seen.   Denies contractions, leakage of fluid or vaginal bleeding. Good fetal movement.    Past Medical History: Past Medical History:  Diagnosis Date  . Anxiety   . Asthma   . Depression   . Eczema     Past obstetric history: OB History  Gravida Para Term Preterm AB Living  5 3 2 1 1 3   SAB TAB Ectopic Multiple Live Births  1 0 0 0 3    # Outcome Date GA Lbr Len/2nd Weight Sex Delivery Anes PTL Lv  5 Current           4 Term 02/27/12 11050w6d / 00:03 6 lb 8.2 oz (2.954 kg) M Vag-Spont None  LIV  3 SAB 01/2011 5452w0d            Birth Comments: System Generated. Please review and update pregnancy details.  2 Term 09/2009 2226w0d  5 lb 8 oz (2.495 kg) F Vag-Spont None  LIV     [facility]: [facility]  1 Preterm 11/2007 114w0d  3 lb 15 oz (1.786 kg) M Vag-Spont None  LIV     [facility] had [facility], PTD, was on bedrest      Past Surgical History: Past Surgical History:  Procedure Laterality Date  . NO PAST SURGERIES       Family History: Family History  Problem Relation Age of Onset  . Diabetes Mother   . Thyroid disease Sister     Social History: Social History   Substance Use Topics  . Smoking status: Former Smoker    Types: Cigarettes    Quit date: 02/27/2016  . Smokeless tobacco: Never Used  . Alcohol use Yes    Allergies:  Allergies  Allergen Reactions  . Clindamycin/Lincomycin Rash  . Penicillins Hives and Rash    Has patient had a PCN reaction causing immediate rash, facial/tongue/throat swelling, SOB or lightheadedness with hypotension: Yes Has patient had a PCN reaction causing severe rash involving mucus membranes or skin necrosis: No Has patient had a PCN reaction that required hospitalization No Has patient had a PCN reaction occurring within the last 10 years: No If all of the above answers are "NO", then may proceed with Cephalosporin use.     Meds:  No prescriptions prior to admission.    I have reviewed patient's Past Medical Hx, Surgical Hx, Family Hx, Social Hx, medications and allergies.   ROS:  A comprehensive ROS was negative except per HPI.    Physical Exam   Patient Vitals for the past 24 hrs:  BP Temp Temp src Pulse Resp SpO2 Height Weight  03/06/16 1902 108/60 98.3 F (36.8 C) Oral 112  18 100 % - -  03/06/16 1847 - - - 108 - 97 % - -  03/06/16 1832 - - - 106 - 100 % - -  03/06/16 1822 - - - 108 - 97 % - -  03/06/16 1819 116/61 98.9 F (37.2 C) Oral 110 18 96 % 5' (1.524 m) 146 lb (66.2 kg)   Constitutional: Well-developed, well-nourished female in no acute distress, mildly ill appearing.  HEENT: Normal clear TM bilaterally, no posterior pharynx erythema or exudates, no cervical LAD, +boggy turbinates with clear rhinorrhea. Cardiovascular: mildly tachycardic, normal rhythm, no murmurs Respiratory: normal effort, CTAB, no wheezes, rhonchi, rhales.; dry cough noted. GI: Abd soft, non-tender, gravid appropriate for gestational age. Pos BS x 4 MS: Extremities nontender, no edema, normal ROM Neurologic: Alert and oriented x 4.  GU: Neg CVAT.    Labs: Results for orders placed or performed during the  hospital encounter of 03/06/16 (from the past 24 hour(s))  Urinalysis, Routine w reflex microscopic     Status: Abnormal   Collection Time: 03/06/16  6:00 PM  Result Value Ref Range   Color, Urine YELLOW YELLOW   APPearance HAZY (A) CLEAR   Specific Gravity, Urine 1.019 1.005 - 1.030   pH 6.0 5.0 - 8.0   Glucose, UA 50 (A) NEGATIVE mg/dL   Hgb urine dipstick NEGATIVE NEGATIVE   Bilirubin Urine NEGATIVE NEGATIVE   Ketones, ur 80 (A) NEGATIVE mg/dL   Protein, ur NEGATIVE NEGATIVE mg/dL   Nitrite NEGATIVE NEGATIVE   Leukocytes, UA LARGE (A) NEGATIVE   RBC / HPF 0-5 0 - 5 RBC/hpf   WBC, UA 0-5 0 - 5 WBC/hpf   Bacteria, UA RARE (A) NONE SEEN   Squamous Epithelial / LPF 6-30 (A) NONE SEEN   Mucous PRESENT   Influenza panel by PCR (type A & B)     Status: Abnormal   Collection Time: 03/06/16  6:18 PM  Result Value Ref Range   Influenza A By PCR NEGATIVE NEGATIVE   Influenza B By PCR POSITIVE (A) NEGATIVE  CBC with Differential/Platelet     Status: Abnormal   Collection Time: 03/06/16  6:45 PM  Result Value Ref Range   WBC 5.0 4.0 - 10.5 K/uL   RBC 3.01 (L) 3.87 - 5.11 MIL/uL   Hemoglobin 9.8 (L) 12.0 - 15.0 g/dL   HCT 16.1 (L) 09.6 - 04.5 %   MCV 91.0 78.0 - 100.0 fL   MCH 32.6 26.0 - 34.0 pg   MCHC 35.8 30.0 - 36.0 g/dL   RDW 40.9 81.1 - 91.4 %   Platelets 195 150 - 400 K/uL   Neutrophils Relative % 64 %   Neutro Abs 3.2 1.7 - 7.7 K/uL   Lymphocytes Relative 27 %   Lymphs Abs 1.4 0.7 - 4.0 K/uL   Monocytes Relative 8 %   Monocytes Absolute 0.4 0.1 - 1.0 K/uL   Eosinophils Relative 1 %   Eosinophils Absolute 0.0 0.0 - 0.7 K/uL   Basophils Relative 0 %   Basophils Absolute 0.0 0.0 - 0.1 K/uL     MAU Course: Tamiflu PO hydration (patient refused IVF) CBC w/ diff VSS UA - neg for infection, spec grav 1.019, ketones 80, gluc 50 NST - reactive  I personally reviewed the patient's NST today, found to be REACTIVE. 150 bpm, mod var, +accels, no decels. CTX:  NONE.   MDM: Plan of care reviewed with patient, including labs and tests ordered and medical treatment. Patient refused IV fluids,  but was tolerating PO intake and was constantly hydrating at bedside.  no signs or symptoms of pneumonia with normal white count on CBC and no fevers.  Discussed with patient signs and symptoms that would warrant return to the ER, including but not limited to fevers that are unresponsive to Tylenol, worsening symptoms of cough, shortness of breath, overall worsening symptoms.   Assessment: 1. Influenza     Plan: Discharge home in stable condition.  Preterm Labor precautions and fetal kick counts Tamiflu PO hydration encouraged F/U with Ob provider Return precautions given.  Follow-up Information    [facility]. Schedule an appointment as soon as possible for a visit in 1 week(s).   Why:  Follow up on symptoms and routine OB visit Contact information: [address] [place] [address] [phone]           Allergies as of 03/06/2016      Reactions   Clindamycin/lincomycin Rash   Penicillins Hives, Rash   Has patient had a PCN reaction causing immediate rash, facial/tongue/throat swelling, SOB or lightheadedness with hypotension: Yes Has patient had a PCN reaction causing severe rash involving mucus membranes or skin necrosis: No Has patient had a PCN reaction that required hospitalization No Has patient had a PCN reaction occurring within the last 10 years: No If all of the above answers are "NO", then may proceed with Cephalosporin use.      Medication List    TAKE these medications   FERROUSUL 325 (65 FE) MG tablet Generic drug:  ferrous sulfate Take 325 mg by mouth 2 (two) times daily with a meal.   oseltamivir 75 MG capsule Commonly known as:  TAMIFLU Take 1 capsule (75 mg total) by mouth 2 (two) times daily.   PRENATAL COMPLETE 14-0.4 MG Tabs Take 1 tablet by mouth 2 (two) times daily. What changed:  when to  take this       Jen Mow, DO OB Fellow Center for [facility], [facility] 03/06/2016 10:26 PM

## 2016-03-19 ENCOUNTER — Inpatient Hospital Stay
Admission: EM | Admit: 2016-03-19 | Discharge: 2016-03-19 | Disposition: A | Discharge status: Home / Self Care | Payer: Medicaid Other | Attending: Obstetrics and Gynecology | Admitting: Obstetrics and Gynecology

## 2016-03-19 ENCOUNTER — Other Ambulatory Visit: Payer: Self-pay | Admitting: Obstetrics and Gynecology

## 2016-03-19 DIAGNOSIS — Z3A30 30 weeks gestation of pregnancy: Secondary | ICD-10-CM | POA: Insufficient documentation

## 2016-03-19 DIAGNOSIS — Z3493 Encounter for supervision of normal pregnancy, unspecified, third trimester: Secondary | ICD-10-CM | POA: Diagnosis not present

## 2016-03-19 LAB — URINALYSIS, COMPLETE (UACMP) WITH MICROSCOPIC
BACTERIA UA: NONE SEEN
Bilirubin Urine: NEGATIVE
Glucose, UA: 50 mg/dL — AB
Hgb urine dipstick: NEGATIVE
Ketones, ur: NEGATIVE mg/dL
NITRITE: NEGATIVE
Protein, ur: 30 mg/dL — AB
SPECIFIC GRAVITY, URINE: 1.029 (ref 1.005–1.030)
pH: 6 (ref 5.0–8.0)

## 2016-03-19 LAB — RAPID HIV SCREEN (HIV 1/2 AB+AG)
HIV 1/2 Antibodies: NONREACTIVE
HIV-1 P24 ANTIGEN - HIV24: NONREACTIVE

## 2016-03-19 LAB — FETAL FIBRONECTIN: FETAL FIBRONECTIN: NEGATIVE

## 2016-03-19 MED ORDER — BETAMETHASONE SOD PHOS & ACET 6 (3-3) MG/ML IJ SUSP
12.0000 mg | INTRAMUSCULAR | Status: DC
  Administered 2016-03-19: 12 mg via INTRAMUSCULAR
  Filled 2016-03-19: qty 1

## 2016-03-19 MED ORDER — BETAMETHASONE SOD PHOS & ACET 6 (3-3) MG/ML IJ SUSP: 12.5000 mg | Freq: Once | INTRAMUSCULAR | 0 refills | Status: AC

## 2016-03-19 MED ORDER — TERBUTALINE SULFATE 1 MG/ML IJ SOLN
0.2500 mg | Freq: Once | INTRAMUSCULAR | Status: AC
  Administered 2016-03-19: 0.25 mg via SUBCUTANEOUS
  Filled 2016-03-19: qty 1

## 2016-03-19 MED ORDER — TERBUTALINE SULFATE 1 MG/ML IJ SOLN: 0.2500 mg | Freq: Once | INTRAMUSCULAR | Status: DC

## 2016-03-19 MED ORDER — TERBUTALINE SULFATE 1 MG/ML IJ SOLN
0.2500 mg | Freq: Once | INTRAMUSCULAR | Status: AC
  Administered 2016-03-19: 0.25 mg via SUBCUTANEOUS

## 2016-03-19 NOTE — OB Triage Note (Signed)
Pt arrived to obs rm 2 from ED with c/o groin/pelvic pain with a 5/10 pain score starting yesterday 03/20/2016. Pt stating pain is constant and comes and goes. Pt stated not taking any pain medicine or other means to help pain. Pt not sure if pain is from contractions or not. No leaking of fluid, bleeding, no recent sex. Pt stated good fetal movement.  Pt placed on monitor and oriented to room.

## 2016-03-19 NOTE — H&P (Signed)
[name] is a 26 y.o. female G5{3presenting for "pelvic pressure and poss UC's" becoming uncomfortable today. PNC at [facility] significant for 1 PTD at 28-30 weeks and since delivered 38 and 40 week pregnancy. Pt is currently getting 17 hp shots weekly but, she has not attended all of the appts according to her records. LMP was 08/27/15 & EDD was 06/02/16 and US at 18 6/7 weeks with EDD of 05/24/16.  OB History    Gravida Para Term Preterm AB Living   5 3 2 1 1 3    SAB TAB Ectopic Multiple Live Births   1 0 0 0 3     Past Medical History:  Diagnosis Date  . Anxiety   . Asthma   . Depression   . Eczema   Past hx of ETOH abuse Past Surgical History:  Procedure Laterality Date  . NO PAST SURGERIES     Family History: family history includes Diabetes in her mother; Thyroid disease in her sister. Social History:  reports that she quit smoking about 3 weeks ago. Her smoking use included Cigarettes. She has never used smokeless tobacco. She reports that she drinks alcohol. She reports that she does not use drugs. Allergy:    Maternal Diabetes:  Genetic Screening:  Maternal Ultrasounds/Referrals:  Fetal Ultrasounds or other Referrals:   Maternal Substance Abuse:  Significant Maternal Medications:  Significant Maternal Lab Results:   Other Comments:    ROS  Gen:No weight loss, no fatigue, no fever HEENT: No HA, no ear or eye complaints, no sore thrat Heart: no palpitations, no CP or SOB Lungs: NO SOB, no cough, no resp distress Abd: Gravid, +fetal movement GI: No N,V,C,D GU: voiding without diff, no dysuria MS: Moving all extems, no complaints Neuro: no dizziness, no lightheadedness, A,A&O x 3 GYN: No lOF, NO VB, No abnormal vag dc or odor History Dilation: 1 Effacement (%): 40 Station: Ballotable Exam by:: JCM Blood pressure (!) 107/57, pulse 88, temperature 98.1 F (36.7 C), temperature source Oral. Exam Physical Exam  Prenatal labs: ABO, Rh:   Antibody:   Rubella:    RPR:    HBsAg:    HIV:    GBS:    Varicella non-immune, Smoker 2 cpd,  Assessment/Plan: A: IUP at 30 weeks with Th PTL 2. Previous PTD at 28-30 weeks and on 17 hp P: Hydration po 2. BMZ 1st dose today along with 2 doses of Terb with arrest of UC's.  3. Monitor extensively and pt was not in a labor pattern. 4. Cx was 1/30% per RN 5. Home to rest, FU here for any concerns.   Sharee Pimplearon W [name] 03/19/2016, 1:46 PM

## 2016-03-19 NOTE — Discharge Instructions (Signed)
Discharge instructions given, and labor precautions given. Please return to labor and deliver tomorrow 03/19/2016 at 2pm (arrive 15 minutes early)

## 2016-03-20 ENCOUNTER — Inpatient Hospital Stay
Admission: EM | Admit: 2016-03-20 | Discharge: 2016-03-20 | Disposition: A | Discharge status: Home / Self Care | Payer: Medicaid Other | Attending: Obstetrics and Gynecology | Admitting: Obstetrics and Gynecology

## 2016-03-20 DIAGNOSIS — O4703 False labor before 37 completed weeks of gestation, third trimester: Secondary | ICD-10-CM | POA: Diagnosis not present

## 2016-03-20 MED ORDER — BETAMETHASONE SOD PHOS & ACET 6 (3-3) MG/ML IJ SUSP
INTRAMUSCULAR | Status: AC
  Administered 2016-03-20: 12 mg via INTRAMUSCULAR
  Filled 2016-03-20: qty 1

## 2016-03-20 MED ORDER — BETAMETHASONE SOD PHOS & ACET 6 (3-3) MG/ML IJ SUSP
12.0000 mg | Freq: Once | INTRAMUSCULAR | Status: AC
  Administered 2016-03-20: 12 mg via INTRAMUSCULAR

## 2016-03-21 ENCOUNTER — Observation Stay
Admission: EM | Admit: 2016-03-21 | Discharge: 2016-03-21 | Disposition: A | Discharge status: Home / Self Care | Payer: Medicaid Other | Attending: Obstetrics and Gynecology | Admitting: Obstetrics and Gynecology

## 2016-03-21 ENCOUNTER — Encounter: Payer: Self-pay | Admitting: *Deleted

## 2016-03-21 DIAGNOSIS — Z79899 Other long term (current) drug therapy: Secondary | ICD-10-CM | POA: Diagnosis not present

## 2016-03-21 DIAGNOSIS — Z8751 Personal history of pre-term labor: Secondary | ICD-10-CM | POA: Diagnosis not present

## 2016-03-21 DIAGNOSIS — Z87891 Personal history of nicotine dependence: Secondary | ICD-10-CM | POA: Diagnosis not present

## 2016-03-21 DIAGNOSIS — Z3A30 30 weeks gestation of pregnancy: Secondary | ICD-10-CM | POA: Insufficient documentation

## 2016-03-21 DIAGNOSIS — O4703 False labor before 37 completed weeks of gestation, third trimester: Principal | ICD-10-CM | POA: Insufficient documentation

## 2016-03-21 LAB — URINE CULTURE: Culture: NO GROWTH

## 2016-03-21 LAB — CULTURE, BETA STREP (GROUP B ONLY)

## 2016-03-21 NOTE — OB Triage Note (Signed)
Pt. Is concerned she is having ctx. Unable to tell RN how far apart her ctx are. Stated she was having about 2 ctx an hour when asked. Denies vaginal bleeding. Denies any LOF. Lindsey Velazquez, [name]

## 2016-03-21 NOTE — Progress Notes (Signed)
Patient ID: Lindsey Velazquez, female   DOB: 04/12/1990, 26 y.o.   MRN: [id number] Lindsey Velazquez is a 26 y.o. female G5 3presenting for "pelvic pressure and poss UC's" becoming uncomfortable today. PNC at [facility] significant for 1 PTD at 28-30 weeks and since delivered 38 and 40 week pregnancy. Pt is currently getting 17 hp shots weekly but, she has not attended all of the appts according to her records. LMP was 08/27/15 & EDD was 06/02/16 and US at 18 6/7 weeks with EDD of 05/24/16.  OB History    Gravida Para Term Preterm AB Living   5 3 2 1 1 3    SAB TAB Ectopic Multiple Live Births   1 0 0 0 3         Past Medical History:  Diagnosis Date  . Anxiety   . Asthma   . Depression   . Eczema   Past hx of ETOH abuse      Past Surgical History:  Procedure Laterality Date  . NO PAST SURGERIES     Family History: family history includes Diabetes in her mother; Thyroid disease in her sister. Social History:  reports that she quit smoking about 3 weeks ago. Her smoking use included Cigarettes. She has never used smokeless tobacco. She reports that she drinks alcohol. She reports that she does not use drugs. Allergy:   ROS  Gen:No weight loss, no fatigue, no fever HEENT: No HA, no ear or eye complaints, no sore thrat Heart: no palpitations, no CP or SOB Lungs: NO SOB, no cough, no resp distress Abd: Gravid, +fetal movement GI: No N,V,C,D GU: voiding without diff, no dysuria MS: Moving all extems, no complaints Neuro: no dizziness, no lightheadedness, A,A&O x 3 GYN: No lOF, NO VB, No abnormal vag dc or odor History Dilation: 1 Effacement (%): 40 Station: Ballotable Exam by:RN Vitals:   03/21/16 0938  BP: 111/66  Pulse: 93  Resp: 16  Temp: 97.8 F (36.6 C)  Per phone triage  Varicella non-immune, Smoker 2 cpd,  Assessment/Plan: A: IUP at 30 6/7 weeks with Th PTL 2. Previous PTD at 28-30 weeks and on 17 hp P: Hydration po 2. BMZ 2 doses this week already given  3.  Disc with Dr. Feliberto GottronSchermerhorn and agree she is not a candidate for Procardia due to the studies do not prove prevention of PTL. 4.Pelvic rest. 5. Will dc as soon as baby is reactive with 15 x15 BPM, currently 10 x 10. Juice to pt.   Sharee Pimplearon W Fredna Stricker, RN, MSN, CNM, FNP

## 2016-03-21 NOTE — Discharge Instructions (Signed)
Advise pt to return with UC's contracting q 5 mins x 1 hour, lOF, VB or decreased FM. Pt is aware of B-H's and has received 2 doses of BMZ on Monday and Tues.

## 2016-03-21 NOTE — Discharge Summary (Signed)
Obstetric Discharge Summary   Patient ID: Lindsey Velazquez MRN: [id number] DOB/AGE: 28/02/1990 28 y.o.   Date of Admission: 03/21/2016  Date of Discharge: 03/21/2016  Admitting Diagnosis: IUP at 2267w6d with Threatened PTL  Secondary Diagnosis: Multip  Mode of Delivery: None yet     Discharge Diagnosis: IUP at 30 5/7 weeks with TH PTL   Intrapartum Procedures: NST and toco for labor S/S   Post partum procedures: none yet  Complications: Th PTL   Brief Hospital Course  Lindsey Velazquez is a [id number] who has had BMZ on Monday and Tues (2 doses 24 h apart) and has been having uterine irritability today. Pt was monitored and determined to be having uterine irritability.     Labs: CBC Latest Ref Rng & Units 03/06/2016 08/29/2015 08/04/2014  WBC 4.0 - 10.5 K/uL 5.0 5.5 5.5  Hemoglobin 12.0 - 15.0 g/dL 0.2(V9.8(L) 25.313.3 66.413.8  Hematocrit 36.0 - 46.0 % 27.4(L) 38.7 39.8  Platelets 150 - 400 K/uL 195 258 214   Physical exam:  Blood pressure 111/66, pulse 93, temperature 97.8 F (36.6 C), temperature source Oral, resp. rate 16, height 5' (1.524 m), weight 66.2 kg (146 lb). General: alert and no distress HEENT: Normocephalic, Eyes non-icteric.  HEART: S1S2, RRR, No M/R/G. LUNGS: CTA bilat, No W/R/R. Extremities: No evidence of DVT seen on physical exam. No lower extremity edema.  Discharge Instructions: Per After Visit Summary. Activity: Advance as tolerated. Pelvic rest for 6 weeks.  Also refer to After Visit Summary Diet: Regular Medications: Allergies as of 03/21/2016      Reactions   Clindamycin/lincomycin Rash   Penicillins Hives, Rash   Has patient had a PCN reaction causing immediate rash, facial/tongue/throat swelling, SOB or lightheadedness with hypotension: Yes Has patient had a PCN reaction causing severe rash involving mucus membranes or skin necrosis: No Has patient had a PCN reaction that required hospitalization No Has patient had a PCN reaction occurring within the last 10  years: No If all of the above answers are "NO", then may proceed with Cephalosporin use.      Medication List    STOP taking these medications   hydroxyprogesterone caproate 250 mg/mL Oil injection Commonly known as:  MAKENA     TAKE these medications   FERROUSUL 325 (65 FE) MG tablet Generic drug:  ferrous sulfate Take 325 mg by mouth 2 (two) times daily with a meal.   PRENATAL COMPLETE 14-0.4 MG Tabs Take 1 tablet by mouth 2 (two) times daily. What changed:  when to take this      Outpatient follow up:  Follow-up Information    [facility].   Why:  keep next appt.   pelvic rest til next appt. Contact information: [address] [facility] [place] [phone]         Postpartum contraception: not decided  Discharged Condition: Stable   Discharged to: Home   Newborn Data:  None yet  Disposition: Home  Apgars: APGAR (1 MIN): This patient has no babies on file.  APGAR (5 MINS): This patient has no babies on file.  APGAR (10 MINS): This patient has no babies on file.   Sharee Pimplearon W Danni Shima, CNM 03/21/2016

## 2016-04-27 ENCOUNTER — Encounter: Payer: Self-pay | Admitting: *Deleted

## 2016-04-27 ENCOUNTER — Inpatient Hospital Stay
Admission: EM | Admit: 2016-04-27 | Discharge: 2016-04-27 | Disposition: A | Discharge status: Home / Self Care | Payer: Medicaid Other | Attending: Obstetrics and Gynecology | Admitting: Obstetrics and Gynecology

## 2016-04-27 DIAGNOSIS — O479 False labor, unspecified: Secondary | ICD-10-CM | POA: Diagnosis present

## 2016-04-27 DIAGNOSIS — Z3A00 Weeks of gestation of pregnancy not specified: Secondary | ICD-10-CM | POA: Diagnosis not present

## 2016-04-27 NOTE — OB Triage Note (Signed)
[id number] presents at 3727w1d w c/o ctx, since 1430. Pt states she woke up from a nap uncomfortable. Rates contractions 5-6/10. Feels like lower abdominal cramping and upper abdominal tightening.

## 2016-04-27 NOTE — Discharge Summary (Signed)
Pt d/c'd home in stable condition.Labor precaution instructions given, verbalized understanding. Follow up appointment made.

## 2016-05-25 ENCOUNTER — Inpatient Hospital Stay (HOSPITAL_COMMUNITY): Payer: Medicaid Other | Admitting: Anesthesiology

## 2016-05-25 ENCOUNTER — Inpatient Hospital Stay (HOSPITAL_COMMUNITY)
Admission: AD | Admit: 2016-05-25 | Discharge: 2016-05-27 | DRG: 775 | Disposition: A | Discharge status: Home / Self Care | Payer: Medicaid Other | Source: Ambulatory Visit | Attending: Obstetrics & Gynecology | Admitting: Obstetrics & Gynecology

## 2016-05-25 ENCOUNTER — Encounter (HOSPITAL_COMMUNITY): Payer: Self-pay | Admitting: *Deleted

## 2016-05-25 DIAGNOSIS — Z833 Family history of diabetes mellitus: Secondary | ICD-10-CM

## 2016-05-25 DIAGNOSIS — Z880 Allergy status to penicillin: Secondary | ICD-10-CM | POA: Diagnosis not present

## 2016-05-25 DIAGNOSIS — Z3A40 40 weeks gestation of pregnancy: Secondary | ICD-10-CM

## 2016-05-25 DIAGNOSIS — Z3493 Encounter for supervision of normal pregnancy, unspecified, third trimester: Secondary | ICD-10-CM | POA: Diagnosis present

## 2016-05-25 DIAGNOSIS — Z87891 Personal history of nicotine dependence: Secondary | ICD-10-CM | POA: Diagnosis not present

## 2016-05-25 LAB — CBC
HEMATOCRIT: 30.8 % — AB (ref 36.0–46.0)
HEMOGLOBIN: 10.9 g/dL — AB (ref 12.0–15.0)
MCH: 32.9 pg (ref 26.0–34.0)
MCHC: 35.4 g/dL (ref 30.0–36.0)
MCV: 93.1 fL (ref 78.0–100.0)
Platelets: 187 10*3/uL (ref 150–400)
RBC: 3.31 MIL/uL — AB (ref 3.87–5.11)
RDW: 14.7 % (ref 11.5–15.5)
WBC: 7.8 10*3/uL (ref 4.0–10.5)

## 2016-05-25 LAB — GROUP B STREP BY PCR: GROUP B STREP BY PCR: NEGATIVE

## 2016-05-25 LAB — TYPE AND SCREEN
ABO/RH(D): B POS
Antibody Screen: NEGATIVE

## 2016-05-25 MED ORDER — LIDOCAINE HCL (PF) 1 % IJ SOLN
INTRAMUSCULAR | Status: DC | PRN
  Administered 2016-05-25: 13 mL via EPIDURAL

## 2016-05-25 MED ORDER — PRENATAL MULTIVITAMIN CH
1.0000 | ORAL_TABLET | Freq: Every day | ORAL | Status: DC
  Administered 2016-05-25 – 2016-05-26 (×2): 1 via ORAL
  Filled 2016-05-25 (×2): qty 1

## 2016-05-25 MED ORDER — TETANUS-DIPHTH-ACELL PERTUSSIS 5-2.5-18.5 LF-MCG/0.5 IM SUSP: 0.5000 mL | Freq: Once | INTRAMUSCULAR | Status: DC

## 2016-05-25 MED ORDER — ONDANSETRON HCL 4 MG PO TABS: 4.0000 mg | ORAL_TABLET | ORAL | Status: DC | PRN

## 2016-05-25 MED ORDER — BENZOCAINE-MENTHOL 20-0.5 % EX AERO: 1.0000 "application " | INHALATION_SPRAY | CUTANEOUS | Status: DC | PRN

## 2016-05-25 MED ORDER — DIPHENHYDRAMINE HCL 25 MG PO CAPS: 25.0000 mg | ORAL_CAPSULE | Freq: Four times a day (QID) | ORAL | Status: DC | PRN

## 2016-05-25 MED ORDER — HEPARIN SODIUM (PORCINE) 5000 UNIT/ML IJ SOLN
5000.0000 [IU] | Freq: Three times a day (TID) | INTRAMUSCULAR | Status: DC
  Filled 2016-05-25 (×4): qty 1

## 2016-05-25 MED ORDER — DIBUCAINE 1 % RE OINT: 1.0000 "application " | TOPICAL_OINTMENT | RECTAL | Status: DC | PRN

## 2016-05-25 MED ORDER — PHENYLEPHRINE 40 MCG/ML (10ML) SYRINGE FOR IV PUSH (FOR BLOOD PRESSURE SUPPORT)
80.0000 ug | PREFILLED_SYRINGE | INTRAVENOUS | Status: DC | PRN
  Filled 2016-05-25: qty 5

## 2016-05-25 MED ORDER — ACETAMINOPHEN 325 MG PO TABS: 650.0000 mg | ORAL_TABLET | ORAL | Status: DC | PRN

## 2016-05-25 MED ORDER — DIPHENHYDRAMINE HCL 50 MG/ML IJ SOLN: 12.5000 mg | INTRAMUSCULAR | Status: DC | PRN

## 2016-05-25 MED ORDER — OXYCODONE-ACETAMINOPHEN 5-325 MG PO TABS: 1.0000 | ORAL_TABLET | ORAL | Status: DC | PRN

## 2016-05-25 MED ORDER — ONDANSETRON HCL 4 MG/2ML IJ SOLN
4.0000 mg | INTRAMUSCULAR | Status: DC | PRN
Start: 2016-05-25 — End: 2016-05-27

## 2016-05-25 MED ORDER — FENTANYL 2.5 MCG/ML BUPIVACAINE 1/10 % EPIDURAL INFUSION (WH - ANES)
14.0000 mL/h | INTRAMUSCULAR | Status: DC | PRN
  Administered 2016-05-25: 14 mL/h via EPIDURAL
  Filled 2016-05-25: qty 100

## 2016-05-25 MED ORDER — COCONUT OIL OIL: 1.0000 "application " | TOPICAL_OIL | Status: DC | PRN

## 2016-05-25 MED ORDER — FLEET ENEMA 7-19 GM/118ML RE ENEM: 1.0000 | ENEMA | RECTAL | Status: DC | PRN

## 2016-05-25 MED ORDER — OXYTOCIN BOLUS FROM INFUSION
500.0000 mL | Freq: Once | INTRAVENOUS | Status: AC
  Administered 2016-05-25: 500 mL via INTRAVENOUS

## 2016-05-25 MED ORDER — LIDOCAINE HCL (PF) 1 % IJ SOLN
30.0000 mL | INTRAMUSCULAR | Status: DC | PRN
  Filled 2016-05-25: qty 30

## 2016-05-25 MED ORDER — EPHEDRINE 5 MG/ML INJ
10.0000 mg | INTRAVENOUS | Status: DC | PRN
Start: 2016-05-25 — End: 2016-05-25
  Filled 2016-05-25: qty 2

## 2016-05-25 MED ORDER — LACTATED RINGERS IV SOLN: INTRAVENOUS | Status: DC

## 2016-05-25 MED ORDER — OXYCODONE-ACETAMINOPHEN 5-325 MG PO TABS: 2.0000 | ORAL_TABLET | ORAL | Status: DC | PRN

## 2016-05-25 MED ORDER — SENNOSIDES-DOCUSATE SODIUM 8.6-50 MG PO TABS
2.0000 | ORAL_TABLET | ORAL | Status: DC
  Administered 2016-05-26 (×2): 2 via ORAL
  Filled 2016-05-25 (×2): qty 2

## 2016-05-25 MED ORDER — SIMETHICONE 80 MG PO CHEW: 80.0000 mg | CHEWABLE_TABLET | ORAL | Status: DC | PRN

## 2016-05-25 MED ORDER — ZOLPIDEM TARTRATE 5 MG PO TABS: 5.0000 mg | ORAL_TABLET | Freq: Every evening | ORAL | Status: DC | PRN

## 2016-05-25 MED ORDER — IBUPROFEN 600 MG PO TABS
600.0000 mg | ORAL_TABLET | Freq: Four times a day (QID) | ORAL | Status: DC
  Administered 2016-05-25 – 2016-05-27 (×8): 600 mg via ORAL
  Filled 2016-05-25 (×9): qty 1

## 2016-05-25 MED ORDER — ACETAMINOPHEN 325 MG PO TABS
650.0000 mg | ORAL_TABLET | ORAL | Status: DC | PRN
  Administered 2016-05-25: 650 mg via ORAL
  Filled 2016-05-25: qty 2

## 2016-05-25 MED ORDER — SOD CITRATE-CITRIC ACID 500-334 MG/5ML PO SOLN: 30.0000 mL | ORAL | Status: DC | PRN

## 2016-05-25 MED ORDER — FENTANYL CITRATE (PF) 100 MCG/2ML IJ SOLN
50.0000 ug | INTRAMUSCULAR | Status: DC | PRN
  Administered 2016-05-25: 100 ug via INTRAVENOUS
  Filled 2016-05-25: qty 2

## 2016-05-25 MED ORDER — LACTATED RINGERS IV SOLN: 500.0000 mL | INTRAVENOUS | Status: DC | PRN

## 2016-05-25 MED ORDER — LACTATED RINGERS IV SOLN: 500.0000 mL | Freq: Once | INTRAVENOUS | Status: DC

## 2016-05-25 MED ORDER — PHENYLEPHRINE 40 MCG/ML (10ML) SYRINGE FOR IV PUSH (FOR BLOOD PRESSURE SUPPORT)
80.0000 ug | PREFILLED_SYRINGE | INTRAVENOUS | Status: DC | PRN
Start: 2016-05-25 — End: 2016-05-25
  Filled 2016-05-25: qty 5

## 2016-05-25 MED ORDER — WITCH HAZEL-GLYCERIN EX PADS: 1.0000 "application " | MEDICATED_PAD | CUTANEOUS | Status: DC | PRN

## 2016-05-25 MED ORDER — OXYTOCIN 40 UNITS IN LACTATED RINGERS INFUSION - SIMPLE MED
2.5000 [IU]/h | INTRAVENOUS | Status: DC
  Filled 2016-05-25: qty 1000

## 2016-05-25 MED ORDER — ONDANSETRON HCL 4 MG/2ML IJ SOLN: 4.0000 mg | Freq: Four times a day (QID) | INTRAMUSCULAR | Status: DC | PRN

## 2016-05-25 NOTE — Discharge Summary (Signed)
OB Discharge Summary     Patient Name: Lindsey Velazquez DOB: 1990/04/25 MRN: [id number]  Date of admission: 05/25/2016 Delivering MD: Maryjo Rochester L   Date of discharge: 05/27/2016  Admitting diagnosis: 40 WEEKS CTX Intrauterine pregnancy: [redacted]w[redacted]d     Secondary diagnosis:  Active Problems:   NSVD (normal spontaneous vaginal delivery)   Normal labor  Additional problems: none     Discharge diagnosis: Term Pregnancy Delivered                                                                                                Post partum procedures:none  Augmentation: AROM and Pitocin  Complications: None  Hospital course:  Onset of Labor With Vaginal Delivery     26 y.o. yo [id number] at [redacted]w[redacted]d was admitted in Active Labor on 05/25/2016. Patient had an uncomplicated labor course as follows:  Membrane Rupture Time/Date: 10:41 AM ,05/25/2016   Intrapartum Procedures: Episiotomy: None [1]                                         Lacerations:  Periurethral [8]  Patient had a delivery of a Viable infant. 05/25/2016  Information for the patient's newborn:  Lindsey, Velazquez [[id number]]  Delivery Method: Vaginal, Spontaneous Delivery (Filed from Delivery Summary)    Pateint had an uncomplicated postpartum course.  She is ambulating, tolerating a regular diet, passing flatus, and urinating well. Patient is discharged home in stable condition on 05/27/16.   Physical exam  Vitals:   05/25/16 1410 05/25/16 1710 05/26/16 0015 05/26/16 1856  BP: (!) 114/55 (!) 126/46 106/63 (!) 110/49  Pulse: 63 75 90 83  Resp: Temp: 98.4 F (36.9 C) 98.6 F (37 C) 99.1 F (37.3 C) 97.8 F (36.6 C)  TempSrc: Oral Oral Oral Oral  SpO2:   100%   Weight:      Height:       General: alert, cooperative and no distress Lochia: appropriate Uterine Fundus: firm Incision: N/A DVT Evaluation: No evidence of DVT seen on physical exam. Labs: Lab Results  Component Value Date   WBC 7.8  05/25/2016   HGB 10.9 (L) 05/25/2016   HCT 30.8 (L) 05/25/2016   MCV 93.1 05/25/2016   PLT 187 05/25/2016   CMP Latest Ref Rng & Units 08/29/2015  Glucose 65 - 99 mg/dL 91  BUN 6 - 20 mg/dL 14  Creatinine 8.41 - 3.24 mg/dL 4.01  Sodium 027 - 253 mmol/L 136  Potassium 3.5 - 5.1 mmol/L 3.7  Chloride 101 - 111 mmol/L 105  CO2 22 - 32 mmol/L 26  Calcium 8.9 - 10.3 mg/dL 9.1  Total Protein 6.5 - 8.1 g/dL 6.6(Y)  Total Bilirubin 0.3 - 1.2 mg/dL 0.3  Alkaline Phos 38 - 126 U/L 43  AST 15 - 41 U/L 18  ALT 14 - 54 U/L 9(L)    Discharge instruction: per After Visit Summary and "Baby and Me Booklet".  After visit meds:  Allergies as of  05/27/2016      Reactions   Clindamycin/lincomycin Rash   Penicillins Hives, Rash   Has patient had a PCN reaction causing immediate rash, facial/tongue/throat swelling, SOB or lightheadedness with hypotension: Yes Has patient had a PCN reaction causing severe rash involving mucus membranes or skin necrosis: No Has patient had a PCN reaction that required hospitalization No Has patient had a PCN reaction occurring within the last 10 years: No If all of the above answers are "NO", then may proceed with Cephalosporin use.      Medication List    STOP taking these medications   FERROUSUL 325 (65 FE) MG tablet Generic drug:  ferrous sulfate     TAKE these medications   ibuprofen 600 MG tablet Commonly known as:  ADVIL,MOTRIN Take 1 tablet (600 mg total) by mouth every 6 (six) hours.   PRENATAL COMPLETE 14-0.4 MG Tabs Take 1 tablet by mouth 2 (two) times daily. What changed:  when to take this       Diet: routine diet  Activity: Advance as tolerated. Pelvic rest for 6 weeks.   Outpatient follow up:6 weeks Follow up Appt:No future appointments. Follow up Visit:No Follow-up on file.  Postpartum contraception: Depo Provera  Newborn Data: Live born female  Birth Weight: 7 lb 3.3 oz (3269 g) APGAR: 8, 9  Baby Feeding:  Bottle Disposition:home with mother   05/27/2016 Lindsey Hercules, MD

## 2016-05-25 NOTE — Anesthesia Pain Management Evaluation Note (Signed)
  CRNA Pain Management Visit Note  Patient: Lindsey Velazquez, 26 y.o., female  "Hello I am a member of the anesthesia team at [facility]. We have an anesthesia team available at all times to provide care throughout the hospital, including epidural management and anesthesia for C-section. I don't know your plan for the delivery whether it a natural birth, water birth, IV sedation, nitrous supplementation, doula or epidural, but we want to meet your pain goals."   1.Was your pain managed to your expectations on prior hospitalizations?   Yes   2.What is your expectation for pain management during this hospitalization?     Epidural  3.How can we help you reach that goal? Epidural in place.  Record the patient's initial score and the patient's pain goal.   Pain: 0  Pain Goal: 8 [facility] wants you to be able to say your pain was always managed very well.  Lindsey Velazquez L 05/25/2016

## 2016-05-25 NOTE — Progress Notes (Signed)
Labor Progress Note  S: pt resting comfortably. Agreeable to AROM  O:  BP 103/66   Pulse 75   Temp 98 F (36.7 C) (Oral)   Resp 18   Ht 5' (1.524 m)   Wt 68.9 kg (152 lb)   LMP  (Exact Date)   SpO2 100%   BMI 29.69 kg/m  EFM: 120, variables, occasional late after arom FAO:ZHYQMVHQ: 8 Effacement (%): 90 Cervical Position: Middle Station: -1 Presentation: Vertex Exam by:: Earlene Plater, RN    A&P: 25 y.o. [id number] [redacted]w[redacted]d SOL #Labor:expectant #FWB: Category 2 tracing #GBS: pending   Durenda Hurt, MD Resident Physician 10:50 AM

## 2016-05-25 NOTE — Lactation Note (Signed)
This note was copied from a baby's chart. Lactation Consultation Note  Assisted mother to position and attach her baby. Reviewed cue based feeding deeper latch. Mother is questioning her MS. Hand expression taught with colostrum visible. Mother expressed plan to use formula. Explained the increased risk of asthma and allergies to mother but ensured her request would be honored. Information given on support groups and OP services. Follow-up tomorrow.  Patient Name: Lindsey Velazquez [id number] Date: 05/25/2016 Reason for consult: Initial assessment   Maternal Data Has patient been taught Hand Expression?: Yes Does the patient have breastfeeding experience prior to this delivery?: Yes  Feeding Feeding Type: Breast Fed Length of feed: 10 min  LATCH Score/Interventions Latch: Repeated attempts needed to sustain latch, nipple held in mouth throughout feeding, stimulation needed to elicit sucking reflex.  Audible Swallowing: A few with stimulation  Type of Nipple: Everted at rest and after stimulation  Comfort (Breast/Nipple): Soft / non-tender     Hold (Positioning): Assistance needed to correctly position infant at breast and maintain latch. Intervention(s): Support Pillows  LATCH Score: 7  Lactation Tools Discussed/Used     Consult Status Consult Status: Follow-up Date: 05/26/16 Follow-up type: In-patient    Soyla Dryer 05/25/2016, 3:12 PM

## 2016-05-25 NOTE — MAU Note (Signed)
PT PRESENTS TO LOBBY - DOUBLED OVER COUNTER - REG - ASKED  CALLED.   TOOK PT TO RM 5 pilonidal cyst- IN [place].  SAYS GBS- NEG    WAS 2 IN CLINIC

## 2016-05-25 NOTE — Anesthesia Procedure Notes (Signed)
Epidural Patient location during procedure: OB Start time: 05/25/2016 7:01 AM End time: 05/25/2016 7:19 AM  Staffing Anesthesiologist: Anitra Lauth RAY Performed: anesthesiologist   Preanesthetic Checklist Completed: patient identified, site marked, surgical consent, pre-op evaluation, timeout performed, IV checked, risks and benefits discussed and monitors and equipment checked  Epidural Patient position: sitting Prep: DuraPrep Patient monitoring: heart rate, cardiac monitor, continuous pulse ox and blood pressure Approach: midline Location: L2-L3 Injection technique: LOR saline  Needle:  Needle type: Tuohy  Needle gauge: 17 G Needle length: 9 cm Needle insertion depth: 6 cm Catheter type: closed end flexible Catheter size: 20 Guage Catheter at skin depth: 9 cm Test dose: negative  Assessment Events: blood not aspirated, injection not painful, no injection resistance, negative IV test and no paresthesia  Additional Notes Reason for block:procedure for pain

## 2016-05-25 NOTE — Anesthesia Preprocedure Evaluation (Signed)
Anesthesia Evaluation  Patient identified by MRN, date of birth, ID band Patient awake    Reviewed: Allergy & Precautions, NPO status , Patient's Chart, lab work & pertinent test results  Airway Mallampati: II  TM Distance: >3 FB Neck ROM: Full    Dental no notable dental hx.    Pulmonary neg pulmonary ROS, former smoker,    Pulmonary exam normal breath sounds clear to auscultation       Cardiovascular negative cardio ROS Normal cardiovascular exam Rhythm:Regular Rate:Normal     Neuro/Psych negative neurological ROS  negative psych ROS   GI/Hepatic negative GI ROS, Neg liver ROS,   Endo/Other  negative endocrine ROS  Renal/GU negative Renal ROS  negative genitourinary   Musculoskeletal negative musculoskeletal ROS (+)   Abdominal   Peds negative pediatric ROS (+)  Hematology negative hematology ROS (+)   Anesthesia Other Findings   Reproductive/Obstetrics negative OB ROS (+) Pregnancy                             Anesthesia Physical Anesthesia Plan  ASA: II  Anesthesia Plan: Epidural   Post-op Pain Management:    Induction:   Airway Management Planned:   Additional Equipment:   Intra-op Plan:   Post-operative Plan:   Informed Consent: I have reviewed the patients History and Physical, chart, labs and discussed the procedure including the risks, benefits and alternatives for the proposed anesthesia with the patient or authorized representative who has indicated his/her understanding and acceptance.   Dental advisory given  Plan Discussed with: CRNA  Anesthesia Plan Comments:         Anesthesia Quick Evaluation

## 2016-05-25 NOTE — H&P (Signed)
OBSTETRIC ADMISSION HISTORY AND PHYSICAL  Lindsey Velazquez is a 26 y.o. female [phone] with IUP at 60w1dby 18 week [place] for SOL. She reports +FMs, No LOF, no VB, no blurry vision, headaches or peripheral edema, and RUQ pain.  She plans on breast/bottle feeding. She request Depo for birth control.  Dating: By 18 week [place]--->  Estimated Date of Delivery: 05/24/16  Prenatal History/Complications:  Past Medical History: Past Medical History:  Diagnosis Date  . Anxiety   . Asthma   . Depression   . Eczema     Past Surgical History: Past Surgical History:  Procedure Laterality Date  . NO PAST SURGERIES      Obstetrical History: OB History    Gravida Para Term Preterm AB Living   _0 SAB TAB Ectopic Multiple Live Births   1 0 0 0 3      Social History: Social History   Social History  . Marital status: Single    Spouse name: N/A  . Number of children: N/A  . Years of education: N/A   Social History Main Topics  . Smoking status: Former Smoker    Types: Cigarettes    Quit date: 02/27/2016  . Smokeless tobacco: Never Used  . Alcohol use Yes  . Drug use: No  . Sexual activity: Yes    Birth control/ protection: None   Other Topics Concern  . None   Social History Narrative  . None    Family History: Family History  Problem Relation Age of Onset  . Diabetes Mother   . Thyroid disease Sister     Allergies: Allergies  Allergen Reactions  . Clindamycin/Lincomycin Rash  . Penicillins Hives and Rash    Has patient had a PCN reaction causing immediate rash, facial/tongue/throat swelling, SOB or lightheadedness with hypotension: Yes Has patient had a PCN reaction causing severe rash involving mucus membranes or skin necrosis: No Has patient had a PCN reaction that required hospitalization No Has patient had a PCN reaction occurring within the last 10 years: No If all of the above answers are "NO", then may proceed with Cephalosporin use.      Prescriptions Prior to Admission  Medication Sig Dispense Refill Last Dose  . ferrous sulfate (FERROUSUL) 325 (65 FE) MG tablet Take 325 mg by mouth 2 (two) times daily with a meal.   04/27/2016 at Unknown time  . hydroxyprogesterone caproate (MAKENA) 250 mg/mL OIL injection Inject 250 mg into the muscle once a week.   04/27/2016 at Unknown time  . Prenatal Vit-Fe Fumarate-FA (PRENATAL COMPLETE) 14-0.4 MG TABS Take 1 tablet by mouth 2 (two) times daily. (Patient taking differently: Take 1 tablet by mouth daily. ) 60 each 2 04/27/2016 at Unknown time     Review of Systems   All systems reviewed and negative except as stated in HPI  Blood pressure (!) 115/51, pulse 81, temperature 98 F (36.7 C), temperature source Oral, resp. rate 18, height 5' (1.524 m), weight 68.9 kg (152 lb), SpO2 100 %. General appearance: alert, cooperative, appears stated age and no distress Lungs: clear to auscultation bilaterally Heart: regular rate and rhythm Abdomen: soft, non-tender; bowel sounds normal Pelvic: 66-7/59/-1and cephalic by nursing check Extremities: Homans sign is negative, no sign of DVT Presentation: cephalic by nursing check Fetal monitoringBaseline: 120 bpm Uterine activityFrequency: Every 2-3 minutes   Prenatal labs: ABO, Rh:  B+ Antibody: Negative (11/09 0000) Rubella: MMR x 2 RPR: Nonreactive (  11/09 0000)  HBsAg: Negative (11/09 0000)  HIV:   negative GBS:   not in chart, negative per patient 1 hr Glucola 89 Genetic screening  Quad screen neg Anatomy US wnl  Prenatal Transfer Tool  Maternal Diabetes: No Genetic Screening: Normal Maternal Ultrasounds/Referrals: Normal Fetal Ultrasounds or other Referrals:  None Maternal Substance Abuse:  Yes:  Type: Smoker Significant Maternal Medications:  None Significant Maternal Lab Results: None  Results for orders placed or performed during the hospital encounter of 05/25/16 (from the past 24 hour(s))  CBC   Collection Time:  05/25/16  6:31 AM  Result Value Ref Range   WBC 7.8 4.0 - 10.5 K/uL   RBC 3.31 (L) 3.87 - 5.11 MIL/uL   Hemoglobin 10.9 (L) 12.0 - 15.0 g/dL   HCT 30.8 (L) 36.0 - 46.0 %   MCV 93.1 78.0 - 100.0 fL   MCH 32.9 26.0 - 34.0 pg   MCHC 35.4 30.0 - 36.0 g/dL   RDW 14.7 11.5 - 15.5 %   Platelets 187 150 - 400 K/uL    Patient Active Problem List   Diagnosis Date Noted  . Normal labor 05/25/2016  . Indication for care in labor and delivery, antepartum 03/21/2016  . Neutropenia ([place]) 12/09/2013  . Contraception management 02/16/2013  . Rubella non-immune status, antepartum 09/27/2011    Assessment: Lindsey Velazquez is a 26 y.o. [phone] at 81w1dhere for SOL.  #Labor: expectant management, consider AROM #Pain: Epidural in place #FWB: Category 2 tracing #ID:  GBS negative, Rubella non-immune #MOF: breast/bottle #MOC: Depo #Circ:  outpatient  RFrancene Boyers MD 05/25/2016, 7:44 AM I have seen and examined this patient and agree with the management plan.

## 2016-05-25 NOTE — Plan of Care (Signed)
Problem: Education: Goal: Knowledge of condition will improve Admission paperwork reviewed with patient. Patient verbalizes understanding of information.  Problem: Nutritional: Goal: Mothers verbalization of comfort with breastfeeding process will improve Patient states she plans to breast and bottle feed. Discussed the importance of exclusive breast feeding until her milk supply comes in and baby has a good feeding pattern established. Discussed hand expression and feeding ques.

## 2016-05-25 NOTE — Progress Notes (Signed)
Additional RN called to attend delivery

## 2016-05-25 NOTE — Progress Notes (Signed)
RN to pt room to assess FHR, monitor alarming and reading "monitor malfunction" RN turned monitor off and back on, appears to functioning normally now. Charge RN notified of occurrence.

## 2016-05-25 NOTE — Progress Notes (Signed)
No current rubella status on record. Previous results from 2009 and 2013 result as immune. No rubella results noted in [facility] prenatal records. Ordered lab per Dr. Hillis Range; however lab draw was discontinued by Dr. Jolayne Panther. Called Dr. Jolayne Panther who states that she will further investigate rubella status and will order lab draw if needed. Earl Gala, [facility] [place]

## 2016-05-26 LAB — ABO/RH: ABO/RH(D): B POS

## 2016-05-26 LAB — RPR: RPR: NONREACTIVE

## 2016-05-26 NOTE — Progress Notes (Signed)
MOB was referred for history of depression/anxiety.  Referral is screened out by Clinical Social Worker because none of the following criteria appear to apply and there are no reports impacting the pregnancy or her transition to the postpartum period.  CSW does not deem it clinically necessary to further investigate at this time.   -History of anxiety/depression during this pregnancy, or of post-partum depression -Diagnosis of anxiety and/or depression within last 3 years -History of depression due to pregnancy loss/loss of child or -MOB's symptoms are currently being treated with medication and/or therapy  CSW completed chart review to include [facility] records. MOB was dx with depression/anxiety at age 1 (7 years ago). MOB does not have any reported symptoms since and is not currently on medications. Please contact the Clinical Social Worker if needs arise or upon MOB request.    Bubba Camp, MSW, LCSW-A Clinical Social Worker  [facility]  Office: [phone]

## 2016-05-26 NOTE — Progress Notes (Signed)
Post Partum Day #1 Subjective: no complaints, up ad lib and tolerating PO; bottlefeeding; desires Depo for contraception   Objective: Blood pressure 106/63, pulse 90, temperature 99.1 F (37.3 C), temperature source Oral, resp. rate 18, height 5' (1.524 m), weight 68.9 kg (152 lb), SpO2 100 %, unknown if currently breastfeeding.  Physical Exam:  General: alert, cooperative and no distress Lochia: appropriate Uterine Fundus: firm DVT Evaluation: No evidence of DVT seen on physical exam.   Recent Labs  05/25/16 0631  HGB 10.9*  HCT 30.8*    Assessment/Plan: Plan for discharge tomorrow   LOS: 1 day   Cam Hai CNM 05/26/2016, 9:23 AM

## 2016-05-26 NOTE — Anesthesia Postprocedure Evaluation (Signed)
Anesthesia Post Note  Patient: Lindsey Velazquez  Procedure(s) Performed: * No procedures listed *  Patient location during evaluation: Mother Baby Anesthesia Type: Epidural Level of consciousness: awake and alert Pain management: pain level controlled Vital Signs Assessment: post-procedure vital signs reviewed and stable Respiratory status: spontaneous breathing, nonlabored ventilation and respiratory function stable Cardiovascular status: stable Postop Assessment: no headache, no backache, epidural receding and patient able to bend at knees Anesthetic complications: no        Last Vitals:  Vitals:   05/25/16 1710 05/26/16 0015  BP: (!) 126/46 106/63  Pulse: 75 90  Resp: 18 18  Temp: 37 C 37.3 C    Last Pain:  Vitals:   05/26/16 0609  TempSrc:   PainSc: 3    Pain Goal: Patients Stated Pain Goal: 2 (05/25/16 1907)               [facility]

## 2016-05-27 DIAGNOSIS — Z3A40 40 weeks gestation of pregnancy: Secondary | ICD-10-CM

## 2016-05-27 MED ORDER — IBUPROFEN 600 MG PO TABS: 600.0000 mg | ORAL_TABLET | Freq: Four times a day (QID) | ORAL | 0 refills | Status: DC

## 2016-05-27 NOTE — Discharge Instructions (Signed)
Postpartum Care After Vaginal Delivery °The period of time right after you deliver your newborn is called the postpartum period. °What kind of medical care will I receive? °· You may continue to receive fluids and medicines through an IV tube inserted into one of your veins. °· If an incision was made near your vagina (episiotomy) or if you had some vaginal tearing during delivery, cold compresses may be placed on your episiotomy or your tear. This helps to reduce pain and swelling. °· You may be given a squirt bottle to use when you go to the bathroom. You may use this until you are comfortable wiping as usual. To use the squirt bottle, follow these steps: °¨ Before you urinate, fill the squirt bottle with warm water. Do not use hot water. °¨ After you urinate, while you are sitting on the toilet, use the squirt bottle to rinse the area around your urethra and vaginal opening. This rinses away any urine and blood. °¨ You may do this instead of wiping. As you start healing, you may use the squirt bottle before wiping yourself. Make sure to wipe gently. °¨ Fill the squirt bottle with clean water every time you use the bathroom. °· You will be given sanitary pads to wear. °How can I expect to feel? °· You may not feel the need to urinate for several hours after delivery. °· You will have some soreness and pain in your abdomen and vagina. °· If you are breastfeeding, you may have uterine contractions every time you breastfeed for up to several weeks postpartum. Uterine contractions help your uterus return to its normal size. °· It is normal to have vaginal bleeding (lochia) after delivery. The amount and appearance of lochia is often similar to a menstrual period in the first week after delivery. It will gradually decrease over the next few weeks to a dry, yellow-brown discharge. For most women, lochia stops completely by 6-8 weeks after delivery. Vaginal bleeding can vary from woman to woman. °· Within the first few  days after delivery, you may have breast engorgement. This is when your breasts feel heavy, full, and uncomfortable. Your breasts may also throb and feel hard, tightly stretched, warm, and tender. After this occurs, you may have milk leaking from your breasts. Your health care provider can help you relieve discomfort due to breast engorgement. Breast engorgement should go away within a few days. °· You may feel more sad or worried than normal due to hormonal changes after delivery. These feelings should not last more than a few days. If these feelings do not go away after several days, speak with your health care provider. °How should I care for myself? °· Tell your health care provider if you have pain or discomfort. °· Drink enough water to keep your urine clear or pale yellow. °· Wash your hands thoroughly with soap and water for at least 20 seconds after changing your sanitary pads, after using the toilet, and before holding or feeding your baby. °· If you are not breastfeeding, avoid touching your breasts a lot. Doing this can make your breasts produce more milk. °· If you become weak or lightheaded, or you feel like you might faint, ask for help before: °¨ Getting out of bed. °¨ Showering. °· Change your sanitary pads frequently. Watch for any changes in your flow, such as a sudden increase in volume, a change in color, the passing of large blood clots. If you pass a blood clot from your vagina, save it   to show to your health care provider. Do not flush blood clots down the toilet without having your health care provider look at them.  Make sure that all your vaccinations are up to date. This can help protect you and your baby from getting certain diseases. You may need to have immunizations done before you leave the hospital.  If desired, talk with your health care provider about methods of family planning or birth control (contraception). How can I start bonding with my baby? Spending as much time as  possible with your baby is very important. During this time, you and your baby can get to know each other and develop a bond. Having your baby stay with you in your room (rooming in) can give you time to get to know your baby. Rooming in can also help you become comfortable caring for your baby. Breastfeeding can also help you bond with your baby. How can I plan for returning home with my baby?  Make sure that you have a car seat installed in your vehicle.  Your car seat should be checked by a certified car seat installer to make sure that it is installed safely.  Make sure that your baby fits into the car seat safely.  Ask your health care provider any questions you have about caring for yourself or your baby. Make sure that you are able to contact your health care provider with any questions after leaving the hospital. This information is not intended to replace advice given to you by your health care provider. Make sure you discuss any questions you have with your health care provider. Document Released: 11/26/2006 Document Revised: 07/04/2015 Document Reviewed: 01/03/2015 Elsevier Interactive Patient Education  2017 [employer]    Pelvic Rest Pelvic rest may be recommended if:  Your placenta is partially or completely covering the opening of your cervix (placenta previa).  There is bleeding between the wall of the uterus and the amniotic sac in the first trimester of pregnancy (subchorionic hemorrhage).  You went into labor too early (preterm labor). Based on your overall health and the health of your baby, your health care provider will decide if pelvic rest is right for you. How do I rest my pelvis? For as long as told by your health care provider:  Do not have sex, sexual stimulation, or an orgasm.  Do not use tampons. Do not douche. Do not put anything in your vagina.  Do not lift anything that is heavier than 10 lb (4.5 kg).  Avoid activities that take a lot of effort  (are strenuous).  Avoid any activity in which your pelvic muscles could become strained. When should I seek medical care? Seek medical care if you have:  Cramping pain in your lower abdomen.  Vaginal discharge.  A low, dull backache.  Regular contractions.  Uterine tightening. When should I seek immediate medical care? Seek immediate medical care if:  You have vaginal bleeding and you are pregnant. This information is not intended to replace advice given to you by your health care provider. Make sure you discuss any questions you have with your health care provider. Document Released: 05/26/2010 Document Revised: 07/07/2015 Document Reviewed: 08/02/2014 Elsevier Interactive Patient Education  2017 [employer].

## 2016-05-27 NOTE — Progress Notes (Signed)
Patient refused to have VS taken this morning.

## 2016-12-10 LAB — HM HIV SCREENING LAB: HM HIV Screening: NEGATIVE

## 2017-04-13 ENCOUNTER — Encounter: Payer: Self-pay | Admitting: Emergency Medicine

## 2017-04-13 ENCOUNTER — Emergency Department
Admission: EM | Admit: 2017-04-13 | Discharge: 2017-04-13 | Disposition: A | Discharge status: Home / Self Care | Payer: Self-pay | Attending: Emergency Medicine | Admitting: Emergency Medicine

## 2017-04-13 ENCOUNTER — Other Ambulatory Visit: Payer: Self-pay

## 2017-04-13 DIAGNOSIS — Z87891 Personal history of nicotine dependence: Secondary | ICD-10-CM | POA: Insufficient documentation

## 2017-04-13 DIAGNOSIS — M25579 Pain in unspecified ankle and joints of unspecified foot: Secondary | ICD-10-CM | POA: Insufficient documentation

## 2017-04-13 DIAGNOSIS — J45909 Unspecified asthma, uncomplicated: Secondary | ICD-10-CM | POA: Insufficient documentation

## 2017-04-13 DIAGNOSIS — J069 Acute upper respiratory infection, unspecified: Secondary | ICD-10-CM | POA: Insufficient documentation

## 2017-04-13 DIAGNOSIS — Z79899 Other long term (current) drug therapy: Secondary | ICD-10-CM | POA: Insufficient documentation

## 2017-04-13 DIAGNOSIS — H10023 Other mucopurulent conjunctivitis, bilateral: Secondary | ICD-10-CM | POA: Insufficient documentation

## 2017-04-13 MED ORDER — NAPHAZOLINE HCL 0.1 % OP SOLN: 1.0000 [drp] | Freq: Four times a day (QID) | OPHTHALMIC | 0 refills | Status: DC | PRN

## 2017-04-13 MED ORDER — PSEUDOEPH-BROMPHEN-DM 30-2-10 MG/5ML PO SYRP: 5.0000 mL | ORAL_SOLUTION | Freq: Four times a day (QID) | ORAL | 0 refills | Status: DC | PRN

## 2017-04-13 MED ORDER — GENTAMICIN SULFATE 0.3 % OP SOLN: 1.0000 [drp] | OPHTHALMIC | 0 refills | Status: DC

## 2017-04-13 NOTE — ED Notes (Signed)
Pt discharged to home.  Family member driving.  Discharge instructions reviewed.  Verbalized understanding.  No questions or concerns at this time.  Teach back verified.  Pt in NAD.  No items left in ED.   

## 2017-04-13 NOTE — ED Provider Notes (Signed)
[facility] Emergency Department Provider Note   ____________________________________________   First MD Initiated Contact with Patient 04/13/17 1831     (approximate)  I have reviewed the triage vital signs and the nursing notes.   HISTORY  Chief Complaint Eye Problem    HPI Lindsey Velazquez is a 27 y.o. female patient presents with bilateral foot erythema and matted eyelids.  Onset of complaint 2 days ago.  Patient denies vision loss.  Patient denies use contact lenses.  Patient also complained of chest congestion and productive cough.  Patient rates pain as a 3/10.  No palliative measure for both complaints.   Past Medical History:  Diagnosis Date  . Anxiety   . Asthma   . Depression   . Eczema     Patient Active Problem List   Diagnosis Date Noted  . Normal labor 05/25/2016  . Indication for care in labor and delivery, antepartum 03/21/2016  . Neutropenia (HCC) 12/09/2013  . NSVD (normal spontaneous vaginal delivery) 02/29/2012    Class: Temporary    Past Surgical History:  Procedure Laterality Date  . NO PAST SURGERIES      Prior to Admission medications   Medication Sig Start Date End Date Taking? Authorizing Provider  brompheniramine-pseudoephedrine-DM 30-2-10 MG/5ML syrup Take 5 mLs by mouth 4 (four) times daily as needed. 04/13/17   Joni Reining, PA-C  gentamicin (GARAMYCIN) 0.3 % ophthalmic solution Place 1 drop into both eyes every 4 (four) hours. 04/13/17   Joni Reining, PA-C  ibuprofen (ADVIL,MOTRIN) 600 MG tablet Take 1 tablet (600 mg total) by mouth every 6 (six) hours. 05/27/16   Almon Hercules, MD  naphazoline (NAPHCON) 0.1 % ophthalmic solution Place 1 drop into both eyes 4 (four) times daily as needed for eye irritation. 04/13/17   Joni Reining, PA-C  Prenatal Vit-Fe Fumarate-FA (PRENATAL COMPLETE) 14-0.4 MG TABS Take 1 tablet by mouth 2 (two) times daily. Patient taking differently: Take 1 tablet by mouth daily.  10/23/15    Marlon Pel, PA-C    Allergies Clindamycin/lincomycin and Penicillins  Family History  Problem Relation Age of Onset  . Diabetes Mother   . Thyroid disease Sister     Social History Social History   Tobacco Use  . Smoking status: Former Smoker    Types: Cigarettes    Last attempt to quit: 02/27/2016    Years since quitting: 1.1  . Smokeless tobacco: Never Used  Substance Use Topics  . Alcohol use: Yes  . Drug use: No    Review of Systems Constitutional: No fever/chills Eyes: No visual changes.  Edema matted eyelids.  ENT: No sore throat. Cardiovascular: Denies chest pain. Respiratory: Denies shortness of breath.  Nonproductive cough. Gastrointestinal: No abdominal pain.  No nausea, no vomiting.  No diarrhea.  No constipation. Genitourinary: Negative for dysuria. Musculoskeletal: Negative for back pain. Skin: Negative for rash. Neurological: Negative for headaches, focal weakness or numbness. Psychiatric:Anxiety depression Allergic/Immunilogical: Clindamycin and penicillin. ____________________________________________   PHYSICAL EXAM:  VITAL SIGNS: ED Triage Vitals [04/13/17 1757]  Enc Vitals Group     BP (!) 106/53     Pulse Rate 96     Resp 18     Temp 98.2 F (36.8 C)     Temp Source Oral     SpO2 100 %     Weight 126 lb (57.2 kg)     Height 5\' 1"  (1.549 m)     Head Circumference      Peak  Flow      Pain Score 3     Pain Loc      Pain Edu?      Excl. in GC?    Constitutional: Alert and oriented. Well appearing and in no acute distress. Eyes: Conjunctivae are erythematous l. PERRL. EOMI. dry matted secretion bilateral eyelids Head: Atraumatic. Nose: No congestion/rhinnorhea. Mouth/Throat: Mucous membranes are moist.  Oropharynx non-erythematous. Neck: No stridor.  Hematological/Lymphatic/Immunilogical: No cervical lymphadenopathy. Cardiovascular: Normal rate, regular rhythm. Grossly normal heart sounds.  Good peripheral  circulation. Respiratory: Normal respiratory effort.  No retractions. Lungs CTAB. Skin:  Skin is warm, dry and intact. No rash noted. Psychiatric: Mood and affect are normal. Speech and behavior are normal.  ____________________________________________   LABS (all labs ordered are listed, but only abnormal results are displayed)  Labs Reviewed - No data to display ____________________________________________  EKG   ____________________________________________  RADIOLOGY  ED MD interpretation:    Official radiology report(s): No results found.  ____________________________________________   PROCEDURES  Procedure(s) performed:   Procedures  Critical Care performed:   ____________________________________________   INITIAL IMPRESSION / ASSESSMENT AND PLAN / ED COURSE  As part of my medical decision making, I reviewed the following data within the electronic MEDICAL RECORD NUMBER    Conjunctivitis respiratory infection.  Patient given discharge care instruction.  Patient advised take medication as directed and [facility]      ____________________________________________   FINAL CLINICAL IMPRESSION(S) / ED DIAGNOSES  Final diagnoses:  Other mucopurulent conjunctivitis of both eyes  Upper respiratory infection, viral     ED Discharge Orders        Ordered    gentamicin (GARAMYCIN) 0.3 % ophthalmic solution  Every 4 hours     04/13/17 1833    naphazoline (NAPHCON) 0.1 % ophthalmic solution  4 times daily PRN     04/13/17 1833    brompheniramine-pseudoephedrine-DM 30-2-10 MG/5ML syrup  4 times daily PRN     04/13/17 1833       Note:  This document was prepared using Dragon voice recognition software and may include unintentional dictation errors.    Joni ReiningSmith, Tyshaun Vinzant K, PA-C 04/13/17 1839    Phineas SemenGoodman, Graydon, MD 04/13/17 2004

## 2017-04-13 NOTE — ED Notes (Signed)
Bilateral eye itching, redness and drainage.  Pt is A&Ox4, in NAD.

## 2017-04-13 NOTE — ED Triage Notes (Signed)
Bilateral eye redness x 2 days
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
## 2017-05-23 ENCOUNTER — Emergency Department
Admission: EM | Admit: 2017-05-23 | Discharge: 2017-05-23 | Disposition: A | Discharge status: Home / Self Care | Payer: Self-pay | Attending: Emergency Medicine | Admitting: Emergency Medicine

## 2017-05-23 ENCOUNTER — Other Ambulatory Visit: Payer: Self-pay

## 2017-05-23 ENCOUNTER — Encounter: Payer: Self-pay | Admitting: Emergency Medicine

## 2017-05-23 DIAGNOSIS — J45909 Unspecified asthma, uncomplicated: Secondary | ICD-10-CM | POA: Insufficient documentation

## 2017-05-23 DIAGNOSIS — J020 Streptococcal pharyngitis: Secondary | ICD-10-CM | POA: Insufficient documentation

## 2017-05-23 DIAGNOSIS — Z79899 Other long term (current) drug therapy: Secondary | ICD-10-CM | POA: Insufficient documentation

## 2017-05-23 DIAGNOSIS — Z87891 Personal history of nicotine dependence: Secondary | ICD-10-CM | POA: Insufficient documentation

## 2017-05-23 MED ORDER — AMOXICILLIN 500 MG PO CAPS
1000.0000 mg | ORAL_CAPSULE | Freq: Once | ORAL | Status: AC
  Administered 2017-05-23: 1000 mg via ORAL
  Filled 2017-05-23: qty 2

## 2017-05-23 MED ORDER — AMOXICILLIN 875 MG PO TABS: 875.0000 mg | ORAL_TABLET | Freq: Two times a day (BID) | ORAL | 0 refills | Status: DC

## 2017-05-23 MED ORDER — IBUPROFEN 600 MG PO TABS
600.0000 mg | ORAL_TABLET | Freq: Once | ORAL | Status: AC
  Administered 2017-05-23: 600 mg via ORAL
  Filled 2017-05-23: qty 1

## 2017-05-23 MED ORDER — DEXAMETHASONE 1 MG/ML PO CONC
10.0000 mg | Freq: Once | ORAL | Status: AC
  Administered 2017-05-23: 10 mg via ORAL
  Filled 2017-05-23: qty 10

## 2017-05-23 NOTE — ED Triage Notes (Signed)
Here for sore throat.  Uvula and tonsils swollen, tonsils touching uvula.  Trouble swallowing secretions per pt. Difficulty talking.

## 2017-05-23 NOTE — ED Provider Notes (Signed)
[facility] Emergency Department Provider Note  ___________________________________________   First MD Initiated Contact with Patient 05/23/17 1724     (approximate)  I have reviewed the triage vital signs and the nursing notes.   HISTORY  Chief Complaint Sore Throat   HPI Lindsey Velazquez is a 27 y.o. female with a history of depression as well as asthma who is presenting the hospital with a sore throat over the past 24 hours.  She says that she has had difficulty swallowing.  Denies cough.  Says that she has had fever.  Denies runny nose.  History of strep but remotely. known sick contacts of both of her younger children.  Past Medical History:  Diagnosis Date  . Anxiety   . Asthma   . Depression   . Eczema     Patient Active Problem List   Diagnosis Date Noted  . Normal labor 05/25/2016  . Indication for care in labor and delivery, antepartum 03/21/2016  . Neutropenia (HCC) 12/09/2013  . NSVD (normal spontaneous vaginal delivery) 02/29/2012    Class: Temporary    Past Surgical History:  Procedure Laterality Date  . NO PAST SURGERIES      Prior to Admission medications   Medication Sig Start Date End Date Taking? Authorizing Provider  brompheniramine-pseudoephedrine-DM 30-2-10 MG/5ML syrup Take 5 mLs by mouth 4 (four) times daily as needed. 04/13/17   Joni Reining, PA-C  gentamicin (GARAMYCIN) 0.3 % ophthalmic solution Place 1 drop into both eyes every 4 (four) hours. 04/13/17   Joni Reining, PA-C  ibuprofen (ADVIL,MOTRIN) 600 MG tablet Take 1 tablet (600 mg total) by mouth every 6 (six) hours. 05/27/16   Almon Hercules, MD  naphazoline (NAPHCON) 0.1 % ophthalmic solution Place 1 drop into both eyes 4 (four) times daily as needed for eye irritation. 04/13/17   Joni Reining, PA-C  Prenatal Vit-Fe Fumarate-FA (PRENATAL COMPLETE) 14-0.4 MG TABS Take 1 tablet by mouth 2 (two) times daily. Patient taking differently: Take 1 tablet by mouth daily.   10/23/15   Marlon Pel, PA-C    Allergies Clindamycin/lincomycin and Penicillins  Family History  Problem Relation Age of Onset  . Diabetes Mother   . Thyroid disease Sister     Social History Social History   Tobacco Use  . Smoking status: Former Smoker    Types: Cigarettes    Last attempt to quit: 02/27/2016    Years since quitting: 1.2  . Smokeless tobacco: Never Used  Substance Use Topics  . Alcohol use: Yes  . Drug use: No    Review of Systems  Constitutional: Fever Eyes: No visual changes. ENT: As above Cardiovascular: Denies chest pain. Respiratory: Denies shortness of breath. Gastrointestinal: No abdominal pain.  No nausea, no vomiting.  No diarrhea.  No constipation. Genitourinary: Negative for dysuria. Musculoskeletal: Negative for back pain. Skin: Negative for rash. Neurological: Negative for headaches, focal weakness or numbness.   ____________________________________________   PHYSICAL EXAM:  VITAL SIGNS: ED Triage Vitals [05/23/17 1705]  Enc Vitals Group     BP 131/82     Pulse Rate (!) 137     Resp 20     Temp (!) 100.4 F (38 C)     Temp Source Oral     SpO2 100 %     Weight 125 lb (56.7 kg)     Height      Head Circumference      Peak Flow      Pain Score  10     Pain Loc      Pain Edu?      Excl. in GC?     Constitutional: Alert and oriented.  Slightly muffled voice but controlling her secretions. Eyes: Conjunctivae are normal.  Head: Atraumatic. Nose: No congestion/rhinnorhea. Mouth/Throat: Mucous membranes are moist.  Erythematous pharynx with bilaterally swollen tonsils with right-sided exudate.  Uvula is not deviated.  No obvious peritonsillar swelling. Neck: No stridor.  Bilateral anterior cervical lymphadenopathy which is swollen and tender to palpation. Cardiovascular: Tachycardic, regular rhythm. Grossly normal heart sounds.   Respiratory: Normal respiratory effort.  No retractions. Lungs CTAB. Gastrointestinal: Soft  and nontender. No distention.  Musculoskeletal: No lower extremity tenderness nor edema.   Neurologic:  Normal speech and language. No gross focal neurologic deficits are appreciated. Skin:  Skin is warm, dry and intact. No rash noted. Psychiatric: Mood and affect are normal. Speech and behavior are normal.  ____________________________________________   LABS (all labs ordered are listed, but only abnormal results are displayed)  Labs Reviewed - No data to display ____________________________________________  EKG   ____________________________________________  RADIOLOGY   ____________________________________________   PROCEDURES  Procedure(s) performed:   Procedures  Critical Care performed:   ____________________________________________   INITIAL IMPRESSION / ASSESSMENT AND PLAN / ED COURSE  Pertinent labs & imaging results that were available during my care of the patient were reviewed by me and considered in my medical decision making (see chart for details).  DDX: Mononucleosis, strep throat, URI, peritonsillar abscess As part of my medical decision making, I reviewed the following data within the electronic MEDICAL RECORD NUMBER Notes from prior ED visits  Centor score of 4 out of 4.  Will treat empirically for strep throat.    ----------------------------------------- 7:31 PM on 05/23/2017 -----------------------------------------  Patient feeling improved.  Heart rate as well as temperature have returned to normal values.  Will discharge at this time. ____________________________________________   FINAL CLINICAL IMPRESSION(S) / ED DIAGNOSES  Strep pharyngitis    NEW MEDICATIONS STARTED DURING THIS VISIT:  New Prescriptions   No medications on file     Note:  This document was prepared using Dragon voice recognition software and may include unintentional dictation errors.     Myrna BlazerSchaevitz, Jolonda Gomm Matthew, MD 05/23/17 [phone]

## 2017-05-23 NOTE — ED Notes (Signed)
Reviewed discharge instructions, follow-up care, and prescriptions with patient. Patient verbalized understanding of all information reviewed. Patient stable, with no distress noted at this time.    

## 2017-10-10 ENCOUNTER — Other Ambulatory Visit: Payer: Self-pay | Admitting: Advanced Practice Midwife

## 2017-10-10 ENCOUNTER — Other Ambulatory Visit: Payer: Self-pay | Admitting: Oncology

## 2017-10-10 ENCOUNTER — Other Ambulatory Visit: Payer: Self-pay | Admitting: Urology

## 2017-10-10 ENCOUNTER — Other Ambulatory Visit: Payer: Self-pay | Admitting: Nephrology

## 2017-10-10 ENCOUNTER — Other Ambulatory Visit: Payer: Self-pay | Admitting: Family Medicine

## 2017-10-10 DIAGNOSIS — Z369 Encounter for antenatal screening, unspecified: Secondary | ICD-10-CM

## 2017-10-11 DIAGNOSIS — Z2839 Other underimmunization status: Secondary | ICD-10-CM | POA: Insufficient documentation

## 2017-10-15 ENCOUNTER — Other Ambulatory Visit: Payer: Self-pay | Admitting: Obstetrics & Gynecology

## 2017-10-15 ENCOUNTER — Other Ambulatory Visit (INDEPENDENT_AMBULATORY_CARE_PROVIDER_SITE_OTHER): Payer: Medicaid Other

## 2017-10-15 DIAGNOSIS — O021 Missed abortion: Secondary | ICD-10-CM

## 2017-10-15 DIAGNOSIS — Z3689 Encounter for other specified antenatal screening: Secondary | ICD-10-CM

## 2017-10-16 ENCOUNTER — Telehealth: Payer: Self-pay | Admitting: Obstetrics and Gynecology

## 2017-10-16 NOTE — Telephone Encounter (Signed)
[facility] referring for Missed abortion patient was to have D&C. Called and left vociemail for patient to call back to be schedule with Dr. Jerene Pitch.( Paper record)

## 2017-10-17 ENCOUNTER — Other Ambulatory Visit: Payer: Self-pay | Admitting: Obstetrics and Gynecology

## 2017-10-17 ENCOUNTER — Ambulatory Visit: Payer: Medicaid Other | Admitting: Obstetrics & Gynecology

## 2017-10-17 DIAGNOSIS — Z369 Encounter for antenatal screening, unspecified: Secondary | ICD-10-CM

## 2017-10-18 ENCOUNTER — Ambulatory Visit: Payer: Medicaid Other | Admitting: Obstetrics & Gynecology

## 2017-10-21 ENCOUNTER — Ambulatory Visit: Payer: Self-pay

## 2017-10-21 ENCOUNTER — Ambulatory Visit: Payer: Medicaid Other | Admitting: Obstetrics & Gynecology

## 2017-10-21 ENCOUNTER — Other Ambulatory Visit: Payer: Self-pay | Admitting: Obstetrics & Gynecology

## 2017-10-21 NOTE — Telephone Encounter (Signed)
Spoke with Fonda at [facility] patient dismissed due to 3 no show schedule appt

## 2017-10-21 NOTE — Progress Notes (Signed)
Missed appt x3 (no shows) Dismissed from practice As we have never seen her (referral), no follow up arranged [facility] to cont to care for pt.

## 2017-10-24 ENCOUNTER — Emergency Department: Payer: Medicaid Other

## 2017-10-24 ENCOUNTER — Emergency Department
Admission: EM | Admit: 2017-10-24 | Discharge: 2017-10-24 | Disposition: A | Discharge status: Home / Self Care | Payer: Medicaid Other | Attending: Emergency Medicine | Admitting: Emergency Medicine

## 2017-10-24 DIAGNOSIS — O26851 Spotting complicating pregnancy, first trimester: Secondary | ICD-10-CM | POA: Diagnosis not present

## 2017-10-24 DIAGNOSIS — O034 Incomplete spontaneous abortion without complication: Secondary | ICD-10-CM | POA: Diagnosis not present

## 2017-10-24 DIAGNOSIS — R102 Pelvic and perineal pain: Secondary | ICD-10-CM | POA: Insufficient documentation

## 2017-10-24 DIAGNOSIS — O99511 Diseases of the respiratory system complicating pregnancy, first trimester: Secondary | ICD-10-CM | POA: Diagnosis not present

## 2017-10-24 DIAGNOSIS — Z87891 Personal history of nicotine dependence: Secondary | ICD-10-CM | POA: Diagnosis not present

## 2017-10-24 DIAGNOSIS — O208 Other hemorrhage in early pregnancy: Secondary | ICD-10-CM

## 2017-10-24 DIAGNOSIS — O209 Hemorrhage in early pregnancy, unspecified: Secondary | ICD-10-CM

## 2017-10-24 DIAGNOSIS — Z79899 Other long term (current) drug therapy: Secondary | ICD-10-CM | POA: Diagnosis not present

## 2017-10-24 DIAGNOSIS — Z3A12 12 weeks gestation of pregnancy: Secondary | ICD-10-CM | POA: Diagnosis not present

## 2017-10-24 DIAGNOSIS — O9989 Other specified diseases and conditions complicating pregnancy, childbirth and the puerperium: Secondary | ICD-10-CM | POA: Diagnosis present

## 2017-10-24 LAB — CBC WITH DIFFERENTIAL/PLATELET
BASOS PCT: 1 %
Basophils Absolute: 0 10*3/uL (ref 0–0.1)
EOS ABS: 0 10*3/uL (ref 0–0.7)
EOS PCT: 0 %
HEMATOCRIT: 24.9 % — AB (ref 35.0–47.0)
Hemoglobin: 8.7 g/dL — ABNORMAL LOW (ref 12.0–16.0)
Lymphocytes Relative: 22 %
Lymphs Abs: 1.4 10*3/uL (ref 1.0–3.6)
MCH: 34 pg (ref 26.0–34.0)
MCHC: 34.9 g/dL (ref 32.0–36.0)
MCV: 97.3 fL (ref 80.0–100.0)
MONO ABS: 0.3 10*3/uL (ref 0.2–0.9)
MONOS PCT: 5 %
Neutro Abs: 4.4 10*3/uL (ref 1.4–6.5)
Neutrophils Relative %: 72 %
PLATELETS: 206 10*3/uL (ref 150–440)
RBC: 2.56 MIL/uL — ABNORMAL LOW (ref 3.80–5.20)
RDW: 14.1 % (ref 11.5–14.5)
WBC: 6.2 10*3/uL (ref 3.6–11.0)

## 2017-10-24 LAB — BASIC METABOLIC PANEL
Anion gap: 6 (ref 5–15)
BUN: 7 mg/dL (ref 6–20)
CO2: 22 mmol/L (ref 22–32)
CREATININE: 0.51 mg/dL (ref 0.44–1.00)
Calcium: 8.4 mg/dL — ABNORMAL LOW (ref 8.9–10.3)
Chloride: 108 mmol/L (ref 98–111)
GFR calc Af Amer: >60 mL/min (ref >60)
GLUCOSE: 121 mg/dL — AB (ref 70–99)
Potassium: 3.2 mmol/L — ABNORMAL LOW (ref 3.5–5.1)
Sodium: 136 mmol/L (ref 135–145)

## 2017-10-24 LAB — HCG, QUANTITATIVE, PREGNANCY: HCG, BETA CHAIN, QUANT, S: 4995 m[IU]/mL — AB (ref <5)

## 2017-10-24 MED ORDER — SODIUM CHLORIDE 0.9 % IV BOLUS
1000.0000 mL | Freq: Once | INTRAVENOUS | Status: AC
  Administered 2017-10-24: 1000 mL via INTRAVENOUS

## 2017-10-24 MED ORDER — ACETAMINOPHEN 500 MG PO TABS
1000.0000 mg | ORAL_TABLET | Freq: Once | ORAL | Status: AC
  Administered 2017-10-24: 1000 mg via ORAL
  Filled 2017-10-24: qty 2

## 2017-10-24 NOTE — ED Notes (Signed)
[facility] social worker, Minerva Areolaric, contacted to see if there are any available resources to help patient out so she can have procedure done.

## 2017-10-24 NOTE — Clinical Social Work Note (Addendum)
CSW was called by Herbert SetaHeather bedside nurse regarding patient not having any care givers for her 3 kids who will be getting home from school and her child in the room.  Physician is recommending that patient has an emergent need for D&C per Dr. Merlene PullingLord's note.  Patient expressed that she does not have any one who can take care of her kids and needs procedure, CSW suggested to bedside nurse that she makes a CPS report.  This CSW attempted to make a [facility] C[facility] report as well, however message had to be left awaiting for call back from [facility] C[facility].  Per physician note patient is leaving and signed an AMA form.    5:00pm  CSW spoke to bedside nurse Beverly MilchHeather Fisher and she informed CSW that she made a CPS report.  5:15pm CSW received phone call back from [facility], CSW made report to CPS, they will review information that was given.  Ervin KnackEric R. Haakon Titsworth, MSW, Theresia MajorsLCSWA [phone]  10/24/2017 4:53 PM

## 2017-10-24 NOTE — ED Notes (Addendum)
Pt made aware of importance of being seen by surgeon in order to have procedure done to stop the vaginal bleeding. Pt states that she has no one to pick up her 3 children that will be getting off of the after school bus at 5pm but she does understand the importance of being seen. CPS was contacted in an effort to find a temporary solution for childcare. CPS recommended temporary foster placement to which the pt did not agree with. EDP made aware and informed pt that if symptoms get any worse then pt should return to ED to be evaluated, otherwise pt needs to follow up with OBGYN. Pt agreeable to plan and to sign AMA paperwork.

## 2017-10-24 NOTE — ED Provider Notes (Signed)
[facility] Emergency Department Provider Note ____________________________________________   I have reviewed the triage vital signs and the triage nursing note.  HISTORY  Chief Complaint Vaginal Bleeding   Historian Patient  HPI Lindsey Velazquez is a 27 y.o. female G6 P4 A1 presents to the ED for evaluation of bleeding after taking "medication for miscarriage.  "  It seems that she believes she was approximately [redacted] weeks pregnant by last menstrual period in June, and states that she had an ultrasound that showed miscarriage and was given 3 pills to take on Saturday from the health department and states that she had some spotting until last night.  Overnight she had significant lower abdominal cramping that she felt like she was "in labor.  "And she is passing clots.  No dizziness passing out.  Currently symptoms have subsided.  Pain is better and seems to have slowed down bleeding.  Nothing makes it worse or better.    Past Medical History:  Diagnosis Date  . Anxiety   . Asthma    childhood  . Depression   . Eczema     Patient Active Problem List   Diagnosis Date Noted  . Normal labor 05/25/2016  . Indication for care in labor and delivery, antepartum 03/21/2016  . Neutropenia (HCC) 12/09/2013  . NSVD (normal spontaneous vaginal delivery) 02/29/2012    Class: Temporary    Past Surgical History:  Procedure Laterality Date  . NO PAST SURGERIES      Prior to Admission medications   Medication Sig Start Date End Date Taking? Authorizing Provider  amoxicillin (AMOXIL) 875 MG tablet Take 1 tablet (875 mg total) by mouth 2 (two) times daily. 05/23/17   Myrna Blazer, MD  brompheniramine-pseudoephedrine-DM 30-2-10 MG/5ML syrup Take 5 mLs by mouth 4 (four) times daily as needed. 04/13/17   Joni Reining, PA-C  gentamicin (GARAMYCIN) 0.3 % ophthalmic solution Place 1 drop into both eyes every 4 (four) hours. 04/13/17   Joni Reining, PA-C   ibuprofen (ADVIL,MOTRIN) 600 MG tablet Take 1 tablet (600 mg total) by mouth every 6 (six) hours. 05/27/16   Almon Hercules, MD  naphazoline (NAPHCON) 0.1 % ophthalmic solution Place 1 drop into both eyes 4 (four) times daily as needed for eye irritation. 04/13/17   Joni Reining, PA-C  Prenatal Vit-Fe Fumarate-FA (PRENATAL COMPLETE) 14-0.4 MG TABS Take 1 tablet by mouth 2 (two) times daily. Patient taking differently: Take 1 tablet by mouth daily.  10/23/15   Marlon Pel, PA-C    Allergies  Allergen Reactions  . Clindamycin/Lincomycin Rash  . Penicillins Hives and Rash    Has patient had a PCN reaction causing immediate rash, facial/tongue/throat swelling, SOB or lightheadedness with hypotension: Yes Has patient had a PCN reaction causing severe rash involving mucus membranes or skin necrosis: No Has patient had a PCN reaction that required hospitalization No Has patient had a PCN reaction occurring within the last 10 years: No If all of the above answers are "NO", then may proceed with Cephalosporin use.     Family History  Problem Relation Age of Onset  . Diabetes Mother   . Thyroid disease Sister     Social History Social History   Tobacco Use  . Smoking status: Former Smoker    Packs/day: 0.50    Types: Cigarettes  . Smokeless tobacco: Never Used  Substance Use Topics  . Alcohol use: Yes  . Drug use: No    Review of Systems  Constitutional:  Negative for fever. Eyes: Negative for visual changes. ENT: Negative for sore throat. Cardiovascular: Negative for chest pain. Respiratory: Negative for shortness of breath. Gastrointestinal: Negative for vomiting. Genitourinary: Negative for dysuria. Musculoskeletal: Negative for back pain. Skin: Negative for rash. Neurological: Negative for headache.  ____________________________________________   PHYSICAL EXAM:  VITAL SIGNS: ED Triage Vitals  Enc Vitals Group     BP 10/24/17 1025 104/83     Pulse Rate 10/24/17  1025 81     Resp 10/24/17 1025 18     Temp 10/24/17 1025 98.7 F (37.1 C)     Temp Source 10/24/17 1025 Oral     SpO2 10/24/17 1025 100 %     Weight 10/24/17 1026 124 lb (56.2 kg)     Height --      Head Circumference --      Peak Flow --      Pain Score 10/24/17 1025 6     Pain Loc --      Pain Edu? --      Excl. in GC? --      Constitutional: Alert and oriented.  HEENT      Head: Normocephalic and atraumatic.      Eyes: Conjunctivae are normal. Pupils equal and round.       Ears:         Nose: No congestion/rhinnorhea.      Mouth/Throat: Mucous membranes are moist.      Neck: No stridor. Cardiovascular/Chest: Normal rate, regular rhythm.  No murmurs, rubs, or gallops. Respiratory: Normal respiratory effort without tachypnea nor retractions. Breath sounds are clear and equal bilaterally. No wheezes/rales/rhonchi. Gastrointestinal: Soft. No distention, no guarding, no rebound. Nontender.  Genitourinary/rectal:Deferred Musculoskeletal: Nontender with normal range of motion in all extremities. No joint effusions.  No lower extremity tenderness.  No edema. Neurologic:  Normal speech and language. No gross or focal neurologic deficits are appreciated. Skin:  Skin is warm, dry and intact. No rash noted. Psychiatric: Mood and affect are normal. Speech and behavior are normal. Patient exhibits appropriate insight and judgment.   ____________________________________________  LABS (pertinent positives/negatives) I, Governor Rooksebecca Sharron Petruska, MD the attending physician have reviewed the labs noted below.  Labs Reviewed  BASIC METABOLIC PANEL - Abnormal; Notable for the following components:      Result Value   Potassium 3.2 (*)    Glucose, Bld 121 (*)    Calcium 8.4 (*)    All other components within normal limits  CBC WITH DIFFERENTIAL/PLATELET - Abnormal; Notable for the following components:   RBC 2.56 (*)    Hemoglobin 8.7 (*)    HCT 24.9 (*)    All other components within normal  limits  HCG, QUANTITATIVE, PREGNANCY - Abnormal; Notable for the following components:   hCG, Beta Chain, Quant, S 4,995 (*)    All other components within normal limits    ____________________________________________    EKG I, Governor Rooksebecca Donata Reddick, MD, the attending physician have personally viewed and interpreted all ECGs.  None ____________________________________________  RADIOLOGY   Transvaginal ultrasound radiologist report reviewed: IMPRESSION: Elongated hypoechoic structure within the lower uterine segment may represent abnormally appearing vacant intrauterine gestational sac versus fluid collection.  Thickened heterogeneous endometrium. Retained placental products cannot be excluded with this appearance.  Normal appearance of the ovaries. __________________________________________  PROCEDURES  Procedure(s) performed: None  Procedures  Critical Care performed: None   ____________________________________________  ED COURSE / ASSESSMENT AND PLAN  Pertinent labs & imaging results that were available during my care of the  patient were reviewed by me and considered in my medical decision making (see chart for details).   I reviewed the scanned documents from [facility] that showed blood type B+ and the last menstrual period in June.  I saw a note from Dr. Tiburcio Pea that discharge patient from the practice given 3 no-shows.  There is why she is getting her care at the health department.  Clinically she does not appear to be having additional active hemorrhage.  She declined pelvic exam and I do not think it is likely to have brought any additional clinical information given that she felt like the bleeding had decreased and she is not having any new ongoing pain.  Ultrasound shows continued thickened endometrium and possibly of retained products.  Discussed with OB/GYN Tiburcio Pea initially, prior to doing pelvic exam, had talked about possibility of outpatient management,  patient will need a different OB/GYN since he is been discharged from his practice due to no show.  Patient completed being dizzy when she stood up, and I did do a pelvic exam after patient agreed to that, and she was passing clots, and had a slightly open cervix.  Spoke with OR nurse of Dr. Tiburcio Pea about coming to see the patient about D&C today after patient stated she would like to go ahead with this.  Few minutes later, patient stated that she wanted to leave.  She was up walking around the room without apparent dizziness.  She is not hypotension here.  She does understand the risk of worsening including death, and I have asked her to sign AGAINST MEDICAL ADVICE.    CONSULTATIONS:  Ob gyn Dr. Tiburcio Pea about possibility of emergent need for D&C.   Patient / Family / Caregiver informed of clinical course, medical decision-making process, and agree with plan.   I discussed return precautions, follow-up instructions, and discharge instructions with patient and/or family.  Discharge Instructions :  We discussed the recommendation to stay in the hospital for procedure called D&C given dizziness with vaginal bleeding and the ongoing miscarriage, however you decided to go home today.  Return to emergency department immediately for any worsening bleeding or hemorrhage, including passing more blood than changing 1 pad per hour for 2 or 3 hours, dizziness or passing out, no worsening uncontrolled abdominal pain, vision changes, headache, nausea, vomiting, or any other symptoms concerning to you.  Return to the emergency department immediately for any worsening condition including new or worsening or uncontrolled abdominal pain, back pain, or any concern about worsening bleeding including soaking through a pad once per hour for 3 hours in a row, or any dizziness or passing out.     ___________________________________________   FINAL CLINICAL IMPRESSION(S) / ED DIAGNOSES   Final diagnoses:   Incomplete miscarriage      ___________________________________________         Note: This dictation was prepared with Dragon dictation. Any transcriptional errors that result from this process are unintentional    Governor Rooks, MD 10/24/17 [phone]

## 2017-10-24 NOTE — ED Notes (Signed)
Pt given a meal tray 

## 2017-10-24 NOTE — ED Notes (Addendum)
In to do pelvic exam with MD, pt informed that she will need D/C done in OR this afternoon. Pt has small child present with her in ED. Pt was asked what her plans were to do with the child while she was going to have the procedure done, pt reports that she does not have a plan of what to do with her child and that she has 3 other children in aftercare program at school that will be getting home soon and no one to meet them at home. Pt requested that I call her Child psychotherapistsocial worker. Social worker had no suggestions. Pt reports that she will leave and follow up with OB GYN asap.

## 2017-10-24 NOTE — Discharge Instructions (Addendum)
We discussed the recommendation to stay in the hospital for procedure called D&C given dizziness with vaginal bleeding and the ongoing miscarriage, however you decided to go home today.  Return to emergency department immediately for any worsening bleeding or hemorrhage, including passing more blood than changing 1 pad per hour for 2 or 3 hours, dizziness or passing out, no worsening uncontrolled abdominal pain, vision changes, headache, nausea, vomiting, or any other symptoms concerning to you.  Return to the emergency department immediately for any worsening condition including new or worsening or uncontrolled abdominal pain, back pain, or any concern about worsening bleeding including soaking through a pad once per hour for 3 hours in a row, or any dizziness or passing out.

## 2017-10-24 NOTE — ED Triage Notes (Signed)
Pt came to Ed via EMS. [facility] week due to doctor telling her there was no heartbeat. Pt was [redacted] weeks pregnant. Reports she was given a pill to take, took it on Saturday. Started having spotting and cramping. Last night, pain got worse. Pt reports feeling dizzy and weak and passing clots.

## 2017-10-28 ENCOUNTER — Telehealth: Payer: Self-pay | Admitting: Emergency Medicine

## 2017-10-28 NOTE — Telephone Encounter (Signed)
Called patient to inform her that she was referred to wrong practice for ob/gyn as they were not on call.  Was going to give her correct number for follow up, but patient says she has passed more vaginally.  She says she has slowed down on bleeding, she has been considering coming in since yesterday but could not due to child care issues.  Says she cannot do anything and feels like she is going to pass out when she stands up.  She is worried about coming because she has a 29 year old with her.  She says she has no one to care for her child and no family in the area.  I told her that she should certainly return as needed, regardless.

## 2017-10-29 ENCOUNTER — Encounter: Payer: Medicaid Other | Admitting: Obstetrics and Gynecology

## 2017-10-30 ENCOUNTER — Emergency Department: Payer: Medicaid Other

## 2017-10-30 ENCOUNTER — Observation Stay
Admission: EM | Admit: 2017-10-30 | Discharge: 2017-10-31 | Disposition: A | Discharge status: Home / Self Care | Payer: Medicaid Other | Attending: Obstetrics and Gynecology | Admitting: Obstetrics and Gynecology

## 2017-10-30 ENCOUNTER — Encounter: Payer: Self-pay | Admitting: Intensive Care

## 2017-10-30 ENCOUNTER — Other Ambulatory Visit: Payer: Self-pay

## 2017-10-30 DIAGNOSIS — F419 Anxiety disorder, unspecified: Secondary | ICD-10-CM | POA: Insufficient documentation

## 2017-10-30 DIAGNOSIS — R42 Dizziness and giddiness: Secondary | ICD-10-CM | POA: Insufficient documentation

## 2017-10-30 DIAGNOSIS — Z880 Allergy status to penicillin: Secondary | ICD-10-CM | POA: Insufficient documentation

## 2017-10-30 DIAGNOSIS — D62 Acute posthemorrhagic anemia: Principal | ICD-10-CM | POA: Insufficient documentation

## 2017-10-30 DIAGNOSIS — O034 Incomplete spontaneous abortion without complication: Secondary | ICD-10-CM | POA: Diagnosis not present

## 2017-10-30 DIAGNOSIS — F329 Major depressive disorder, single episode, unspecified: Secondary | ICD-10-CM | POA: Insufficient documentation

## 2017-10-30 DIAGNOSIS — R002 Palpitations: Secondary | ICD-10-CM | POA: Diagnosis not present

## 2017-10-30 DIAGNOSIS — R51 Headache: Secondary | ICD-10-CM | POA: Diagnosis not present

## 2017-10-30 DIAGNOSIS — Z87891 Personal history of nicotine dependence: Secondary | ICD-10-CM | POA: Diagnosis not present

## 2017-10-30 DIAGNOSIS — J45909 Unspecified asthma, uncomplicated: Secondary | ICD-10-CM | POA: Diagnosis not present

## 2017-10-30 DIAGNOSIS — Z881 Allergy status to other antibiotic agents status: Secondary | ICD-10-CM | POA: Insufficient documentation

## 2017-10-30 DIAGNOSIS — D649 Anemia, unspecified: Secondary | ICD-10-CM

## 2017-10-30 LAB — CBC WITH DIFFERENTIAL/PLATELET
BASOS ABS: 0 10*3/uL (ref 0–0.1)
BASOS PCT: 0 %
EOS PCT: 1 %
Eosinophils Absolute: 0 10*3/uL (ref 0–0.7)
HCT: 19.4 % — ABNORMAL LOW (ref 35.0–47.0)
Hemoglobin: 6.6 g/dL — ABNORMAL LOW (ref 12.0–16.0)
LYMPHS PCT: 27 %
Lymphs Abs: 2 10*3/uL (ref 1.0–3.6)
MCH: 33.3 pg (ref 26.0–34.0)
MCHC: 34.2 g/dL (ref 32.0–36.0)
MCV: 97.4 fL (ref 80.0–100.0)
Monocytes Absolute: 0.6 10*3/uL (ref 0.2–0.9)
Monocytes Relative: 8 %
Neutro Abs: 4.8 10*3/uL (ref 1.4–6.5)
Neutrophils Relative %: 64 %
PLATELETS: 310 10*3/uL (ref 150–440)
RBC: 1.99 MIL/uL — AB (ref 3.80–5.20)
RDW: 14.4 % (ref 11.5–14.5)
WBC: 7.5 10*3/uL (ref 3.6–11.0)

## 2017-10-30 LAB — COMPREHENSIVE METABOLIC PANEL
ALBUMIN: 3.6 g/dL (ref 3.5–5.0)
ALT: 9 U/L (ref 0–44)
AST: 17 U/L (ref 15–41)
Alkaline Phosphatase: 45 U/L (ref 38–126)
Anion gap: 12 (ref 5–15)
BUN: 8 mg/dL (ref 6–20)
CALCIUM: 8.6 mg/dL — AB (ref 8.9–10.3)
CHLORIDE: 106 mmol/L (ref 98–111)
CO2: 19 mmol/L — AB (ref 22–32)
CREATININE: 0.64 mg/dL (ref 0.44–1.00)
GFR calc Af Amer: >60 mL/min (ref >60)
GFR calc non Af Amer: >60 mL/min (ref >60)
GLUCOSE: 89 mg/dL (ref 70–99)
Potassium: 3.3 mmol/L — ABNORMAL LOW (ref 3.5–5.1)
SODIUM: 137 mmol/L (ref 135–145)
TOTAL PROTEIN: 7.1 g/dL (ref 6.5–8.1)
Total Bilirubin: 0.2 mg/dL — ABNORMAL LOW (ref 0.3–1.2)

## 2017-10-30 LAB — URINALYSIS, COMPLETE (UACMP) WITH MICROSCOPIC
BILIRUBIN URINE: NEGATIVE
Bacteria, UA: NONE SEEN
Glucose, UA: NEGATIVE mg/dL
Ketones, ur: NEGATIVE mg/dL
NITRITE: NEGATIVE
Protein, ur: 30 mg/dL — AB
RBC / HPF: >50 RBC/hpf — ABNORMAL HIGH (ref 0–5)
SPECIFIC GRAVITY, URINE: 1.027 (ref 1.005–1.030)
pH: 5 (ref 5.0–8.0)

## 2017-10-30 LAB — HCG, QUANTITATIVE, PREGNANCY: HCG, BETA CHAIN, QUANT, S: 1449 m[IU]/mL — AB (ref <5)

## 2017-10-30 LAB — ABO/RH: ABO/RH(D): B POS

## 2017-10-30 LAB — PROTIME-INR
INR: 0.97
PROTHROMBIN TIME: 12.8 s (ref 11.4–15.2)

## 2017-10-30 LAB — APTT: aPTT: 29 seconds (ref 24–36)

## 2017-10-30 MED ORDER — ACETAMINOPHEN 325 MG PO TABS: 650.0000 mg | ORAL_TABLET | Freq: Once | ORAL | Status: DC

## 2017-10-30 MED ORDER — MAGNESIUM HYDROXIDE 400 MG/5ML PO SUSP: 30.0000 mL | Freq: Every day | ORAL | Status: DC | PRN

## 2017-10-30 MED ORDER — ONDANSETRON HCL 4 MG PO TABS: 4.0000 mg | ORAL_TABLET | Freq: Four times a day (QID) | ORAL | Status: DC | PRN

## 2017-10-30 MED ORDER — SODIUM CHLORIDE 0.9 % IV BOLUS
1000.0000 mL | Freq: Once | INTRAVENOUS | Status: AC
  Administered 2017-10-30: 1000 mL via INTRAVENOUS

## 2017-10-30 MED ORDER — LACTATED RINGERS IV SOLN: INTRAVENOUS | Status: DC

## 2017-10-30 MED ORDER — SODIUM CHLORIDE 0.9 % IV SOLN: 10.0000 mL/h | Freq: Once | INTRAVENOUS | Status: DC

## 2017-10-30 MED ORDER — ALUM & MAG HYDROXIDE-SIMETH 200-200-20 MG/5ML PO SUSP: 30.0000 mL | ORAL | Status: DC | PRN

## 2017-10-30 MED ORDER — MAGNESIUM CITRATE PO SOLN
1.0000 | Freq: Once | ORAL | Status: DC | PRN
  Filled 2017-10-30: qty 296

## 2017-10-30 MED ORDER — POTASSIUM CHLORIDE CRYS ER 20 MEQ PO TBCR
20.0000 meq | EXTENDED_RELEASE_TABLET | Freq: Once | ORAL | Status: AC
  Administered 2017-10-31: 20 meq via ORAL
  Filled 2017-10-30: qty 1

## 2017-10-30 MED ORDER — DIPHENHYDRAMINE HCL 25 MG PO CAPS: 25.0000 mg | ORAL_CAPSULE | Freq: Once | ORAL | Status: DC

## 2017-10-30 MED ORDER — BISACODYL 5 MG PO TBEC: 5.0000 mg | DELAYED_RELEASE_TABLET | Freq: Every day | ORAL | Status: DC | PRN

## 2017-10-30 MED ORDER — FUROSEMIDE 10 MG/ML IJ SOLN: 20.0000 mg | Freq: Once | INTRAMUSCULAR | Status: DC

## 2017-10-30 MED ORDER — IBUPROFEN 600 MG PO TABS: 600.0000 mg | ORAL_TABLET | Freq: Four times a day (QID) | ORAL | Status: DC | PRN

## 2017-10-30 MED ORDER — ONDANSETRON HCL 4 MG/2ML IJ SOLN: 4.0000 mg | Freq: Four times a day (QID) | INTRAMUSCULAR | Status: DC | PRN

## 2017-10-30 MED ORDER — SODIUM CHLORIDE 0.9 % IV BOLUS (SEPSIS): Freq: Once | INTRAVENOUS | Status: DC

## 2017-10-30 MED ORDER — DOCUSATE SODIUM 100 MG PO CAPS: 100.0000 mg | ORAL_CAPSULE | Freq: Two times a day (BID) | ORAL | Status: DC

## 2017-10-30 NOTE — ED Notes (Signed)
Dalbert GarnetBeasley MD to bedside

## 2017-10-30 NOTE — ED Notes (Signed)
[name] MD to bedside with update

## 2017-10-30 NOTE — ED Provider Notes (Addendum)
[facility] Emergency Department Provider Note   ____________________________________________   First MD Initiated Contact with Patient 10/30/17 1828     (approximate)  I have reviewed the triage vital signs and the nursing notes.   HISTORY  Chief Complaint Dizziness    HPI Lindsey Velazquez is a 27 y.o. female who reports she is feeling dizzy and having bad headaches.  She had been given pills for a miscarriage to help her expel the products of conception and was having dizziness and bleeding and cramping came to the ER on the 12th.  Plans were being made to admit her for [facility] but she could not get childcare and so left.  Patient comes back today feeling very weak and lightheaded having worse headaches and dizziness.  She is bleeding still but not as much and not cramping and has no belly pain now.  She still having trouble finding somebody to watch her for children.  She is going to try again.  I discussed the patient with Dr. Dalbert Garnet on-call for OB/GYN.  She recommends transfusion I agree as the patient's hemoglobin is now 6.6 is dropped 2 points since the 12th.  I obtained oral consent from the patient for the transfusion.   Past Medical History:  Diagnosis Date  . Anxiety   . Asthma    childhood  . Depression   . Eczema     Patient Active Problem List   Diagnosis Date Noted  . Normal labor 05/25/2016  . Indication for care in labor and delivery, antepartum 03/21/2016  . Neutropenia (HCC) 12/09/2013  . NSVD (normal spontaneous vaginal delivery) 02/29/2012    Class: Temporary    Past Surgical History:  Procedure Laterality Date  . NO PAST SURGERIES      Prior to Admission medications   Medication Sig Start Date End Date Taking? Authorizing Provider  amoxicillin (AMOXIL) 875 MG tablet Take 1 tablet (875 mg total) by mouth 2 (two) times daily. 05/23/17   Myrna Blazer, MD  brompheniramine-pseudoephedrine-DM 30-2-10 MG/5ML syrup Take 5  mLs by mouth 4 (four) times daily as needed. 04/13/17   Joni Reining, PA-C  gentamicin (GARAMYCIN) 0.3 % ophthalmic solution Place 1 drop into both eyes every 4 (four) hours. 04/13/17   Joni Reining, PA-C  ibuprofen (ADVIL,MOTRIN) 600 MG tablet Take 1 tablet (600 mg total) by mouth every 6 (six) hours. 05/27/16   Almon Hercules, MD  naphazoline (NAPHCON) 0.1 % ophthalmic solution Place 1 drop into both eyes 4 (four) times daily as needed for eye irritation. 04/13/17   Joni Reining, PA-C  Prenatal Vit-Fe Fumarate-FA (PRENATAL COMPLETE) 14-0.4 MG TABS Take 1 tablet by mouth 2 (two) times daily. Patient taking differently: Take 1 tablet by mouth daily.  10/23/15   Marlon Pel, PA-C    Allergies Clindamycin/lincomycin and Penicillins  Family History  Problem Relation Age of Onset  . Diabetes Mother   . Thyroid disease Sister     Social History Social History   Tobacco Use  . Smoking status: Former Smoker    Packs/day: 0.50    Types: Cigarettes  . Smokeless tobacco: Never Used  Substance Use Topics  . Alcohol use: Yes  . Drug use: No    Review of Systems  Constitutional: No fever/chills Eyes: No visual changes. ENT: No sore throat. Cardiovascular: Denies chest pain. Respiratory: Denies shortness of breath. Gastrointestinal: No abdominal pain.  No nausea, no vomiting.  No diarrhea.  No constipation. Genitourinary: Negative for  dysuria. Musculoskeletal: Negative for back pain. Skin: Negative for rash. Neurological: Negative for headaches, focal weakness   ____________________________________________   PHYSICAL EXAM:  VITAL SIGNS: ED Triage Vitals  Enc Vitals Group     BP 10/30/17 1602 120/68     Pulse Rate 10/30/17 1602 97     Resp 10/30/17 1602 20     Temp 10/30/17 1602 98.5 F (36.9 C)     Temp Source 10/30/17 1602 Oral     SpO2 10/30/17 1602 100 %     Weight 10/30/17 1602 124 lb (56.2 kg)     Height 10/30/17 1602 4\' 11"  (1.499 m)     Head Circumference --        Peak Flow --      Pain Score 10/30/17 1608 6     Pain Loc --      Pain Edu? --      Excl. in GC? --    Constitutional: Alert and oriented.  Pale and ill-appearing Eyes: Conjunctivae are pale. PERRL. EOMI. Head: Atraumatic. Nose: No congestion/rhinnorhea. Mouth/Throat: Mucous membranes are moist.  Oropharynx non-erythematous. Neck: No stridor.  Cardiovascular: Normal rate, regular rhythm. Grossly normal heart sounds.  Good peripheral circulation. Respiratory: Normal respiratory effort.  No retractions. Lungs CTAB. Gastrointestinal: Soft and nontender. No distention. No abdominal bruits. No CVA tenderness. Genitourinary: Deferred Musculoskeletal: No lower extremity tenderness nor edema.  No joint effusions. Neurologic:  Normal speech and language. No gross focal neurologic deficits are appreciated Skin:  Skin is warm, dry, pale and intact. No rash noted. Psychiatric: Mood and affect are normal. Speech and behavior are normal.  ____________________________________________   LABS (all labs ordered are listed, but only abnormal results are displayed)  Labs Reviewed  URINALYSIS, COMPLETE (UACMP) WITH MICROSCOPIC - Abnormal; Notable for the following components:      Result Value   Color, Urine YELLOW (*)    APPearance HAZY (*)    Hgb urine dipstick LARGE (*)    Protein, ur 30 (*)    Leukocytes, UA TRACE (*)    RBC / HPF >50 (*)    All other components within normal limits  CBC WITH DIFFERENTIAL/PLATELET - Abnormal; Notable for the following components:   RBC 1.99 (*)    Hemoglobin 6.6 (*)    HCT 19.4 (*)    All other components within normal limits  CBC WITH DIFFERENTIAL/PLATELET  COMPREHENSIVE METABOLIC PANEL  HCG, QUANTITATIVE, PREGNANCY  TYPE AND SCREEN  PREPARE RBC (CROSSMATCH)  ABO/RH   ____________________________________________  EKG   ____________________________________________  RADIOLOGY  ED MD interpretation ultrasound shows resolution of what  appeared to be the retained products of conception but there is still good deal of thickening in the endometrium  Official radiology report(s): [place] Ob Comp Less 14 Wks  Result Date: 10/30/2017 CLINICAL DATA:  Pelvic cramping and bleeding. First trimester pregnancy with inconclusive fetal viability. EXAM: OBSTETRIC <14 WK ULTRASOUND TECHNIQUE: Transabdominal ultrasound was performed for evaluation of the gestation as well as the maternal uterus and adnexal regions. COMPARISON:  10/24/2017 FINDINGS: Intrauterine gestational sac: No normal intrauterine gestational sac visualized Maternal uterus/adnexae: A small amount of fluid or blood is seen within the endometrial cavity but is decreased since previous study. Diffuse endometrial thickening is seen measuring 21 mm, however no focal endometrial mass is visualized. No fibroids identified. Both ovaries are normal appearance. No adnexal mass or abnormal free fluid identified. IMPRESSION: No intrauterine gestational sac visualized. Small amount of fluid in endometrial cavity has decreased since previous study.  Diffusely thickened endometrium, without evidence of focal retained products conception. Electronically Signed   By: Myles Rosenthal M.D.   On: 10/30/2017 17:31    ____________________________________________   PROCEDURES  Procedure(s) performed: Gust patient with Dr. Dalbert Garnet.  As I noted in HPI she recommends transfusion.  Patient is going to try and find someone to watch her children again.  Dr. Dalbert Garnet will come in and begin checking on the patient.  Procedures  Critical Care performed: Critical care time 20 minutes including talking to Dr. Dalbert Garnet and the patient and ordering the labs and studies initially when the patient first came in.  ____________________________________________   INITIAL IMPRESSION / ASSESSMENT AND PLAN / ED COURSE Patient has not been able to find anyone to watch her children.  I discussed this with Dr.Beasley and the plan  is that we will transfuse her here and then Dr. Dalbert Garnet will follow her up in the office.  Patient will return here immediately if anything gets worse.         ____________________________________________   FINAL CLINICAL IMPRESSION(S) / ED DIAGNOSES  Final diagnoses:  Symptomatic anemia     ED Discharge Orders    None       Note:  This document was prepared using Dragon voice recognition software and may include unintentional dictation errors.    Arnaldo Natal, MD 10/30/17 [place]    Arnaldo Natal, MD 10/30/17 2051

## 2017-10-30 NOTE — Discharge Instructions (Addendum)
Please return here if you are worse at all.  Call Dr. Dalbert GarnetBeasley first thing in the morning let her know that you are the person that she said she would see in the office.  She will tell you if she wants you tomorrow or Friday.  Plan on going to see her most likely tomorrow.  Return here for increasing lightheadedness, heavier bleeding   (especially if you are soaking a pad an hour or more), pain, fever or any other problems.

## 2017-10-30 NOTE — ED Notes (Signed)
Carlye Grippehristine P RN aware of bed assigned

## 2017-10-30 NOTE — ED Triage Notes (Addendum)
Patient states "I feel dizzy and I have been having bad headaches. I was here last week for miscarriage. They wanted me to stay for D/C but I couldn't because I had no one to keep my kids. The health department told me to come back to get my blood levels checked again" She also reports still having minimal vaginal bleeding with clots. Patient stated "I cannot keep carrying on like this, I need to know whats going on" Patient states she is unsure if she can stay if told she needs to but just wants to know whats going on. Ambulatory to triage with no problems.

## 2017-10-30 NOTE — ED Notes (Signed)
Lab staff to bedside to draw blood work that has hemolyzed

## 2017-10-30 NOTE — ED Notes (Signed)
Beth EDT aware that blood needs to be picked up from blood bank for transfusion.

## 2017-10-30 NOTE — H&P (Signed)
Consult Visit   SERVICE: Gynecology   Patient Name: Lindsey Velazquez Patient MRN:   [id number]  CC: Dizziness and palpitations  HPI: Lindsey Velazquez is a 27 y.o. [id number] presenting with symptoms of acute anemia after MAB, treated as outpatient at [facility] with "3 pills" which did cause bleeding and cramping. She presented to ER on 10/24/17 with sx of heavy bleeding and pain, and an u/s at that time was concerning for retained POC. However, due to childcare issues, pt signed out AMA without transfusion or D&C.  She presented tonight with dizziness, headache and nausea and found to have profound anemia with H/H of 6.6/19.4 down from 8.7/24.9 last week. Ultrasound today more reassuring with a 2cm homogenous stripe. Her bleeding has improved since her visit last week. No fevers or aches, and a normal WBC makes intrauterine infection unlikely. No smelly discharge.  Beta hcg today 1449, down from 4995 on 10/24/17  She is present with her four children in the ER, and despite having tried to find another adult to care for them or accompany her to the hospital, she is alone. On entering the room, I found her children sitting quietly and entertaining each other, including a cheerful 29 yr old, despite having been in the ER since 16:00 yesterday, now approx 6 hours. She was on the phone with family in another state, and started crying on hanging up.  I did speak with her sister in [place], who had ordered pizza for the kids to eat at the hospital, and was engaged in trying to figure out how to help the patient as well.   Review of Systems: positives in bold GEN:   fevers, chills, weight changes, appetite changes, fatigue, night sweats, dizziness but no syncope HEENT:  HA, vision changes, hearing loss, congestion, rhinorrhea, sinus pressure, dysphagia CV:   CP, palpitations PULM:  SOB, cough GI:  abd pain, N/V/D/C, vaginal bleeding GU:  dysuria, urgency, frequency MSK:  arthralgias, myalgias, back pain,  swelling SKIN:  rashes, color changes, pallor NEURO:  numbness, weakness, tingling, seizures, dizziness, tremors PSYCH:  depression, anxiety, behavioral problems, confusion  HEME/LYMPH:  easy bruising or bleeding ENDO:  heat/cold intolerance  Past Obstetrical History: OB History    Gravida  6   Para  4   Term  3   Preterm  1   AB  1   Living  4     SAB  1   TAB  0   Ectopic  0   Multiple  0   Live Births  4           Past Gynecologic History: No LMP recorded. (Menstrual status: Irregular Periods).   Past Medical History: Past Medical History:  Diagnosis Date  . Anxiety   . Asthma    childhood  . Depression   . Eczema     Past Surgical History:   Past Surgical History:  Procedure Laterality Date  . NO PAST SURGERIES      Family History:  family history includes Diabetes in her mother; Thyroid disease in her sister.  Social History:  Social History   Socioeconomic History  . Marital status: Single    Spouse name: Not on file  . Number of children: Not on file  . Years of education: Not on file  . Highest education level: Not on file  Occupational History  . Not on file  Social Needs  . Financial resource strain: Not on file  . Food insecurity:    Worry:  Not on file    Inability: Not on file  . Transportation needs:    Medical: Not on file    Non-medical: Not on file  Tobacco Use  . Smoking status: Former Smoker    Packs/day: 0.50    Types: Cigarettes  . Smokeless tobacco: Never Used  Substance and Sexual Activity  . Alcohol use: Yes  . Drug use: No  . Sexual activity: Yes    Birth control/protection: None  Lifestyle  . Physical activity:    Days per week: Not on file    Minutes per session: Not on file  . Stress: Not on file  Relationships  . Social connections:    Talks on phone: Not on file    Gets together: Not on file    Attends religious service: Not on file    Active member of club or organization: Not on file     Attends meetings of clubs or organizations: Not on file    Relationship status: Not on file  . Intimate partner violence:    Fear of current or ex partner: Not on file    Emotionally abused: Not on file    Physically abused: Not on file    Forced sexual activity: Not on file  Other Topics Concern  . Not on file  Social History Narrative  . Not on file    Home Medications:  Medications reconciled in EPIC  No current facility-administered medications on file prior to encounter.    Current Outpatient Medications on File Prior to Encounter  Medication Sig Dispense Refill  . naproxen sodium (ALEVE) 220 MG tablet Take 220-440 mg by mouth 2 (two) times daily as needed (pain).    Marland Kitchen. amoxicillin (AMOXIL) 875 MG tablet Take 1 tablet (875 mg total) by mouth 2 (two) times daily. (Patient not taking: Reported on 10/30/2017) 20 tablet 0  . brompheniramine-pseudoephedrine-DM 30-2-10 MG/5ML syrup Take 5 mLs by mouth 4 (four) times daily as needed. (Patient not taking: Reported on 10/30/2017) 120 mL 0  . gentamicin (GARAMYCIN) 0.3 % ophthalmic solution Place 1 drop into both eyes every 4 (four) hours. (Patient not taking: Reported on 10/30/2017) 5 mL 0  . Prenatal Vit-Fe Fumarate-FA (PRENATAL COMPLETE) 14-0.4 MG TABS Take 1 tablet by mouth 2 (two) times daily. (Patient not taking: Reported on 10/30/2017) 60 each 2    Allergies:  Allergies  Allergen Reactions  . Clindamycin/Lincomycin Rash  . Penicillins Hives and Rash    Has patient had a PCN reaction causing immediate rash, facial/tongue/throat swelling, SOB or lightheadedness with hypotension: Yes Has patient had a PCN reaction causing severe rash involving mucus membranes or skin necrosis: No Has patient had a PCN reaction that required hospitalization No Has patient had a PCN reaction occurring within the last 10 years: No If all of the above answers are "NO", then may proceed with Cephalosporin use.     Physical Exam:  Temp:  [98.3 F (36.8  C)-98.5 F (36.9 C)] 98.3 F (36.8 C) (09/18 2010) Pulse Rate:  [83-97] 83 (09/18 2010) Resp:  [15-20] 15 (09/18 2010) BP: (105-124)/(66-73) 105/73 (09/18 2010) SpO2:  [100 %] 100 % (09/18 2010) Weight:  [56.2 kg] 56.2 kg (09/18 1602)   General Appearance:  Well developed, well nourished, no moderate distress, alert and oriented x3 HEENT:  Normocephalic atraumatic, extraocular movements intact, Abdomen:   soft, nontender, nondistended, no abnormal masses, no epigastric pain, non tender over lower abdomen Extremities:  Full range of motion, no pedal edema,  Skin:  no rashes, no suspicious skin lesions noted  Psychiatric:  Overwhelmed mood and affect, tearful, appropriate, no AH/VH Pelvic: deferred at patient request   Labs/Studies:   CBC and Coags:  Lab Results  Component Value Date   WBC 7.5 10/30/2017   NEUTOPHILPCT 64 10/30/2017   EOSPCT 1 10/30/2017   BASOPCT 0 10/30/2017   LYMPHOPCT 27 10/30/2017   HGB 6.6 (L) 10/30/2017   HCT 19.4 (L) 10/30/2017   MCV 97.4 10/30/2017   PLT 310 10/30/2017   INR 0.97 10/30/2017   CMP:  Lab Results  Component Value Date   NA 137 10/30/2017   K 3.3 (L) 10/30/2017   CL 106 10/30/2017   CO2 19 (L) 10/30/2017   BUN 8 10/30/2017   CREATININE 0.64 10/30/2017   CREATININE 0.51 10/24/2017   CREATININE 0.76 08/29/2015   PROT 7.1 10/30/2017   BILITOT 0.2 (L) 10/30/2017   ALT 9 10/30/2017   AST 17 10/30/2017   ALKPHOS 45 10/30/2017    Other Imaging: US Ob Comp Less 14 Wks  Result Date: 10/30/2017 CLINICAL DATA:  Pelvic cramping and bleeding. First trimester pregnancy with inconclusive fetal viability. EXAM: OBSTETRIC <14 WK ULTRASOUND TECHNIQUE: Transabdominal ultrasound was performed for evaluation of the gestation as well as the maternal uterus and adnexal regions. COMPARISON:  10/24/2017 FINDINGS: Intrauterine gestational sac: No normal intrauterine gestational sac visualized Maternal uterus/adnexae: A small amount of fluid or blood  is seen within the endometrial cavity but is decreased since previous study. Diffuse endometrial thickening is seen measuring 21 mm, however no focal endometrial mass is visualized. No fibroids identified. Both ovaries are normal appearance. No adnexal mass or abnormal free fluid identified. IMPRESSION: No intrauterine gestational sac visualized. Small amount of fluid in endometrial cavity has decreased since previous study. Diffusely thickened endometrium, without evidence of focal retained products conception. Electronically Signed   By: Myles Rosenthal M.D.   On: 10/30/2017 17:31   US Ob Comp Less 14 Wks  Result Date: 10/24/2017 CLINICAL DATA:  Suspected miscarriage. EXAM: OBSTETRIC <14 WK [place] AND TRANSVAGINAL OB US TECHNIQUE: Both transabdominal and transvaginal ultrasound examinations were performed for complete evaluation of the gestation as well as the maternal uterus, adnexal regions, and pelvic cul-de-sac. Transvaginal technique was performed to assess early pregnancy. COMPARISON:  10/15/2017 FINDINGS: Intrauterine gestational sac: Single. Abnormally elongated. The endometrium is thickened to 2.8 cm and hypervascular. Yolk sac:  Not Visualized. Embryo:  Not Visualized. MSD: 26.4 mm   7 w   for d Scratch Subchorionic hemorrhage:  None visualized. Maternal uterus/adnexae: Normal appearance of the ovaries. IMPRESSION: Elongated hypoechoic structure within the lower uterine segment may represent abnormally appearing vacant intrauterine gestational sac versus fluid collection. Thickened heterogeneous endometrium. Retained placental products cannot be excluded with this appearance. Normal appearance of the ovaries. Electronically Signed   By: Ted Mcalpine M.D.   On: 10/24/2017 13:30   US Ob Comp Less 14 Wks  Result Date: 10/16/2017 ULTRASOUND REPORT Patient Name: Hadassah Rana DOB: Jul 09, 1990 MRN: [id number] Location: [facility] Date of Service: 10/15/2017 Indications:dating Findings: Mason Jim  intrauterine pregnancy is visualized, but appears to be a missed abortion vs other. No fetal pole Irregular gestational sac FHR: n/a CRL measurement: n/a Yolk sac is not visualized Amnion: not visualized Right Ovary is normal in appearance. Left Ovary is normal appearance. Corpus luteal cyst:  is not visualized Survey of the adnexa demonstrates no adnexal masses. There is no free peritoneal fluid in the cul de sac. Impression: 1. Missed AB  Recommendations: 1.Clinical correlation with the patient's History and Physical Exam. Willette Alma, RDMS, RVT Review of ULTRASOUND.    I have personally reviewed images and report of recent ultrasound done at [facility].    Plan of management to be discussed with patient. Annamarie Major, MD, [facility], [facility] 10/16/2017  10:19 AM     Assessment / Plan:   NAARA KELTY is a 27 y.o. [id number] who presents with acute blood loss anemia and symptomatic anemia from miscarriage bleeding  1. ABLA: Recommend 4 u pRBCs with monitoring. Lasix in between with po repletion of potassium. Recommend monitoring overnight for transfusion 2. Incomplete AB: Her bleeding has slowed significantly, her endometrium appears free from focal retained POC, and it appears that her anemia is the medical concern. However, I recommend repeat beta hcg weekly to monitor return to 0, with a low clinical threshold for suction D&C if her beta hcg plateaus. 3. Contraception: She is planning for Nexplanon placement with the health department as an outpatient  4. Social concerns: Pt is without current social support that can be called in on short notice. I witnessed her trying to find another adult to come to the hospital, and I witnessed her distress that she was alone. She did leave AMA for the same reason last week, and that she is now in the ER with significant medical sequelae reinforces for me how important it is that she receive care.  Her children are present and there  is a real concern that if she were to experience a dangerous outcome, she has not provided, despite her best efforts, another adult to be responsible for them. For this reason, I reached out to the nursing staff chain of command to help find a solution. It appears that we do not have an easy solution, and that none of the sitters, support staff, or anyone else is able to be backup care for her. I did offer that I would sit with her kids if an emergency arises during her transfusion, which I was reminded might not be ideal if I have other critical patients to care for. She was unable to be admitted to the floor because of this liability. I was told that social services would be investigating her situation and that emergency foster care could be arranged to remove her children from the hospital and place them temporarily. This does seem like a trauma-inducing solution for all involved, and one to be avoided if we can.  Therefore, we decided to complete her transfusions at two units of pRBCs and discharge her after an hour of observation.   Her care was assumed by the oncoming ER physician, who confirmed for me that a third unit of pRBCs would not delay her leaving the hospital by long, and that though it would be reasonable to give her 2 units of blood (which she had received by this time and was our initial plan), it is clinically appropriate to complete another unit, and we made the decision to proceed with the third unit, followed by a repeat H/H, with plans to discharge from the ER.  I am remaining in house in case another adult is required for the next two hours of transfusion and monitoring and am ready to assist as needed.  At this time, her nurse has assisted to place an extra bed in the room and all four children are sleeping with blankets and two pillows, as is the patient.   Thank you for the opportunity  to be involved with this pt's care. I appreciated pastoral care's support as well.

## 2017-10-30 NOTE — ED Notes (Signed)
Floor will not take pt d/t 4 children. Stretcher brought into room for children to lay on. Pt remains aware of POC.

## 2017-10-30 NOTE — ED Notes (Signed)
Consent form signed by patient and witnessed by this RN. Consent form placed in pts chart.

## 2017-10-30 NOTE — ED Provider Notes (Signed)
-----------------------------------------   9:56 PM on 10/30/2017 -----------------------------------------  I took over care on this patient from Dr. Juliette AlcideMelinda.  She has been evaluated by Dr. Dalbert GarnetBeasley.  Dr. Dalbert GarnetBeasley would like to admit her inpatient and her kids can stay in the room with her.  The patient is agreeable to this plan.   Dionne BucySiadecki, Gloyd Happ, MD 10/30/17 [id number]

## 2017-10-30 NOTE — ED Notes (Signed)
Pt tolerating blood transfusion well. Rate increased to 27650ml/hour per MD. Pt denies CP/SOB/chills. Call bell within reach.

## 2017-10-30 NOTE — ED Notes (Addendum)
Lindsey CatalanMalinda MD at bedside [facility] aware of Hemoglobin 6.6

## 2017-10-30 NOTE — ED Notes (Addendum)
Pt tolerating blood transfusion well. Rate increased to 250ml/hour per MD. Pt denies CP/SOB/chills. Call bell within reach.  

## 2017-10-30 NOTE — ED Notes (Signed)
IV team at bedside to place 2nd IV

## 2017-10-31 LAB — CBC
HCT: 28.5 % — ABNORMAL LOW (ref 35.0–47.0)
Hemoglobin: 10.4 g/dL — ABNORMAL LOW (ref 12.0–16.0)
MCH: 33.3 pg (ref 26.0–34.0)
MCHC: 36.7 g/dL — ABNORMAL HIGH (ref 32.0–36.0)
MCV: 90.8 fL (ref 80.0–100.0)
Platelets: 242 10*3/uL (ref 150–440)
RBC: 3.13 MIL/uL — ABNORMAL LOW (ref 3.80–5.20)
RDW: 15.1 % — AB (ref 11.5–14.5)
WBC: 7.8 10*3/uL (ref 3.6–11.0)

## 2017-10-31 NOTE — ED Provider Notes (Signed)
-----------------------------------------   12:30 AM on 10/31/2017 -----------------------------------------  Spoke with Dr. Dalbert GarnetBeasley who has come to reevaluate the patient.  Patient does not want her children taken away from her and she does not have anyone to care for them.  Plan now is to transfuse patient 3 units of PRBCs in the emergency department and recheck H/H.  If H/H stabilizes, will plan for discharge home with close follow-up.   ----------------------------------------- 6:08 AM on 10/31/2017 -----------------------------------------  Patient sleeping in no acute distress.  H/H has increased to 10.4/28.5.  Patient ambulated with steady gait to the restroom without dizziness.  She will be discharged home with close follow-up with OB/GYN.  Strict return precautions given.  Patient verbalizes understanding and agrees with plan of care.   Irean HongSung, Makenah Karas J, MD 10/31/17 [phone]

## 2017-10-31 NOTE — ED Notes (Signed)
Transfusion Blood products entered the Pt's bloodstream.

## 2017-10-31 NOTE — ED Notes (Addendum)
[place] [employer] contacted in concern for patient and 4 children present with this emergency visit, where patient has need for medical care and sts that she is unable to provide anyone to care for children while she receives care. Social worker, Designer, industrial/producthamika Scotten, informs this RN that pt has open cases with social services and has Child psychotherapistsocial worker, AnimatorLatisha Logan. The concern is, that pts children are adequately cared for on the return after mother is discharged from facility as well as if the need that pt needs to be admitted into hospital that the children will be cared for by social services.

## 2017-10-31 NOTE — Progress Notes (Signed)
   10/31/17 0000  Clinical Encounter Type  Visited With Patient;Health care provider  Visit Type Initial;Spiritual support  Referral From Physician  Recommendations Follow-up as requested.  Spiritual Encounters  Spiritual Needs Emotional  Stress Factors  Patient Stress Factors  (Assistance with keeping her children.)   Chaplain responded to a page to the ED to meet with patient and Dr. Christeen DouglasBethany Beasley to discuss possible resources for the family. Chaplain did not know of existing resources for overnight childcare but offered to inquire. Chaplain had a conversation with AGCO CorporationJerrica Royal about [facility] resources. Royal did not know of any and reached out to her supervisor. Royal will contact Social Work in the morning for additional assistance. Chaplain followed-up with charge nurse Janus MolderVanessa Ashley, who had concerns about the support of the patient's children. Morrie SheldonAshley and Dalbert GarnetBeasley had a conversation about the matter. The resolution is unfolding. Chaplain returned to the patient's room. The patient was irritated, and her children were trying to settle down in a spare bed brought into the suite. Chaplain offered to assist further if needed.  Throughout Chaplain offered pastoral presence and spiritual support.

## 2017-11-01 LAB — TYPE AND SCREEN
ABO/RH(D): B POS
Antibody Screen: NEGATIVE
UNIT DIVISION: 0
UNIT DIVISION: 0
Unit division: 0
Unit division: 0

## 2017-11-01 LAB — BPAM RBC
BLOOD PRODUCT EXPIRATION DATE: 201910052359
BLOOD PRODUCT EXPIRATION DATE: 201910142359
BLOOD PRODUCT EXPIRATION DATE: 201910162359
Blood Product Expiration Date: 201910162359
ISSUE DATE / TIME: 201909182216
ISSUE DATE / TIME: 201909190107
ISSUE DATE / TIME: 201909191355
ISSUE DATE / TIME: 201909182012
UNIT TYPE AND RH: 5100
UNIT TYPE AND RH: 7300
Unit Type and Rh: 1700
Unit Type and Rh: 5100

## 2017-11-01 LAB — PREPARE RBC (CROSSMATCH)

## 2018-12-17 DIAGNOSIS — F438 Other reactions to severe stress: Secondary | ICD-10-CM | POA: Diagnosis not present

## 2019-01-07 DIAGNOSIS — O09899 Supervision of other high risk pregnancies, unspecified trimester: Secondary | ICD-10-CM

## 2019-01-12 ENCOUNTER — Ambulatory Visit: Payer: Self-pay

## 2019-01-19 DIAGNOSIS — O4690 Antepartum hemorrhage, unspecified, unspecified trimester: Secondary | ICD-10-CM | POA: Diagnosis not present

## 2019-01-22 DIAGNOSIS — O4690 Antepartum hemorrhage, unspecified, unspecified trimester: Secondary | ICD-10-CM | POA: Diagnosis not present

## 2019-01-28 DIAGNOSIS — O200 Threatened abortion: Secondary | ICD-10-CM | POA: Diagnosis not present

## 2019-01-28 DIAGNOSIS — Z3201 Encounter for pregnancy test, result positive: Secondary | ICD-10-CM | POA: Diagnosis not present

## 2019-01-28 DIAGNOSIS — N912 Amenorrhea, unspecified: Secondary | ICD-10-CM | POA: Diagnosis not present

## 2019-01-29 ENCOUNTER — Emergency Department
Admission: EM | Admit: 2019-01-29 | Discharge: 2019-01-29 | Discharge status: Against Medical Advice | Payer: Medicaid Other

## 2019-01-29 DIAGNOSIS — O200 Threatened abortion: Secondary | ICD-10-CM | POA: Diagnosis not present

## 2019-01-29 NOTE — ED Triage Notes (Signed)
Pt called for triage, no response. 

## 2019-01-29 NOTE — ED Triage Notes (Signed)
Pt called for triage no response ?

## 2019-01-29 NOTE — ED Triage Notes (Signed)
Called from [facility] for triage, no response

## 2019-02-02 DIAGNOSIS — O200 Threatened abortion: Secondary | ICD-10-CM | POA: Diagnosis not present

## 2019-02-10 ENCOUNTER — Emergency Department
Admission: EM | Admit: 2019-02-10 | Discharge: 2019-02-10 | Disposition: A | Discharge status: Home / Self Care | Payer: Medicaid Other | Attending: Emergency Medicine | Admitting: Emergency Medicine

## 2019-02-10 ENCOUNTER — Encounter: Payer: Self-pay | Admitting: Emergency Medicine

## 2019-02-10 ENCOUNTER — Other Ambulatory Visit: Payer: Self-pay

## 2019-02-10 DIAGNOSIS — J45909 Unspecified asthma, uncomplicated: Secondary | ICD-10-CM | POA: Diagnosis not present

## 2019-02-10 DIAGNOSIS — O039 Complete or unspecified spontaneous abortion without complication: Secondary | ICD-10-CM | POA: Diagnosis not present

## 2019-02-10 DIAGNOSIS — O209 Hemorrhage in early pregnancy, unspecified: Secondary | ICD-10-CM | POA: Diagnosis present

## 2019-02-10 DIAGNOSIS — O9952 Diseases of the respiratory system complicating childbirth: Secondary | ICD-10-CM | POA: Diagnosis not present

## 2019-02-10 DIAGNOSIS — Z87891 Personal history of nicotine dependence: Secondary | ICD-10-CM | POA: Insufficient documentation

## 2019-02-10 DIAGNOSIS — Z3A01 Less than 8 weeks gestation of pregnancy: Secondary | ICD-10-CM | POA: Insufficient documentation

## 2019-02-10 LAB — HCG, QUANTITATIVE, PREGNANCY: hCG, Beta Chain, Quant, S: 781 m[IU]/mL — ABNORMAL HIGH (ref <5)

## 2019-02-10 LAB — CBC WITH DIFFERENTIAL/PLATELET
Abs Immature Granulocytes: 0.01 10*3/uL (ref 0.00–0.07)
Basophils Absolute: 0 10*3/uL (ref 0.0–0.1)
Basophils Relative: 1 %
Eosinophils Absolute: 0.1 10*3/uL (ref 0.0–0.5)
Eosinophils Relative: 2 %
HCT: 29.3 % — ABNORMAL LOW (ref 36.0–46.0)
Hemoglobin: 9.3 g/dL — ABNORMAL LOW (ref 12.0–15.0)
Immature Granulocytes: 0 %
Lymphocytes Relative: 46 %
Lymphs Abs: 2.9 10*3/uL (ref 0.7–4.0)
MCH: 25.8 pg — ABNORMAL LOW (ref 26.0–34.0)
MCHC: 31.7 g/dL (ref 30.0–36.0)
MCV: 81.4 fL (ref 80.0–100.0)
Monocytes Absolute: 0.6 10*3/uL (ref 0.1–1.0)
Monocytes Relative: 9 %
Neutro Abs: 2.7 10*3/uL (ref 1.7–7.7)
Neutrophils Relative %: 42 %
Platelets: 341 10*3/uL (ref 150–400)
RBC: 3.6 MIL/uL — ABNORMAL LOW (ref 3.87–5.11)
RDW: 19.4 % — ABNORMAL HIGH (ref 11.5–15.5)
WBC: 6.3 10*3/uL (ref 4.0–10.5)
nRBC: 0 % (ref 0.0–0.2)

## 2019-02-10 LAB — POCT PREGNANCY, URINE: Preg Test, Ur: POSITIVE — AB

## 2019-02-10 NOTE — ED Provider Notes (Signed)
[facility] Emergency Department Provider Note  ____________________________________________   First MD Initiated Contact with Patient 02/10/19 1459     (approximate)  I have reviewed the triage vital signs and the nursing notes.   HISTORY  Chief Complaint Vaginal Bleeding    HPI Lindsey Velazquez is a 28 y.o. female  Here with vaginal bleeding. Pt has recently been worked up for threatened AB by her OB. She has had downtrending/stable HCG levels with TVUS showing IUP without clear pole/HR. She states she passed a significant amount of tissue this week and would like to know if she is still pregnant. Denies any ongoing abd pain, bleeding, or clotting. She denies any other complaints. No CP, SOB, lightheadedness. No vaginal discharge.         Past Medical History:  Diagnosis Date  . Anxiety   . Asthma    childhood  . Depression   . Eczema   . HPV in female     Patient Active Problem List   Diagnosis Date Noted  . Acute blood loss anemia 10/30/2017  . Varicella, non-immune 10/11/2017  . Neutropenia (HCC) 12/09/2013  . Depressive disorder 09/11/2012  . Generalized anxiety disorder 09/11/2012  . H/O premature delivery 09/13/2011    Past Surgical History:  Procedure Laterality Date  . NO PAST SURGERIES      Prior to Admission medications   Not on File    Allergies Clindamycin/lincomycin and Penicillins  Family History  Problem Relation Age of Onset  . Diabetes Mother   . Hypertension Mother   . Heart disease Mother   . Thyroid disease Sister   . Diabetes Sister   . Asthma Father   . Diabetes Maternal Aunt   . Cancer Paternal Uncle     Social History Social History   Tobacco Use  . Smoking status: Former Smoker    Packs/day: 0.50    Types: Cigarettes  . Smokeless tobacco: Never Used  Substance Use Topics  . Alcohol use: Yes  . Drug use: No    Review of Systems  Review of Systems  Constitutional: Negative for chills  and fever.  HENT: Negative for sore throat.   Respiratory: Negative for shortness of breath.   Cardiovascular: Negative for chest pain.  Gastrointestinal: Negative for abdominal pain.  Genitourinary: Positive for pelvic pain (now resolved) and vaginal bleeding (now resolved). Negative for flank pain.  Musculoskeletal: Negative for neck pain.  Skin: Negative for rash and wound.  Allergic/Immunologic: Negative for immunocompromised state.  Neurological: Negative for weakness and numbness.  Hematological: Does not bruise/bleed easily.     ____________________________________________  PHYSICAL EXAM:      VITAL SIGNS: ED Triage Vitals  Enc Vitals Group     BP 02/10/19 1417 121/85     Pulse Rate 02/10/19 1417 90     Resp 02/10/19 1417 20     Temp 02/10/19 1417 97.9 F (36.6 C)     Temp Source 02/10/19 1417 Oral     SpO2 02/10/19 1417 100 %     Weight 02/10/19 1417 120 lb (54.4 kg)     Height 02/10/19 1417 4\' 11"  (1.499 m)     Head Circumference --      Peak Flow --      Pain Score 02/10/19 1435 0     Pain Loc --      Pain Edu? --      Excl. in GC? --      Physical Exam Vitals and nursing  note reviewed.  Constitutional:      General: She is not in acute distress.    Appearance: She is well-developed.  HENT:     Head: Normocephalic and atraumatic.  Eyes:     Conjunctiva/sclera: Conjunctivae normal.  Cardiovascular:     Rate and Rhythm: Normal rate and regular rhythm.     Heart sounds: Normal heart sounds.  Pulmonary:     Effort: Pulmonary effort is normal. No respiratory distress.     Breath sounds: No wheezing.  Abdominal:     General: Abdomen is flat. There is no distension.     Tenderness: There is no abdominal tenderness. There is no guarding or rebound.     Comments: No suprapubic or abd TTP, no rebound or guarding  Musculoskeletal:     Cervical back: Neck supple.  Skin:    General: Skin is warm.     Capillary Refill: Capillary refill takes less than 2  seconds.     Findings: No rash.  Neurological:     Mental Status: She is alert and oriented to person, place, and time.     Motor: No abnormal muscle tone.       ____________________________________________   LABS (all labs ordered are listed, but only abnormal results are displayed)  Labs Reviewed  CBC WITH DIFFERENTIAL/PLATELET - Abnormal; Notable for the following components:      Result Value   RBC 3.60 (*)    Hemoglobin 9.3 (*)    HCT 29.3 (*)    MCH 25.8 (*)    RDW 19.4 (*)    All other components within normal limits  HCG, QUANTITATIVE, PREGNANCY - Abnormal; Notable for the following components:   hCG, Beta Chain, Quant, S 781 (*)    All other components within normal limits  POCT PREGNANCY, URINE - Abnormal; Notable for the following components:   Preg Test, Ur POSITIVE (*)    All other components within normal limits  POC URINE PREG, ED    ____________________________________________  EKG: None ________________________________________  RADIOLOGY All imaging, including plain films, CT scans, and ultrasounds, independently reviewed by me, and interpretations confirmed via formal radiology reads.  ED MD interpretation:   None  Official radiology report(s): No results found.  ____________________________________________  PROCEDURES   Procedure(s) performed (including Critical Care):  Procedures  ____________________________________________  INITIAL IMPRESSION / MDM / [place] / ED COURSE  As part of my medical decision making, I reviewed the following data within the [place] notes reviewed and incorporated, Old chart reviewed, Notes from prior ED visits, and [place] Controlled Substance Database       *Lindsey Velazquez was evaluated in Emergency Department on 02/10/2019 for the symptoms described in the history of present illness. She was evaluated in the context of the global COVID-19 pandemic, which necessitated  consideration that the patient might be at risk for infection with the SARS-CoV-2 virus that causes COVID-19. Institutional protocols and algorithms that pertain to the evaluation of patients at risk for COVID-19 are in a state of rapid change based on information released by regulatory bodies including the CDC and federal and state organizations. These policies and algorithms were followed during the patient's care in the ED.  Some ED evaluations and interventions may be delayed as a result of limited staffing during the pandemic.*     Medical Decision Making:  28 yo F here with vaginal bleeding and pain, now resolved. Recently diagnosed with threatened AB by her OBGYN. Hcg level  781 here today, markedly decreased from 1815 on 12/21. Suspect completed AB. No fever, leukocytosis, abd pain, or sigs to suggest incomplete or septic AB. Abdomen is soft, NT, ND. Hgb is stable/at baseline. Discussed and offered repeat U/S today but pt declines, states she just wanted to know if her hormone level was going down. She has an appt with her OB on 1/3. Return precautions given. ____________________________________________  FINAL CLINICAL IMPRESSION(S) / ED DIAGNOSES  Final diagnoses:  Spontaneous miscarriage     MEDICATIONS GIVEN DURING THIS VISIT:  Medications - No data to display   ED Discharge Orders    None       Note:  This document was prepared using Dragon voice recognition software and may include unintentional dictation errors.   Shaune Pollack, MD 02/10/19 [phone]

## 2019-02-10 NOTE — ED Notes (Signed)
Patient calling out stating she is going to leave. Dr. Ellender Hose in to see patient and explain discharge instructions. Patient verbalized understanding and denies further questions. Patient ambulatory to lobby with steady gait and NAD. Refused discharge vitals and to sign for discharge paper.

## 2019-02-10 NOTE — ED Notes (Signed)
See triage note   Presents with some vaginal bleeding  States she is pregnant . States bleeding has slowed down  Noticed some blood on tissue

## 2019-02-10 NOTE — ED Triage Notes (Signed)
Pt here with c/o vaginal bleeding that began around 01/30/19, then bleeding stopped, spotting intermittantly noted on tissue when she wipes now, obgyn confirmed pregnancy a few weeks ago via vaginal [place], pt states she has seen her OBGYN weekly for the past few weeks, pregnancy # five.

## 2019-02-10 NOTE — Discharge Instructions (Addendum)
Follow-up with your OBGYN on 1/3 as we discussed  Return to the ER immediately if you have any pain or bleeding

## 2019-06-19 DIAGNOSIS — F438 Other reactions to severe stress: Secondary | ICD-10-CM | POA: Diagnosis not present

## 2019-07-07 DIAGNOSIS — F438 Other reactions to severe stress: Secondary | ICD-10-CM | POA: Diagnosis not present

## 2019-07-14 DIAGNOSIS — F438 Other reactions to severe stress: Secondary | ICD-10-CM | POA: Diagnosis not present

## 2019-07-21 DIAGNOSIS — F438 Other reactions to severe stress: Secondary | ICD-10-CM | POA: Diagnosis not present

## 2019-08-13 DIAGNOSIS — Z419 Encounter for procedure for purposes other than remedying health state, unspecified: Secondary | ICD-10-CM | POA: Diagnosis not present

## 2019-09-13 DIAGNOSIS — Z419 Encounter for procedure for purposes other than remedying health state, unspecified: Secondary | ICD-10-CM | POA: Diagnosis not present

## 2019-09-16 ENCOUNTER — Emergency Department
Admission: EM | Admit: 2019-09-16 | Discharge: 2019-09-16 | Disposition: A | Discharge status: Home / Self Care | Payer: Medicaid Other | Attending: Emergency Medicine | Admitting: Emergency Medicine

## 2019-09-16 DIAGNOSIS — Y939 Activity, unspecified: Secondary | ICD-10-CM | POA: Diagnosis not present

## 2019-09-16 DIAGNOSIS — Y929 Unspecified place or not applicable: Secondary | ICD-10-CM | POA: Insufficient documentation

## 2019-09-16 DIAGNOSIS — S0990XA Unspecified injury of head, initial encounter: Secondary | ICD-10-CM | POA: Insufficient documentation

## 2019-09-16 DIAGNOSIS — Y999 Unspecified external cause status: Secondary | ICD-10-CM | POA: Diagnosis not present

## 2019-09-16 DIAGNOSIS — X58XXXA Exposure to other specified factors, initial encounter: Secondary | ICD-10-CM | POA: Diagnosis not present

## 2019-09-16 DIAGNOSIS — Z5321 Procedure and treatment not carried out due to patient leaving prior to being seen by health care provider: Secondary | ICD-10-CM | POA: Insufficient documentation

## 2019-09-16 NOTE — ED Notes (Signed)
Called X 1, no answer. Pt not found in lobby, outside or in bathroom

## 2019-09-17 ENCOUNTER — Telehealth: Payer: Self-pay | Admitting: *Deleted

## 2019-09-17 NOTE — Telephone Encounter (Signed)
Email sent to  Internal Medicine to  request  NP appointment . °  °                             Sheala Dosh °                                 PEC °                             [phone]       ° °

## 2019-10-16 DIAGNOSIS — Z114 Encounter for screening for human immunodeficiency virus [HIV]: Secondary | ICD-10-CM | POA: Diagnosis not present

## 2019-10-16 DIAGNOSIS — E611 Iron deficiency: Secondary | ICD-10-CM | POA: Diagnosis not present

## 2019-10-16 DIAGNOSIS — Z0000 Encounter for general adult medical examination without abnormal findings: Secondary | ICD-10-CM | POA: Diagnosis not present

## 2019-10-16 DIAGNOSIS — N96 Recurrent pregnancy loss: Secondary | ICD-10-CM | POA: Diagnosis not present

## 2019-10-16 DIAGNOSIS — Z1159 Encounter for screening for other viral diseases: Secondary | ICD-10-CM | POA: Diagnosis not present

## 2019-10-27 DIAGNOSIS — H5213 Myopia, bilateral: Secondary | ICD-10-CM | POA: Diagnosis not present

## 2019-10-28 ENCOUNTER — Ambulatory Visit: Payer: Medicaid Other | Admitting: Family Medicine

## 2019-10-28 ENCOUNTER — Other Ambulatory Visit: Payer: Self-pay

## 2019-10-28 ENCOUNTER — Encounter: Payer: Self-pay | Admitting: Family Medicine

## 2019-10-28 DIAGNOSIS — B977 Papillomavirus as the cause of diseases classified elsewhere: Secondary | ICD-10-CM | POA: Diagnosis not present

## 2019-10-28 DIAGNOSIS — Z113 Encounter for screening for infections with a predominantly sexual mode of transmission: Secondary | ICD-10-CM

## 2019-10-28 MED ORDER — IMIQUIMOD 5 % EX CREA: TOPICAL_CREAM | CUTANEOUS | 3 refills | Status: DC

## 2019-10-28 NOTE — Progress Notes (Signed)
  [facility] Department STI clinic/screening visit  Subjective:  Lindsey Velazquez is a 29 y.o. female being seen today for an STI screening visit. The patient reports they do have symptoms.  Patient reports that they do not desire a pregnancy in the next year.   They reported they are not interested in discussing contraception today.  Patient's last menstrual period was 10/24/2019.   Patient has the following medical conditions:   Patient Active Problem List   Diagnosis Date Noted  . Acute blood loss anemia 10/30/2017  . Varicella, non-immune 10/11/2017  . Neutropenia (HCC) 12/09/2013  . Depressive disorder 09/11/2012  . Generalized anxiety disorder 09/11/2012  . H/O premature delivery 09/13/2011    No chief complaint on file.   HPI  Patient reports she is here today for HPV treatment.  States that she was referred to [facility] 2020 for evaluation of the HPV lesions.  She didn't have insurance at that time and didn't keep the appointment.  She now has Medicaid and would like to have a referral C;lient declines exam, screening tests and bloodwork today Last HIV test per patient/review of record was 2018 Patient reports last pap was 2017.   See flowsheet for further details and programmatic requirements.    The following portions of the patient's history were reviewed and updated as appropriate: allergies, current medications, past medical history, past social history, past surgical history and problem list.  Objective:  There were no vitals filed for this visit.  Physical Exam Genitourinary:    Comments: Bilateral labia fold-  numerous HPV lesions from the clitoral hood to the introitus opening.       Assessment and Plan:  Lindsey Velazquez is a 29 y.o. female presenting to the [facility] Department for STI screening  1. Screening examination for venereal disease Declines screening tests and bloodwork  2. Human papilloma virus Lindsey Velazquez  29 y.o. comes into clinic today for cryotx for HPV.  Denies any problems with previous treatment.   Reports that the HPV have not been treated in about 1 year.  Desires to proceed with cryotx today. WDWN female in NAD, A&O x 3; skin is warm and dry with HPV.  No edema, erythema or ulcerative lesions noted.  Due to the large number of HPV lesions cryo treatment to HPV in 5-6 freeze/thaw cycles to treat the entire area.  Patient tolerated procedure well. Reviewed with patient after care instructions and when to call clinic. No sex until after area has completely healed. RTC in 10-14 days for next treatment if needed.  Client requested the wart cream for treatment at home.  She plans to make an appointe with [facility] (her PCP) her HPV treatment options.   - imiquimod (ALDARA) 5 % cream; Apply topically 3 (three) times a week.  Dispense: 12 each; Refill: 3  Co to use condoms always.   No follow-ups on file.  No future appointments.  Larene Pickett, FNP

## 2019-11-10 DIAGNOSIS — H52223 Regular astigmatism, bilateral: Secondary | ICD-10-CM | POA: Diagnosis not present

## 2019-12-07 ENCOUNTER — Inpatient Hospital Stay: Payer: Medicaid Other

## 2019-12-07 ENCOUNTER — Inpatient Hospital Stay: Payer: Medicaid Other | Admitting: Oncology

## 2019-12-11 ENCOUNTER — Inpatient Hospital Stay: Payer: Medicaid Other | Admitting: Oncology

## 2019-12-11 ENCOUNTER — Inpatient Hospital Stay: Payer: Medicaid Other

## 2019-12-14 ENCOUNTER — Inpatient Hospital Stay: Payer: Medicaid Other | Attending: Oncology | Admitting: Oncology

## 2019-12-14 ENCOUNTER — Encounter: Payer: Self-pay | Admitting: Oncology

## 2019-12-14 DIAGNOSIS — Z87891 Personal history of nicotine dependence: Secondary | ICD-10-CM

## 2019-12-14 DIAGNOSIS — N96 Recurrent pregnancy loss: Secondary | ICD-10-CM

## 2019-12-14 DIAGNOSIS — Z809 Family history of malignant neoplasm, unspecified: Secondary | ICD-10-CM

## 2019-12-14 DIAGNOSIS — D509 Iron deficiency anemia, unspecified: Secondary | ICD-10-CM

## 2019-12-14 DIAGNOSIS — D6861 Antiphospholipid syndrome: Secondary | ICD-10-CM

## 2019-12-14 DIAGNOSIS — R772 Abnormality of alphafetoprotein: Secondary | ICD-10-CM | POA: Diagnosis not present

## 2019-12-17 ENCOUNTER — Encounter: Payer: Medicaid Other | Admitting: Oncology

## 2019-12-17 ENCOUNTER — Other Ambulatory Visit: Payer: Medicaid Other

## 2019-12-17 NOTE — Progress Notes (Addendum)
I connected with Lindsey Velazquez on 12/17/19 at  1:00 PM EDT by video enabled telemedicine visit and verified that I am speaking with the correct person using two identifiers.   I discussed the limitations, risks, security and privacy concerns of performing an evaluation and management service by telemedicine and the availability of in-person appointments. I also discussed with the patient that there may be a patient responsible charge related to this service. The patient expressed understanding and agreed to proceed.  Other persons participating in the visit and their role in the encounter:  none  Patient's location:  home Provider's location:  Work   Reason for referral: History of multiple abortions and positive beta-2 glycoprotein Referring provider: Dr. Nemiah Commander   History of present illness: Patient is a 29 year old African-American female referred for elevated beta-2 glycoprotein levels in the setting of recurrent miscarriages.  Patient had her first child about 12 years ago but was born prematurely at 66 to 30 weeks and did not have any known birth defects.  Her second child was born 10 years ago without any adverse events.  8 years ago she had a miscarriage at about 10 weeks.  Third normal pregnancy 7 years ago.  She had a miscarriage first trimester 2 years ago as well as in January 2021.  She is interested in further pregnancies.  Dr. Nemiah Commander had ordered Antiphospholipid antibody syndrome work-up which showed a normal PT PTT INR but showed an elevated IgG beta-2 glycoprotein level at 88.  IgA beta-2 glycoprotein was elevated at greater than 150 but is not considered significant.  Anticardiolipin antibody was not significant and no lupus anticoagulant was detected.  Patient otherwise reports feeling well and denies any complaints at this time other than mild fatigue. She is also been referred to [facility]     Review of Systems  Constitutional: Positive for malaise/fatigue.  Negative for chills, fever and weight loss.  HENT: Negative for congestion, ear discharge and nosebleeds.   Eyes: Negative for blurred vision.  Respiratory: Negative for cough, hemoptysis, sputum production, shortness of breath and wheezing.   Cardiovascular: Negative for chest pain, palpitations, orthopnea and claudication.  Gastrointestinal: Negative for abdominal pain, blood in stool, constipation, diarrhea, heartburn, melena, nausea and vomiting.  Genitourinary: Negative for dysuria, flank pain, frequency, hematuria and urgency.  Musculoskeletal: Negative for back pain, joint pain and myalgias.  Skin: Negative for rash.  Neurological: Negative for dizziness, tingling, focal weakness, seizures, weakness and headaches.  Endo/Heme/Allergies: Does not bruise/bleed easily.  Psychiatric/Behavioral: Negative for depression and suicidal ideas. The patient does not have insomnia.     Allergies  Allergen Reactions  . Clindamycin/Lincomycin Rash  . Penicillins Hives and Rash    Has patient had a PCN reaction causing immediate rash, facial/tongue/throat swelling, SOB or lightheadedness with hypotension: Yes Has patient had a PCN reaction causing severe rash involving mucus membranes or skin necrosis: No Has patient had a PCN reaction that required hospitalization No Has patient had a PCN reaction occurring within the last 10 years: No If all of the above answers are "NO", then may proceed with Cephalosporin use.     Past Medical History:  Diagnosis Date  . Anxiety   . Asthma    childhood  . Depression   . Eczema   . HPV in female     Past Surgical History:  Procedure Laterality Date  . NO PAST SURGERIES      Social History   Socioeconomic History  . Marital status: Single  Spouse name: Not on file  . Number of children: Not on file  . Years of education: Not on file  . Highest education level: Not on file  Occupational History  . Not on file  Tobacco Use  . Smoking  status: Former Smoker    Packs/day: 0.50    Types: Cigarettes  . Smokeless tobacco: Never Used  Substance and Sexual Activity  . Alcohol use: Yes  . Drug use: No  . Sexual activity: Yes    Birth control/protection: None  Other Topics Concern  . Not on file  Social History Narrative  . Not on file   Social Determinants of Health   Financial Resource Strain:   . Difficulty of Paying Living Expenses: Not on file  Food Insecurity:   . Worried About Programme researcher, broadcasting/film/video in the Last Year: Not on file  . Ran Out of Food in the Last Year: Not on file  Transportation Needs:   . Lack of Transportation (Medical): Not on file  . Lack of Transportation (Non-Medical): Not on file  Physical Activity:   . Days of Exercise per Week: Not on file  . Minutes of Exercise per Session: Not on file  Stress:   . Feeling of Stress : Not on file  Social Connections:   . Frequency of Communication with Friends and Family: Not on file  . Frequency of Social Gatherings with Friends and Family: Not on file  . Attends Religious Services: Not on file  . Active Member of Clubs or Organizations: Not on file  . Attends Banker Meetings: Not on file  . Marital Status: Not on file  Intimate Partner Violence:   . Fear of Current or Ex-Partner: Not on file  . Emotionally Abused: Not on file  . Physically Abused: Not on file  . Sexually Abused: Not on file    Family History  Problem Relation Age of Onset  . Diabetes Mother   . Hypertension Mother   . Heart disease Mother   . Thyroid disease Sister   . Diabetes Sister   . Asthma Father   . Diabetes Maternal Aunt   . Cancer Paternal Uncle      No results found.  No images are attached to the encounter.   CMP Latest Ref Rng & Units 10/30/2017  Glucose 70 - 99 mg/dL 89  BUN 6 - 20 mg/dL 8  Creatinine 2.13 - 0.86 mg/dL 5.78  Sodium 469 - 629 mmol/L 137  Potassium 3.5 - 5.1 mmol/L 3.3(L)  Chloride 98 - 111 mmol/L 106  CO2 22 - 32  mmol/L 19(L)  Calcium 8.9 - 10.3 mg/dL 5.2(W)  Total Protein 6.5 - 8.1 g/dL 7.1  Total Bilirubin 0.3 - 1.2 mg/dL 4.1(L)  Alkaline Phos 38 - 126 U/L 45  AST 15 - 41 U/L 17  ALT 0 - 44 U/L 9   CBC Latest Ref Rng & Units 02/10/2019  WBC 4.0 - 10.5 K/uL 6.3  Hemoglobin 12.0 - 15.0 g/dL 2.4(M)  Hematocrit 36 - 46 % 29.3(L)  Platelets 150 - 400 K/uL 341     Observation/objective: Appears in no acute distress over video visit today.  Breathing is nonlabored  Assessment and plan: Patient is a 29 year old female referred for evaluation for possible antiphospholipid antibody syndrome in the setting of recurrent Pregnancy losses   Based on her history of 1 premature delivery as well as 3 subsequent miscarriages she does qualify for possible obstetric antiphospholipid antibody syndrome in the  presence of elevated beta-2 glycoprotein level at 88.  However she would need another level to be checked 12 weeks from her first value to confirm the diagnosis.  We will plan to get those labs in the first week of December and I will see her following that in person about a week or 10 days later.  I have recommended that patient should hold off on getting pregnant until these results are back.  I do not recommend any anticoagulation at this time until we make a confirmed diagnosis of obstetric antiphospholipid antibody syndrome.  Patient noted to have microcytic iron deficiency anemia that was mild with a hemoglobin of 11 and a low ferritin of 7.  Of asked her to take oral iron 325 mg once or twice a day as tolerated I will repeat her iron studies again in 6 weeks.  Follow-up instructions: As above  I discussed the assessment and treatment plan with the patient. The patient was provided an opportunity to ask questions and all were answered. The patient agreed with the plan and demonstrated an understanding of the instructions.   The patient was advised to call back or seek an in-person evaluation if the  symptoms worsen or if the condition fails to improve as anticipated.   Visit Diagnosis: 1. 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 Owens Shark, MD, MPH [facility] at [facility] Tel- [phone] 12/17/2019 9:34 PM

## 2019-12-21 NOTE — Addendum Note (Signed)
Addended by: Creig Hines on: 12/21/2019 04:39 PM   Modules accepted: Orders

## 2020-01-14 ENCOUNTER — Inpatient Hospital Stay: Payer: Medicaid Other | Attending: Oncology

## 2020-01-18 ENCOUNTER — Other Ambulatory Visit: Payer: Self-pay

## 2020-01-18 NOTE — Patient Outreach (Addendum)
Care Coordination  01/18/2020  [name] 10-30-1990 [id number]   Medicaid Managed Care   Unsuccessful Outreach Note  01/18/2020 Name: Lindsey Velazquez MRN: [id number] DOB: [birth year]  Referred by: [facility] Reason for referral : High Risk Managed Medicaid (HR MM List - depression and anxiety treatment needs)   Lindsey Velazquez is enrolled in a Managed Medicaid Health Plan: Yes  An unsuccessful telephone outreach was attempted today. The patient was referred to the case management team for assistance with care management and care coordination.   Follow Up Plan: A HIPAA compliant phone message was left for the patient providing contact information and requesting a return call.  The Managed Medicaid care management team will reach out to the patient again over the next 7 days.   Roselyn Bering, BSW, MSW, LCSW Social Work Case Production designer, theatre/television/film - [facility] Managed Care [facility]  [facility]  Direct Dial: [phone]

## 2020-01-18 NOTE — Patient Instructions (Addendum)
  Medicaid Managed Care   Unsuccessful Outreach Note  01/18/2020 Name: Lindsey Velazquez MRN: [id number] DOB: May 24, 1990  Referred by: [facility] Reason for referral : High Risk Managed Medicaid (HR MM List - depression and anxiety treatment needs)   Lindsey Velazquez is enrolled in a Managed Medicaid Health Plan: Yes  An unsuccessful telephone outreach was attempted today. The patient was referred to the case management team for assistance with care management and care coordination.   Follow Up Plan: A HIPAA compliant phone message was left for the patient providing contact information and requesting a return call.  The Managed Medicaid care management team will reach out to the patient again over the next 7 days.   Roselyn Bering, BSW, MSW, LCSW Social Work Case Production designer, theatre/television/film - [facility] Managed Care [facility]  [facility]  Direct Dial: [phone]

## 2020-01-22 ENCOUNTER — Telehealth: Payer: Self-pay | Admitting: Oncology

## 2020-01-22 ENCOUNTER — Inpatient Hospital Stay: Payer: Medicaid Other | Admitting: Oncology

## 2020-01-22 NOTE — Telephone Encounter (Signed)
Called pt to reschedule missed appointment

## 2020-01-25 ENCOUNTER — Telehealth: Payer: Self-pay | Admitting: Clinical Medical Laboratory

## 2020-01-25 NOTE — Telephone Encounter (Signed)
I left a message for Lindsey Velazquez to return my call so I can get her appt rescheduled with the MM Child psychotherapist. I left my name and number.I will reach out again in the next 7-14 days.

## 2020-01-27 ENCOUNTER — Other Ambulatory Visit: Payer: Self-pay

## 2020-01-27 NOTE — Patient Instructions (Signed)
Thank you for taking time to speak with me today about care coordination and care management services available to you at no cost as part of your Medicaid benefit. These services are voluntary. Our team is available to provide assistance regarding your health care needs at any time. Please do not hesitate to reach out to me if we can be of service to you at any time in the future.  ° °Abu Heavin, BSW, MSW, LCSW °Social Work Case Manager - Medicaid Managed Care °[place]   [facility]  °Direct Dial: [phone] ° ° °

## 2020-01-27 NOTE — Patient Outreach (Signed)
Care Coordination  01/27/2020  Gwendy Boeder [birth year] [id number]   Medicaid Managed Care   Unsuccessful Outreach Note  01/27/2020 Name: Leeah Politano MRN: [id number] DOB: 07/20/1990  Referred by: [facility] Reason for referral : High Risk Managed Medicaid (Depression and anxiety needs)   A second unsuccessful telephone outreach was attempted today. The patient was referred to the case management team for assistance with care management and care coordination.   Follow Up Plan: A Managed Medicaid Care Team member will follow up with the patient in 14 days.  Roselyn Bering, BSW, MSW, LCSW Social Work Case Production designer, theatre/television/film - [facility] Managed Care [facility]  [facility]  Direct Dial: [phone]

## 2020-01-27 NOTE — Patient Instructions (Signed)
      Visit Information  Lindsey Velazquez  - as a part of your Medicaid benefit, you are eligible for care management and care coordination services at no cost or copay. I was unable to reach you by phone today but would be happy to help you with your health related needs. Please feel free to call me @ [phone].   A member of the Managed Medicaid care management team will reach out to you again over the next 7 days.   Roselyn Bering, BSW, MSW, LCSW Social Work Case Production designer, theatre/television/film - [facility] Managed Care [facility]  [facility]  Direct Dial: [phone]

## 2020-01-27 NOTE — Patient Outreach (Signed)
Care Coordination  01/27/2020  Jolanta Cabeza 05-16-1990 [id number]  Kandace Elrod was referred to the [facility] Managed Care High Risk team for assistance with care coordination and care management services. Care coordination/care management services as part of the Medicaid benefit was offered to the patient today. The patient declined assistance offered today.   Plan: The Medicaid Managed Care High Risk team is available at any time in the future to assist with care coordination/care management services upon referral.   Roselyn Bering, BSW, MSW, LCSW Social Work Case Production designer, theatre/television/film - [facility] Managed Care [facility]  [facility]  Direct Dial: [phone]

## 2020-05-10 ENCOUNTER — Ambulatory Visit: Payer: Medicaid Other

## 2020-07-01 ENCOUNTER — Ambulatory Visit (LOCAL_COMMUNITY_HEALTH_CENTER): Payer: Medicaid Other

## 2020-07-01 ENCOUNTER — Other Ambulatory Visit: Payer: Self-pay

## 2020-07-01 DIAGNOSIS — Z111 Encounter for screening for respiratory tuberculosis: Secondary | ICD-10-CM

## 2020-07-04 ENCOUNTER — Ambulatory Visit (LOCAL_COMMUNITY_HEALTH_CENTER): Payer: Medicaid Other

## 2020-07-04 DIAGNOSIS — Z111 Encounter for screening for respiratory tuberculosis: Secondary | ICD-10-CM

## 2020-07-04 LAB — TB SKIN TEST
Induration: 0 mm
TB Skin Test: NEGATIVE

## 2020-08-25 IMAGING — US US OB COMP LESS 14 WK
1 series · 14 of 28 positions shown · non-contrast
Comparison: 10/15/2017

CLINICAL DATA: Suspected miscarriage.

EXAM:
OBSTETRIC <14 WK US AND TRANSVAGINAL OB US
TECHNIQUE: Both transabdominal and transvaginal ultrasound examinations were
performed for complete evaluation of the gestation as well as the
maternal uterus, adnexal regions, and pelvic cul-de-sac.
Transvaginal technique was performed to assess early pregnancy.

[Series 1: us ob comp less 14 wk · 0.20mm/px · 14 of 47 slices shown]
[im 2/47]
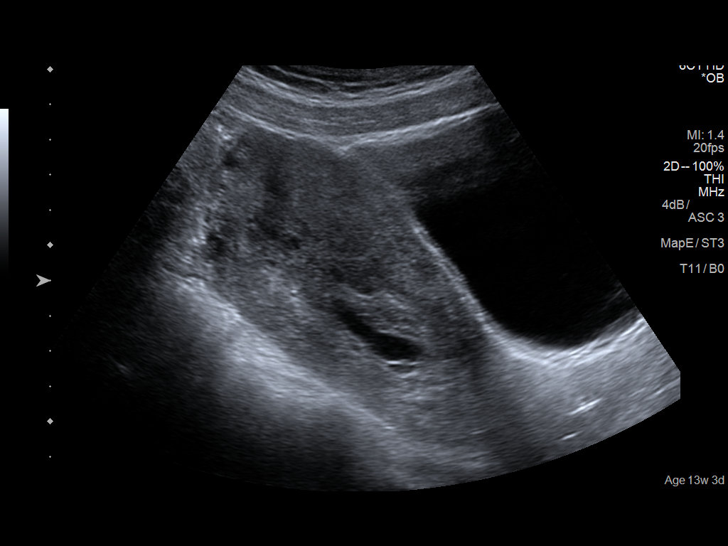
[im 6/47]
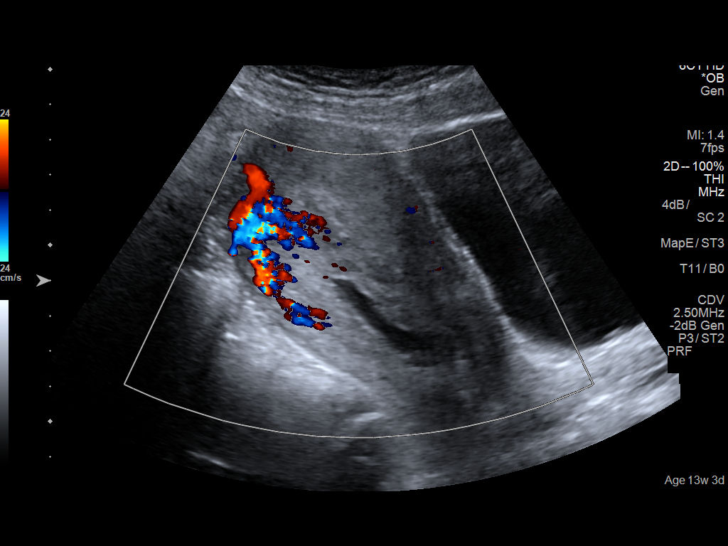
[im 9/47]
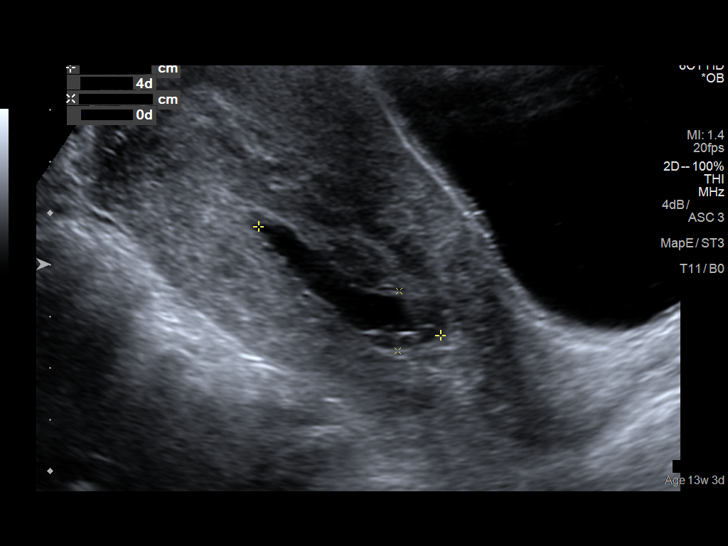
[im 12/47]
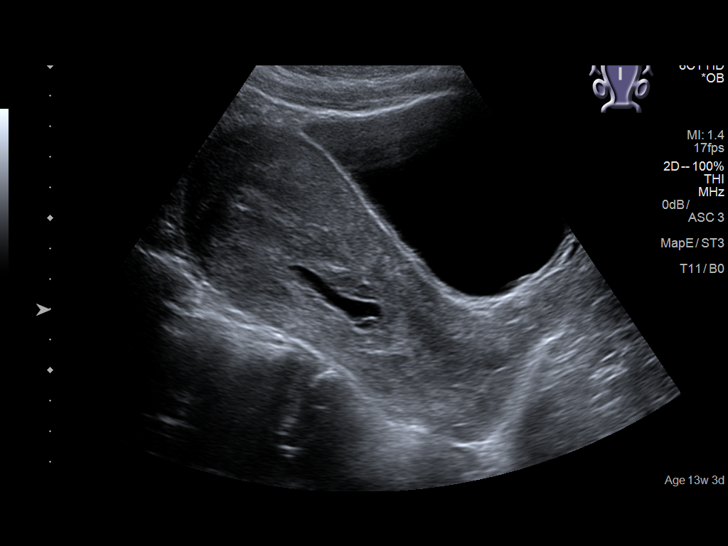
[im 16/47]
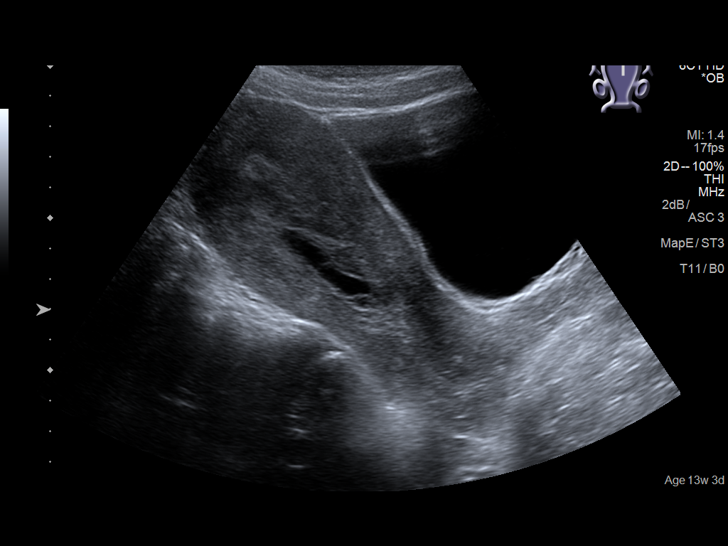
[im 19/47]
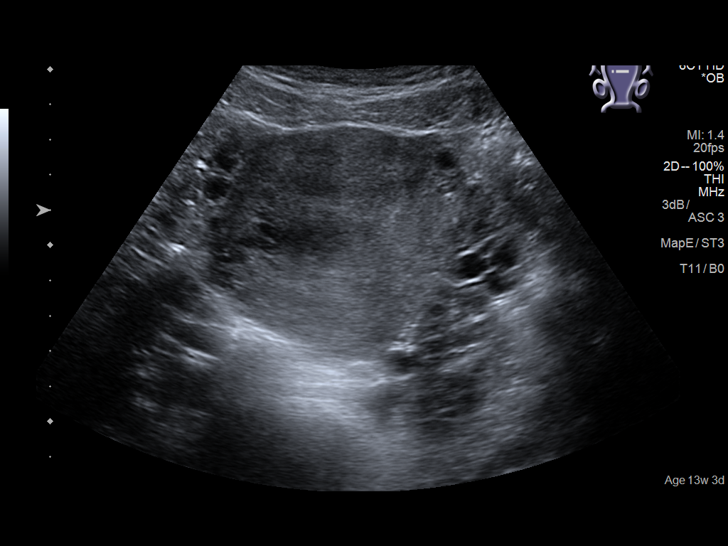
[im 23/47]
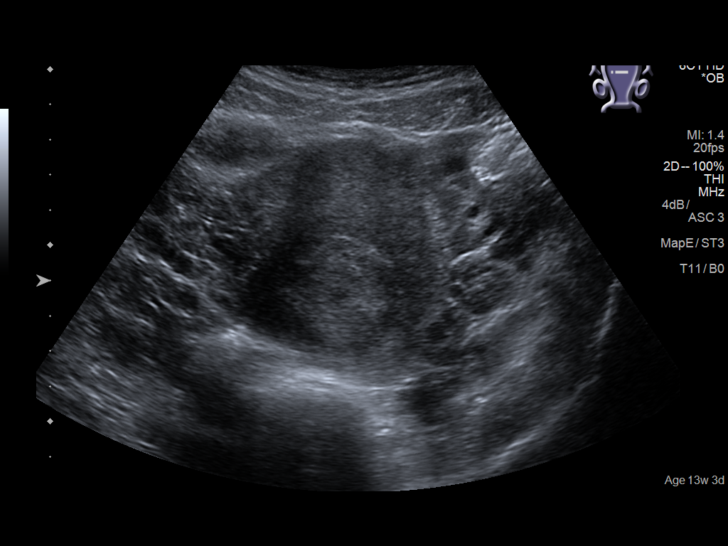
[im 26/47]
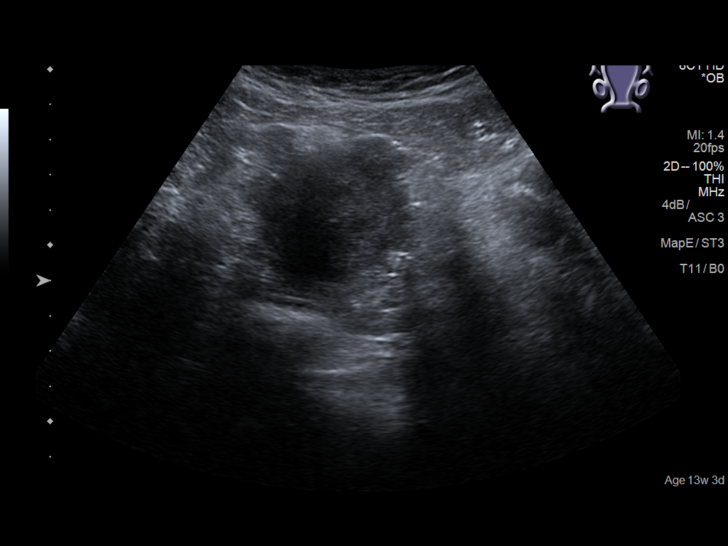
[im 29/47]
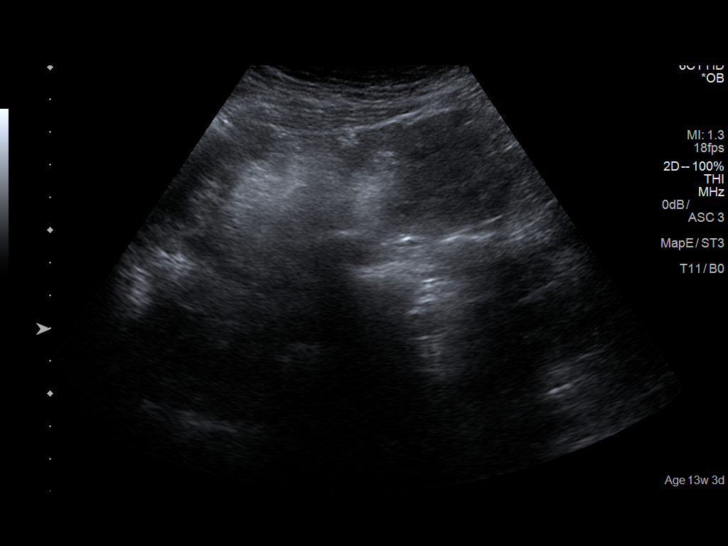
[im 33/47]
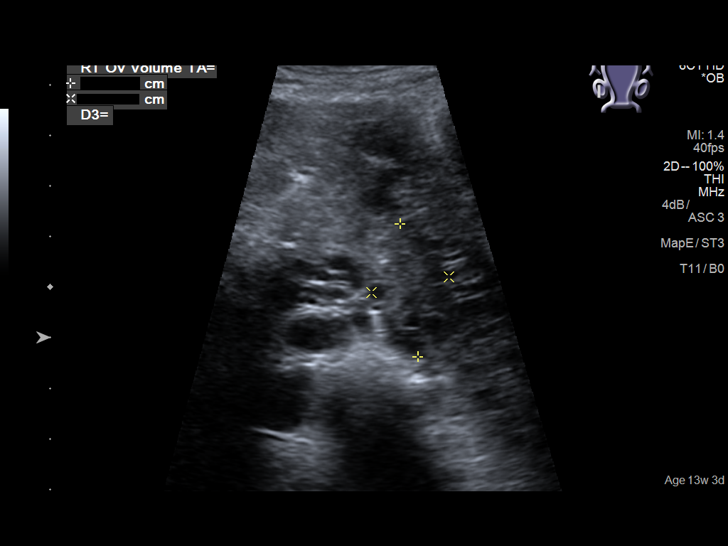
[im 36/47]
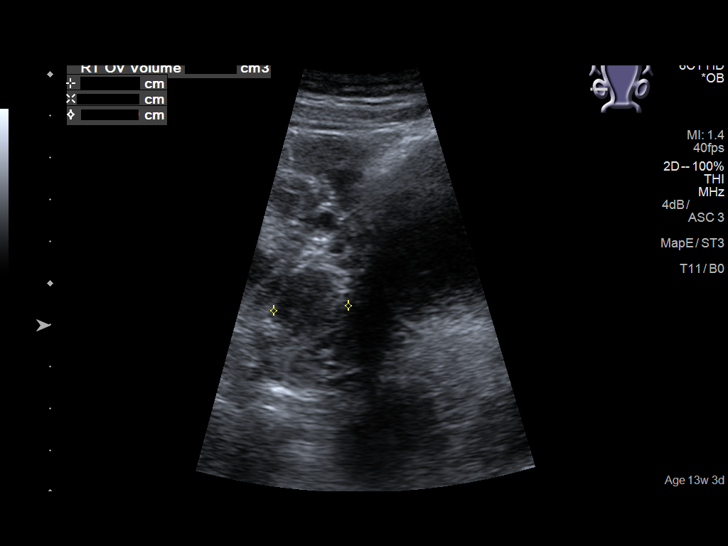
[im 40/47]
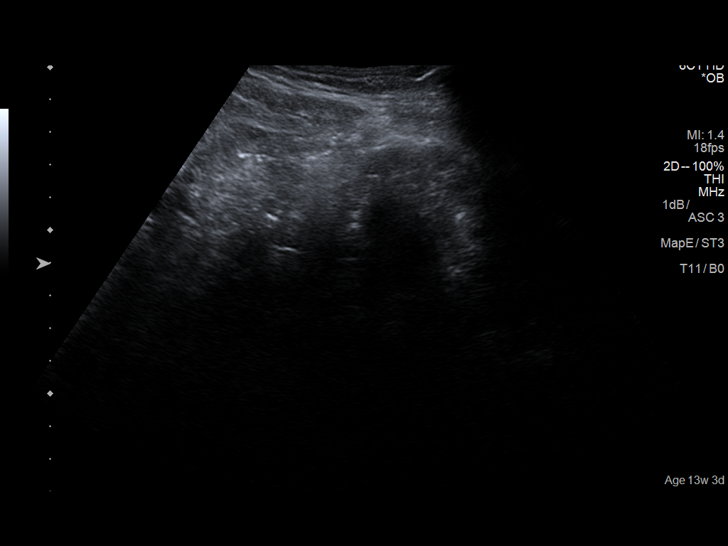
[im 43/47]
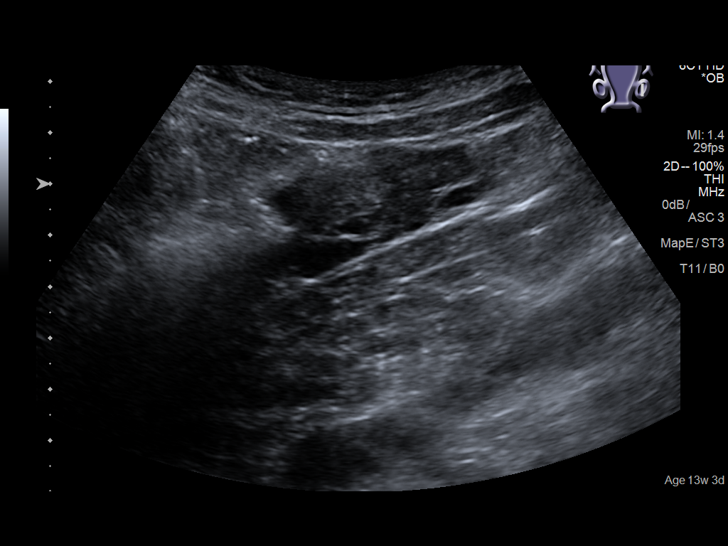
[im 47/47]
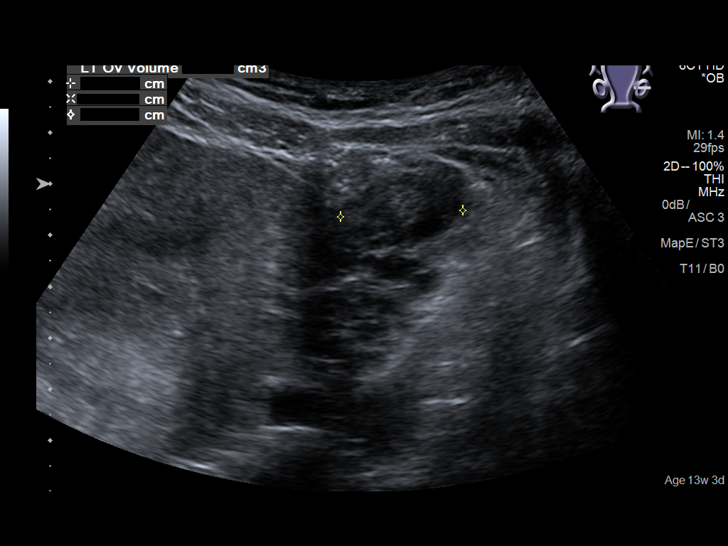

[14 of 28 positions shown; findings below may reference images not displayed]

FINDINGS: Intrauterine gestational sac: Single. Abnormally elongated. The
endometrium is thickened to 2.8 cm and hypervascular.

Yolk sac:  Not Visualized.

Embryo:  Not Visualized.

MSD: 26.4 mm   7 w   for d

Scratch

Subchorionic hemorrhage:  None visualized.

Maternal uterus/adnexae: Normal appearance of the ovaries.
IMPRESSION: Elongated hypoechoic structure within the lower uterine segment may
represent abnormally appearing vacant intrauterine gestational sac
versus fluid collection.

Thickened heterogeneous endometrium. Retained placental products
cannot be excluded with this appearance.

Normal appearance of the ovaries.

## 2020-12-21 ENCOUNTER — Other Ambulatory Visit: Payer: Medicaid Other

## 2021-03-17 ENCOUNTER — Ambulatory Visit: Payer: Medicaid Other

## 2021-05-18 ENCOUNTER — Other Ambulatory Visit: Payer: Self-pay

## 2021-05-18 ENCOUNTER — Emergency Department (HOSPITAL_COMMUNITY)
Admission: EM | Admit: 2021-05-18 | Discharge: 2021-05-19 | Discharge status: Against Medical Advice | Payer: No Typology Code available for payment source | Attending: Emergency Medicine | Admitting: Emergency Medicine

## 2021-05-18 ENCOUNTER — Encounter (HOSPITAL_COMMUNITY): Payer: Self-pay

## 2021-05-18 DIAGNOSIS — F10288 Alcohol dependence with other alcohol-induced disorder: Secondary | ICD-10-CM | POA: Insufficient documentation

## 2021-05-18 DIAGNOSIS — Z5321 Procedure and treatment not carried out due to patient leaving prior to being seen by health care provider: Secondary | ICD-10-CM | POA: Insufficient documentation

## 2021-05-18 DIAGNOSIS — F1490 Cocaine use, unspecified, uncomplicated: Secondary | ICD-10-CM | POA: Insufficient documentation

## 2021-05-18 LAB — COMPREHENSIVE METABOLIC PANEL
ALT: 13 U/L (ref 0–44)
AST: 29 U/L (ref 15–41)
Albumin: 3.8 g/dL (ref 3.5–5.0)
Alkaline Phosphatase: 49 U/L (ref 38–126)
Anion gap: 10 (ref 5–15)
BUN: <5 mg/dL — ABNORMAL LOW (ref 6–20)
CO2: 22 mmol/L (ref 22–32)
Calcium: 8.7 mg/dL — ABNORMAL LOW (ref 8.9–10.3)
Chloride: 105 mmol/L (ref 98–111)
Creatinine, Ser: 0.66 mg/dL (ref 0.44–1.00)
GFR, Estimated: >60 mL/min (ref >60)
Glucose, Bld: 82 mg/dL (ref 70–99)
Potassium: 2.6 mmol/L — CL (ref 3.5–5.1)
Sodium: 137 mmol/L (ref 135–145)
Total Bilirubin: 0.2 mg/dL — ABNORMAL LOW (ref 0.3–1.2)
Total Protein: 8 g/dL (ref 6.5–8.1)

## 2021-05-18 LAB — CBC
HCT: 31.4 % — ABNORMAL LOW (ref 36.0–46.0)
Hemoglobin: 9.9 g/dL — ABNORMAL LOW (ref 12.0–15.0)
MCH: 27.6 pg (ref 26.0–34.0)
MCHC: 31.5 g/dL (ref 30.0–36.0)
MCV: 87.5 fL (ref 80.0–100.0)
Platelets: 368 10*3/uL (ref 150–400)
RBC: 3.59 MIL/uL — ABNORMAL LOW (ref 3.87–5.11)
RDW: 19 % — ABNORMAL HIGH (ref 11.5–15.5)
WBC: 5.4 10*3/uL (ref 4.0–10.5)
nRBC: 0 % (ref 0.0–0.2)

## 2021-05-18 LAB — RAPID URINE DRUG SCREEN, HOSP PERFORMED
Amphetamines: NOT DETECTED
Barbiturates: NOT DETECTED
Benzodiazepines: NOT DETECTED
Cocaine: POSITIVE — AB
Opiates: NOT DETECTED
Tetrahydrocannabinol: NOT DETECTED

## 2021-05-18 LAB — I-STAT BETA HCG BLOOD, ED (MC, WL, AP ONLY): I-stat hCG, quantitative: <5 m[IU]/mL (ref <5)

## 2021-05-18 LAB — ETHANOL: Alcohol, Ethyl (B): 159 mg/dL — ABNORMAL HIGH (ref <10)

## 2021-05-18 NOTE — ED Triage Notes (Addendum)
Pt reports she has an alcohol problem, usually drinks 5 days a week and can drink a pint of liquor by herself. Last drink was today. Denies SI/HI. Pt does admit to using cocaine yesterday. ?

## 2021-05-18 NOTE — ED Provider Triage Note (Signed)
Emergency Medicine Provider Triage Evaluation Note ? ?Lindsey Velazquez , a 31 y.o. female  was evaluated in triage.  Pt complains of alcohol problem onset today.  Patient is she is typically drinks a pint of liquor approximately 2-3 times a week.  She notes today prior to arrival she had multiple shots.  Patient also notes that she is snorting cocaine on yesterday. Denies chest pain, shortness of breath. ? ? ?Review of Systems  ?Positive: As per HPI above ?Negative:  ? ?Physical Exam  ?BP (!) 138/99 (BP Location: Right Arm)   Pulse 73   Temp 98.9 ?F (37.2 ?C) (Oral)   Resp 16   LMP 05/15/2021 (Approximate)   SpO2 99%  ?Gen:   Awake, no distress   ?Resp:  Normal effort  ?MSK:   Moves extremities without difficulty  ?Other:  No chest wall tenderness to palpation.  No abdominal tenderness to palpation. ? ?Medical Decision Making  ?Medically screening exam initiated at 6:03 PM.  Appropriate orders placed.  Velisha Wieber was informed that the remainder of the evaluation will be completed by another provider, this initial triage assessment does not replace that evaluation, and the importance of remaining in the ED until their evaluation is complete. ?  ?Zorian Gunderman A, PA-C ?05/18/21 1811 ? ?

## 2021-05-19 NOTE — ED Notes (Signed)
Pt seen walking out doors with no return.  ?

## 2021-06-22 ENCOUNTER — Emergency Department (HOSPITAL_COMMUNITY): Admission: EM | Admit: 2021-06-22 | Payer: Medicaid Other | Source: Home / Self Care

## 2021-11-17 ENCOUNTER — Ambulatory Visit (HOSPITAL_COMMUNITY)
Admission: EM | Admit: 2021-11-17 | Discharge: 2021-11-17 | Disposition: A | Discharge status: Home / Self Care | Payer: No Typology Code available for payment source

## 2021-11-22 ENCOUNTER — Other Ambulatory Visit (HOSPITAL_COMMUNITY)
Admission: EM | Admit: 2021-11-22 | Discharge: 2021-11-30 | Disposition: A | Discharge status: Home / Self Care | Payer: No Typology Code available for payment source | Attending: Family | Admitting: Family

## 2021-11-22 DIAGNOSIS — F411 Generalized anxiety disorder: Secondary | ICD-10-CM | POA: Diagnosis not present

## 2021-11-22 DIAGNOSIS — Z1152 Encounter for screening for COVID-19: Secondary | ICD-10-CM | POA: Diagnosis not present

## 2021-11-22 DIAGNOSIS — F1010 Alcohol abuse, uncomplicated: Secondary | ICD-10-CM | POA: Insufficient documentation

## 2021-11-22 DIAGNOSIS — F1090 Alcohol use, unspecified, uncomplicated: Secondary | ICD-10-CM

## 2021-11-22 DIAGNOSIS — F32A Depression, unspecified: Secondary | ICD-10-CM | POA: Insufficient documentation

## 2021-11-22 DIAGNOSIS — Z79899 Other long term (current) drug therapy: Secondary | ICD-10-CM | POA: Insufficient documentation

## 2021-11-22 LAB — RESP PANEL BY RT-PCR (FLU A&B, COVID) ARPGX2
Influenza A by PCR: NEGATIVE
Influenza B by PCR: NEGATIVE
SARS Coronavirus 2 by RT PCR: NEGATIVE

## 2021-11-22 LAB — POCT URINE DRUG SCREEN - MANUAL ENTRY (I-SCREEN)
POC Amphetamine UR: NOT DETECTED
POC Buprenorphine (BUP): NOT DETECTED
POC Cocaine UR: NOT DETECTED
POC Marijuana UR: NOT DETECTED
POC Methadone UR: NOT DETECTED
POC Methamphetamine UR: POSITIVE — AB
POC Morphine: NOT DETECTED
POC Oxazepam (BZO): NOT DETECTED
POC Oxycodone UR: NOT DETECTED
POC Secobarbital (BAR): NOT DETECTED

## 2021-11-22 LAB — POC SARS CORONAVIRUS 2 AG: SARSCOV2ONAVIRUS 2 AG: NEGATIVE

## 2021-11-22 LAB — POCT PREGNANCY, URINE: Preg Test, Ur: NEGATIVE

## 2021-11-22 MED ORDER — ALUM & MAG HYDROXIDE-SIMETH 200-200-20 MG/5ML PO SUSP: 30.0000 mL | ORAL | Status: DC | PRN

## 2021-11-22 MED ORDER — LORAZEPAM 1 MG PO TABS: 1.0000 mg | ORAL_TABLET | Freq: Four times a day (QID) | ORAL | Status: AC | PRN

## 2021-11-22 MED ORDER — LOPERAMIDE HCL 2 MG PO CAPS: 2.0000 mg | ORAL_CAPSULE | ORAL | Status: AC | PRN

## 2021-11-22 MED ORDER — ACETAMINOPHEN 325 MG PO TABS: 650.0000 mg | ORAL_TABLET | Freq: Four times a day (QID) | ORAL | Status: DC | PRN

## 2021-11-22 MED ORDER — LORAZEPAM 1 MG PO TABS
1.0000 mg | ORAL_TABLET | Freq: Two times a day (BID) | ORAL | Status: AC
  Administered 2021-11-24 (×2): 1 mg via ORAL
  Filled 2021-11-22 (×2): qty 1

## 2021-11-22 MED ORDER — THIAMINE HCL 100 MG/ML IJ SOLN
100.0000 mg | Freq: Once | INTRAMUSCULAR | Status: AC
  Administered 2021-11-22: 100 mg via INTRAMUSCULAR
  Filled 2021-11-22: qty 2

## 2021-11-22 MED ORDER — TRAZODONE HCL 50 MG PO TABS
50.0000 mg | ORAL_TABLET | Freq: Every evening | ORAL | Status: DC | PRN
Start: 2021-11-22 — End: 2021-11-30
  Administered 2021-11-23 – 2021-11-29 (×7): 50 mg via ORAL
  Filled 2021-11-22 (×7): qty 1

## 2021-11-22 MED ORDER — LORAZEPAM 1 MG PO TABS
1.0000 mg | ORAL_TABLET | Freq: Four times a day (QID) | ORAL | Status: AC
  Administered 2021-11-22 (×2): 1 mg via ORAL
  Filled 2021-11-22 (×3): qty 1

## 2021-11-22 MED ORDER — LORAZEPAM 1 MG PO TABS
1.0000 mg | ORAL_TABLET | Freq: Every day | ORAL | Status: AC
  Administered 2021-11-25: 1 mg via ORAL
  Filled 2021-11-22: qty 1

## 2021-11-22 MED ORDER — MAGNESIUM HYDROXIDE 400 MG/5ML PO SUSP: 30.0000 mL | Freq: Every day | ORAL | Status: DC | PRN

## 2021-11-22 MED ORDER — HYDROXYZINE HCL 25 MG PO TABS
25.0000 mg | ORAL_TABLET | Freq: Three times a day (TID) | ORAL | Status: DC | PRN
  Administered 2021-11-26: 25 mg via ORAL
  Filled 2021-11-22: qty 1

## 2021-11-22 MED ORDER — NICOTINE 14 MG/24HR TD PT24
14.0000 mg | MEDICATED_PATCH | Freq: Every day | TRANSDERMAL | Status: DC
  Administered 2021-11-23 – 2021-11-30 (×8): 14 mg via TRANSDERMAL
  Filled 2021-11-22 (×2): qty 1
  Filled 2021-11-22: qty 14
  Filled 2021-11-22 (×6): qty 1

## 2021-11-22 MED ORDER — LORAZEPAM 1 MG PO TABS
1.0000 mg | ORAL_TABLET | Freq: Three times a day (TID) | ORAL | Status: AC
  Administered 2021-11-23 (×3): 1 mg via ORAL
  Filled 2021-11-22 (×3): qty 1

## 2021-11-22 MED ORDER — THIAMINE MONONITRATE 100 MG PO TABS
100.0000 mg | ORAL_TABLET | Freq: Every day | ORAL | Status: DC
  Administered 2021-11-23 – 2021-11-30 (×8): 100 mg via ORAL
  Filled 2021-11-22 (×8): qty 1

## 2021-11-22 MED ORDER — ONDANSETRON 4 MG PO TBDP: 4.0000 mg | ORAL_TABLET | Freq: Four times a day (QID) | ORAL | Status: AC | PRN

## 2021-11-22 MED ORDER — ADULT MULTIVITAMIN W/MINERALS CH
1.0000 | ORAL_TABLET | Freq: Every day | ORAL | Status: DC
  Administered 2021-11-22 – 2021-11-30 (×9): 1 via ORAL
  Filled 2021-11-22 (×9): qty 1

## 2021-11-22 NOTE — ED Notes (Signed)
Pt is in the bed sleeping. Respirations are even and unlabored. No acute distress noted. Will continue to monitor for safety. 

## 2021-11-22 NOTE — ED Notes (Signed)
PHQ- 9 done and place in basket.

## 2021-11-22 NOTE — BH Assessment (Signed)
Lindsey Velazquez is routine. Seeking detox, referred from [facility]. Pt reports drinking 1-2 [facility] daily for the past few months. Pt has legal issues and her attorney recommended she seek treatment for alcohol use.  Pt denies SI, HI, AVH.

## 2021-11-22 NOTE — ED Notes (Signed)
Patient resting quietly in bed with eyes closed. Respirations equal and unlabored, skin warm and dry, NAD. No change in assessment or acuity. Q 15 minute safety checks remain in place.   

## 2021-11-22 NOTE — ED Notes (Signed)
Attempted blood draw x2 with no return or flash.

## 2021-11-22 NOTE — ED Provider Notes (Signed)
Facility Based Crisis Admission H&P  Date: 11/22/21 Patient Name: Lindsey Velazquez MRN: [id number] Chief Complaint:  Chief Complaint  Patient presents with   Alcohol Problem      Diagnoses:  Final diagnoses:  Alcohol use disorder  Depressive disorder  Generalized anxiety disorder    HPI: Lindsey Velazquez is a 31 year old female.  She presents voluntarily to [facility] mental health for walk-in assessment.  She is seeking residential alcohol use treatment.  She has spoken with administration at [facility] in [facility] who suggested she present to [facility] behavioral health for alcohol detox.  Patient's attorney suggested she seek alcohol use treatment.  She has been charged with DWI x2 approximately 3 years ago.  Her case is currently ongoing.  Patient's attorney believes that alcohol detox could benefit her upcoming court date outcome.  Recent stressors include alcohol use.  Patient reports daily use of alcohol for approximately 3 years.  She consumes an average of 2 drinks per day.  Most recent alcohol use this morning.  She denies substance use aside from alcohol.  She denies history of drinking until blacking out, denies history of alcohol-related seizure.  She endorses use of nicotine cigarettes daily.  Patient is assessed, face-to-face, by nurse practitioner.  She is alert and oriented, pleasant and cooperative during assessment.  She is seated, no apparent distress.  She presents with depressed mood, tearful affect, when discussing alcohol use disorder.  Lindsey Velazquez is committed to her sobriety today and states "I just want to get my life back on track."  She is not linked with outpatient psychiatry, no current medications.  Per medical record patient has a history of depressive disorder as well as generalized anxiety disorder.  She denies history of inpatient psychiatric hospitalization.  Denies family mental health history.  Shariah denies suicidal and homicidal ideations.   She denies history of nonsuicidal self-harm behavior, denies history of suicide attempt.  She denies auditory and visual hallucinations.  There is no evidence of delusional thought content and no indication that patient is responding to internal stimuli.  She resides, with her children, in [place] with her children's aunt.  Her 4 children, ages 85, 79, 45 and 78 years old are currently with their aunt.  She denies access to weapons.  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  She endorses average sleep and appetite.  Patient offered support and encouragement.  Reviewed treatment plan to include facility based crisis unit admission as well as current medications, patient verbalizes understanding and agreement with plan.  PHQ 2-9:   [place] from 05/18/2021 in [place] [facility]        Total Time spent with patient: 30 minutes  Musculoskeletal  Strength & Muscle Tone: within normal limits Gait & Station: normal Patient leans: N/A  Psychiatric Specialty Exam  Presentation General Appearance:  Appropriate for Environment; Casual  Eye Contact: Good  Speech: Clear and Coherent; Normal Rate  Speech Volume: Normal  Handedness: Right   Mood and Affect  Mood: Depressed  Affect: Depressed; Tearful   Thought Process  Thought Processes: Coherent; Goal Directed; Linear  Descriptions of Associations:Intact  Orientation:Full (Time, Place and Person)  Thought Content:Logical; WDL    Hallucinations:Hallucinations: None  Ideas of Reference:None  Suicidal Thoughts:Suicidal Thoughts: No  Homicidal Thoughts:Homicidal Thoughts: No   Sensorium  Memory: Immediate Good; Recent Good  Judgment: Fair  Insight: Fair   Executive Functions  Concentration: Good  Attention Span: Good  Recall: Good  Fund of Knowledge: Good  Language: Good   Psychomotor Activity   Psychomotor Activity: Psychomotor Activity: Normal   Assets  Assets: Communication Skills; Desire for Improvement; Financial Resources/Insurance; Housing; Intimacy; Leisure Time; Physical Health; Resilience; Social Support   Sleep  Sleep: Sleep: Fair   Nutritional Assessment (For OBS and FBC admissions only) Has the patient had a weight loss or gain of 10 pounds or more in the last 3 months?: No Has the patient had a decrease in food intake/or appetite?: No Does the patient have dental problems?: No Does the patient have eating habits or behaviors that may be indicators of an eating disorder including binging or inducing vomiting?: No Has the patient recently lost weight without trying?: 0 Has the patient been eating poorly because of a decreased appetite?: 0 Malnutrition Screening Tool Score: 0    Physical Exam Vitals and nursing note reviewed.  Constitutional:      Appearance: Normal appearance. She is well-developed and normal weight.  HENT:     Head: Normocephalic and atraumatic.     Nose: Nose normal.  Cardiovascular:     Rate and Rhythm: Normal rate.  Pulmonary:     Effort: Pulmonary effort is normal.  Musculoskeletal:        General: Normal range of motion.     Cervical back: Normal range of motion.  Skin:    General: Skin is warm and dry.  Neurological:     Mental Status: She is alert and oriented to person, place, and time.  Psychiatric:        Attention and Perception: Attention and perception normal.        Mood and Affect: Mood is depressed. Affect is tearful.        Speech: Speech normal.        Behavior: Behavior normal. Behavior is cooperative.        Thought Content: Thought content normal.        Cognition and Memory: Cognition and memory normal.        Judgment: Judgment normal.    Review of Systems  Constitutional: Negative.   HENT: Negative.    Eyes: Negative.   Respiratory: Negative.    Cardiovascular: Negative.   Gastrointestinal:  Negative.   Genitourinary: Negative.   Musculoskeletal: Negative.   Skin: Negative.   Neurological: Negative.   Psychiatric/Behavioral:  Positive for substance abuse.     Blood pressure (!) 116/90, pulse 81, temperature 98.1 F (36.7 C), temperature source Oral, resp. rate 16, height 5' (1.524 m), weight 110 lb (49.9 kg), SpO2 100 %. Body mass index is 21.48 kg/m.  Past Psychiatric History: MDD, GAD  Is the patient at risk to self? No  Has the patient been a risk to self in the past 6 months? No .    Has the patient been a risk to self within the distant past? No   Is the patient a risk to others? No   Has the patient been a risk to others in the past 6 months? No   Has the patient been a risk to others within the distant past? No   Past Medical History:  Past Medical History:  Diagnosis Date   Anxiety    Asthma    childhood   Depression    Eczema    HPV in female     Past Surgical History:  Procedure Laterality Date   NO PAST SURGERIES      Family History:  Family History  Problem Relation Age of Onset   Diabetes Mother    Hypertension Mother    Heart disease Mother    Thyroid disease Sister    Diabetes Sister    Asthma Father    Diabetes Maternal Aunt    Cancer Paternal Uncle     Social History:  Social History   Socioeconomic History   Marital status: Single    Spouse name: Not on file   Number of children: Not on file   Years of education: Not on file   Highest education level: Not on file  Occupational History   Not on file  Tobacco Use   Smoking status: Former    Packs/day: 0.50    Types: Cigarettes   Smokeless tobacco: Never  Substance and Sexual Activity   Alcohol use: Yes   Drug use: Yes    Types: Cocaine    Comment: occassionally   Sexual activity: Yes    Birth control/protection: None  Other Topics Concern   Not on file  Social History Narrative   Not on file   Social Determinants of Health   Financial Resource Strain: Not on  file  Food Insecurity: Not on file  Transportation Needs: Not on file  Physical Activity: Not on file  Stress: Not on file  Social Connections: Not on file  Intimate Partner Violence: Not on file    SDOH:  SDOH Screenings   Depression (PHQ2-9): Low Risk  (11/22/2021)  Tobacco Use: Medium Risk (05/18/2021)    Last Labs:  No visits with results within 6 Month(s) from this visit.  Latest known visit with results is:  Admission on 05/18/2021, Discharged on 05/19/2021  Component Date Value Ref Range Status   Sodium 05/18/2021 137  135 - 145 mmol/L Final   Potassium 05/18/2021 2.6 (LL)  3.5 - 5.1 mmol/L Final   Comment: CRITICAL RESULT CALLED TO, READ BACK BY AND VERIFIED WITH: K.MUNNET,RN @1937  05/18/2021 [email or web address]    Chloride 05/18/2021 105  98 - 111 mmol/L Final   CO2 05/18/2021 22  22 - 32 mmol/L Final   Glucose, Bld 05/18/2021 82  70 - 99 mg/dL Final   Glucose reference range applies only to samples taken after fasting for at least 8 hours.   BUN 05/18/2021 <5 (L)  6 - 20 mg/dL Final   Creatinine, Ser 05/18/2021 0.66  0.44 - 1.00 mg/dL Final   Calcium 16/10/960404/07/2021 8.7 (L)  8.9 - 10.3 mg/dL Final   Total Protein 54/09/811904/07/2021 8.0  6.5 - 8.1 g/dL Final   Albumin 14/78/295604/07/2021 3.8  3.5 - 5.0 g/dL Final   AST 21/30/865704/07/2021 29  15 - 41 U/L Final   ALT 05/18/2021 13  0 - 44 U/L Final   Alkaline Phosphatase 05/18/2021 49  38 - 126 U/L Final   Total Bilirubin 05/18/2021 0.2 (L)  0.3 - 1.2 mg/dL Final   GFR, Estimated 05/18/2021 >60  >60 mL/min Final   Comment: (NOTE) Calculated using the CKD-EPI Creatinine Equation (2021)    Anion gap 05/18/2021 10  5 - 15 Final   Performed at [facility], [address] [place], [place] [address]   Alcohol, Ethyl (B) 05/18/2021 159 (H)  <10 mg/dL Final   Comment: (NOTE) Lowest detectable limit for serum alcohol is 10 mg/dL.  For medical purposes only. Performed at [facility], [address] [place], [place] [address]    WBC 05/18/2021  5.4  4.0 - 10.5 K/uL Final   RBC  05/18/2021 3.59 (L)  3.87 - 5.11 MIL/uL Final   Hemoglobin 05/18/2021 9.9 (L)  12.0 - 15.0 g/dL Final   HCT 45/62/5638 31.4 (L)  36.0 - 46.0 % Final   MCV 05/18/2021 87.5  80.0 - 100.0 fL Final   MCH 05/18/2021 27.6  26.0 - 34.0 pg Final   MCHC 05/18/2021 31.5  30.0 - 36.0 g/dL Final   RDW 93/73/4287 19.0 (H)  11.5 - 15.5 % Final   Platelets 05/18/2021 368  150 - 400 K/uL Final   nRBC 05/18/2021 0.0  0.0 - 0.2 % Final   Performed at [facility], [address] [place], [place] [address]   Opiates 05/18/2021 NONE DETECTED  NONE DETECTED Final   Cocaine 05/18/2021 POSITIVE (A)  NONE DETECTED Final   Benzodiazepines 05/18/2021 NONE DETECTED  NONE DETECTED Final   Amphetamines 05/18/2021 NONE DETECTED  NONE DETECTED Final   Tetrahydrocannabinol 05/18/2021 NONE DETECTED  NONE DETECTED Final   Barbiturates 05/18/2021 NONE DETECTED  NONE DETECTED Final   Comment: (NOTE) DRUG SCREEN FOR MEDICAL PURPOSES ONLY.  IF CONFIRMATION IS NEEDED FOR ANY PURPOSE, NOTIFY LAB WITHIN 5 DAYS.  LOWEST DETECTABLE LIMITS FOR URINE DRUG SCREEN Drug Class                     Cutoff (ng/mL) Amphetamine and metabolites    1000 Barbiturate and metabolites    200 Benzodiazepine                 200 Tricyclics and metabolites     300 Opiates and metabolites        300 Cocaine and metabolites        300 THC                            50 Performed at [facility], [address], [place], [place] [address]    I-stat hCG, quantitative 05/18/2021 <5.0  <5 mIU/mL Final   Comment 3 05/18/2021          Final   Comment:   GEST. AGE      CONC.  (mIU/mL)   <=1 WEEK        5 - 50     2 WEEKS       50 - 500     3 WEEKS       100 - 10,000     4 WEEKS     1,000 - 30,000        FEMALE AND NON-PREGNANT FEMALE:     LESS THAN 5 mIU/mL     Allergies: Clindamycin/lincomycin and Penicillins  PTA Medications: (Not in a hospital admission)   Long Term Goals: Improvement in  symptoms so as ready for discharge  Short Term Goals: Patient will verbalize feelings in meetings with treatment team members., Patient will attend at least of 50% of the groups daily., Pt will complete the PHQ9 on admission, day 3 and discharge., Patient will participate in completing the [place] Suicide Severity Rating Scale, Patient will score a low risk of violence for 24 hours prior to discharge, and Patient will take medications as prescribed daily.  Medical Decision Making  Patient reviewed with Dr. Nelly Rout.  She remains voluntary.  She will be admitted to facility based crisis unit while awaiting residential substance use treatment.  Current medications: -Acetaminophen 650 mg every 6 as needed/mild pain -Maalox 30 mL oral every 4 as  needed/digestion -Hydroxyzine 25 mg 3 times daily as needed/anxiety -Magnesium hydroxide 30 mL daily as needed/mild constipation -Trazodone 50 mg nightly as needed/sleep -Nicotine transdermal patch 14 mg daily/nicotine withdrawal  CIWA Ativan protocol initiated: -Loperamide 2 to 4 mg oral as needed/diarrhea or loose stools -Lorazepam 1 mg 4 times daily x4 doses, 1 mg 3 times daily x3 doses, 1 mg 2 times daily x2 doses, 1 mg daily x1 dose -Lorazepam 1 mg every 6 hours as needed CIWA greater than 10 -Multivitamin with minerals 1 tablet daily -Ondansetron disintegrating tablet 4 mg every 6 as needed/nausea or vomiting -Thiamine injection 100 mg IM once -Thiamine tablet 100 mg daily     Recommendations  Based on my evaluation the patient does not appear to have an emergency medical condition.  Lenard Lance, FNP 11/22/21  10:38 AM

## 2021-11-22 NOTE — ED Notes (Signed)
Dr. Alvie Heidelberg aware that staff unable to get patient labs. Dr. Alvie Heidelberg stated "Patient can still come over if labs were not drawn."

## 2021-11-22 NOTE — ED Notes (Signed)
Patient A&Ox4. Patient prsent to [facility] with Alcohol abuse disorder. Patient denies SI/HI and AVH. Patient oriented to unit. Patient denies any physical complaints when asked. No acute distress noted. Support and encouragement provided. Routine safety checks conducted according to facility protocol. Encouraged patient to notify staff if thoughts of harm toward self or others arise. Patient verbalize understanding and agreement. Will continue to monitor for safety. Q 15 min safety checks in place.

## 2021-11-23 ENCOUNTER — Encounter (HOSPITAL_COMMUNITY): Payer: Self-pay

## 2021-11-23 DIAGNOSIS — F32A Depression, unspecified: Secondary | ICD-10-CM | POA: Diagnosis not present

## 2021-11-23 DIAGNOSIS — F1010 Alcohol abuse, uncomplicated: Secondary | ICD-10-CM | POA: Diagnosis not present

## 2021-11-23 DIAGNOSIS — F1090 Alcohol use, unspecified, uncomplicated: Secondary | ICD-10-CM | POA: Diagnosis not present

## 2021-11-23 DIAGNOSIS — F411 Generalized anxiety disorder: Secondary | ICD-10-CM | POA: Diagnosis not present

## 2021-11-23 DIAGNOSIS — Z1152 Encounter for screening for COVID-19: Secondary | ICD-10-CM | POA: Diagnosis not present

## 2021-11-23 NOTE — ED Notes (Signed)
Pt in her room lying on the bed. No distress noted. Will continue to monitor.

## 2021-11-23 NOTE — ED Notes (Signed)
In AA group meeting 

## 2021-11-23 NOTE — BH IP Treatment Plan (Signed)
Interdisciplinary Treatment and Diagnostic Plan Update  11/23/2021 Time of Session: [place] MRN: [id number]  Diagnosis:  Final diagnoses:  Alcohol use disorder  Depressive disorder  Generalized anxiety disorder     Current Medications:  Current Facility-Administered Medications  Medication Dose Route Frequency Provider Last Rate Last Admin   acetaminophen (TYLENOL) tablet 650 mg  650 mg Oral Q6H PRN Lucky Rathke, FNP       alum & mag hydroxide-simeth (MAALOX/MYLANTA) 200-200-20 MG/5ML suspension 30 mL  30 mL Oral Q4H PRN Lucky Rathke, FNP       hydrOXYzine (ATARAX) tablet 25 mg  25 mg Oral TID PRN Lucky Rathke, FNP       loperamide (IMODIUM) capsule 2-4 mg  2-4 mg Oral PRN Lucky Rathke, FNP       LORazepam (ATIVAN) tablet 1 mg  1 mg Oral Q6H PRN Lucky Rathke, FNP       LORazepam (ATIVAN) tablet 1 mg  1 mg Oral TID Lucky Rathke, FNP   1 mg at 11/23/21 1618   Followed by   Derrill Memo ON 11/24/2021] LORazepam (ATIVAN) tablet 1 mg  1 mg Oral BID Lucky Rathke, FNP       Followed by   Derrill Memo ON 11/25/2021] LORazepam (ATIVAN) tablet 1 mg  1 mg Oral Daily Lucky Rathke, FNP       magnesium hydroxide (MILK OF MAGNESIA) suspension 30 mL  30 mL Oral Daily PRN Lucky Rathke, FNP       multivitamin with minerals tablet 1 tablet  1 tablet Oral Daily Lucky Rathke, FNP   1 tablet at 11/23/21 1011   nicotine (NICODERM CQ - dosed in mg/24 hours) patch 14 mg  14 mg Transdermal Daily Lucky Rathke, FNP   14 mg at 11/23/21 1010   ondansetron (ZOFRAN-ODT) disintegrating tablet 4 mg  4 mg Oral Q6H PRN Lucky Rathke, FNP       thiamine (VITAMIN B1) tablet 100 mg  100 mg Oral Daily Lucky Rathke, FNP   100 mg at 11/23/21 1009   traZODone (DESYREL) tablet 50 mg  50 mg Oral QHS PRN Lucky Rathke, FNP       No current outpatient medications on file.   PTA Medications: Prior to Admission medications   Not on File    Patient Stressors: Financial difficulties   Legal issue   Loss of  housing and transportation.   Occupational concerns   Substance abuse   Other: Living with her children's paternal aunt at this time; stressful living arrangement    Patient Strengths: Ability for insight  Average or above average intelligence  Capable of independent living  Communication skills  General fund of knowledge  Motivation for treatment/growth  Religious Affiliation  Special hobby/interest  Work skills   Treatment Modalities: Medication Management, Group therapy, Case management,  1 to 1 session with clinician, Psychoeducation, Recreational therapy.   Physician Treatment Plan for Primary and Secondary Diagnosis:  Final diagnoses:  Alcohol use disorder  Depressive disorder  Generalized anxiety disorder   Long Term Goal(s): Improvement in symptoms so as ready for discharge  Short Term Goals: Patient will verbalize feelings in meetings with treatment team members. Patient will attend at least of 50% of the groups daily. Pt will complete the PHQ9 on admission, day 3 and discharge. Patient will participate in completing the [place] Patient will score a low risk of violence for 24 hours  prior to discharge Patient will take medications as prescribed daily.  Medication Management: Evaluate patient's response, side effects, and tolerance of medication regimen.  Therapeutic Interventions: 1 to 1 sessions, Unit Group sessions and Medication administration.  Evaluation of Outcomes: Progressing  LCSW Treatment Plan for Primary Diagnosis:  Final diagnoses:  Alcohol use disorder  Depressive disorder  Generalized anxiety disorder    Long Term Goal(s): Safe transition to appropriate next level of care at discharge.  Short Term Goals: Facilitate acceptance of mental health diagnosis and concerns through verbal commitment to aftercare plan and appointments at discharge., Patient will identify one social support prior to discharge to aid in  patient's recovery., Patient will attend AA/NA groups as scheduled., Identify minimum of 2 triggers associated with mental health/substance abuse issues with treatment team members., and Increase skills for wellness and recovery by attending 50% of scheduled groups.  Therapeutic Interventions: Assess for all discharge needs, 1 to 1 time with Education officer, museum, Explore available resources and support systems, Assess for adequacy in community support network, Educate family and significant other(s) on suicide prevention, Complete Psychosocial Assessment, Interpersonal group therapy.  Evaluation of Outcomes: Progressing   Progress in Treatment: Attending groups: No.Just arrived on the [facility] unit. Participating in groups: No. Just arrived on the [facility] unit. Taking medication as prescribed: Yes. Toleration medication: Yes. Family/Significant other contact made: Yes, individual(s) contacted:  Paternal Aunt is aware of her being here as she is taking care of her children at the moment. Patient understands diagnosis: Yes. Discussing patient identified problems/goals with staff: Yes. Medical problems stabilized or resolved: Yes. Denies suicidal/homicidal ideation: Yes. Issues/concerns per patient self-inventory: Yes. Other: Very concerned about resources that can help her with housing and other basic needs.  New problem(s) identified: No, Describe:  Nothing that has not already been discussed in the CCA.  New Short Term/Long Term Goal(s):  "To get my life back on track."  Patient Goals:  Go to residential treatment for substance use.  Discharge Plan or Barriers:  Refer to [place].  Link her to Ms. Johnson at [facility] for assistance with getting her Medicaid transferred [facility] to [facility].   Reason for Continuation of Hospitalization: Withdrawal symptoms  Estimated Length of Stay:  Last 3 [place] Suicide Severity Risk  Score: [place] ED from 11/22/2021 in [facility] ED from 05/18/2021 in [place] No Risk No Risk       Last PHQ 2/9 Scores:    11/22/2021   10:35 AM  Depression screen PHQ 2/9  Decreased Interest 0  Down, Depressed, Hopeless 1  PHQ - 2 Score 1    Scribe for Treatment Team: Kerri Perches, LCSW 11/23/2021 4:35 PM

## 2021-11-23 NOTE — Tx Team (Signed)
Treatment Team Meeting   Dr. Alvie Heidelberg and LCSW met with pt separately to assess current mood and affect, physical state, and inquire about needs and goals while here in [facility] and after discharge.  LCSW completed CCA with pt.  Pt expressed having a minimal support system with family other than the paternal aunt who is currently taking care of her children while she is getting treatment for herself.  Pt expressed wanting residential treatment with [facility].  A referral will be placed with Donata Duff, [phone].  Pt reported that she is employed in [employer] but does not have her own transportation and must pay for taxis or share rides to take her to work.  Pt reports onset of alcohol use at age 41 that only consisted of 2-3 drinks per day, but recently increased to 1/2 pint of liquor 2-3x/week in the last 4-5 months.  Pt stated that she had an apartment of her own in [place], but gave up the apartment due on the advice of others who thought it would be better to live with family associated with her children.  Pt expressed regret in giving up her apartment. Pt requested help with getting into a residential treatment facility and finding housing for her and her four children.  Pt reported feeling highly frustrated with not finding community support to help her.   Omelia Blackwater, MSW, [place] [facility] [phone] phone [phone] work phone

## 2021-11-23 NOTE — ED Notes (Signed)
Cabela up ambulatory throughout the hallways. Has made several phone calls throughout the afternoon. In dayroom watching TV. Ate dinner. No concerns voiced at this time. Staff will continue to monitor for safety q15 min.

## 2021-11-23 NOTE — ED Notes (Signed)
Lindsey Velazquez calm and cooperative with depressed mood during morning assessment. Denies suicidal and homicidal thoughts. Denies Depression and anxiety at this time. Denies auditory and visual hallucinations. Attended and participated in safety plan group. Ate breakfast and lunch. In dining area throughout the morning watching TV. Med compliant. No concerns expressed. Staff will continue to monitor for safety q15 min.

## 2021-11-23 NOTE — ED Provider Notes (Signed)
Behavioral Health Progress Note  Date and Time: 11/23/2021 6:59 PM Name: Lindsey Velazquez MRN:  [id number]  Subjective:   Lindsey Velazquez is a 31 year old female with a past psychiatric history of no major conditions.  [facility] at the recommendation of her attorney because she has 2 previous DWI charges and an upcoming court date.  She reports that she uses alcohol on a regular basis and that this is an issue for her.  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  The patient desires residential rehab.  On interview and assessment today, the patient reports that she does not ever experience bad withdrawal but will occasionally get tremulous.  She reports experiencing some depression prior to admission but states that she has been sleeping well, eating well, and exhibits no anhedonia.  She denies experiencing suicidal or homicidal thoughts.  She denies experiencing auditory or visual hallucinations.    Diagnosis:  Final diagnoses:  Alcohol use disorder  Depressive disorder  Generalized anxiety disorder    Total Time spent with patient: 30 minutes  Past Psychiatric History: as above Past Medical History:  Past Medical History:  Diagnosis Date   Anxiety    Asthma    childhood   Depression    Eczema    HPV in female     Past Surgical History:  Procedure Laterality Date   NO PAST SURGERIES     Family History:  Family History  Problem Relation Age of Onset   Diabetes Mother    Hypertension Mother    Heart disease Mother    Thyroid disease Sister    Diabetes Sister    Asthma Father    Diabetes Maternal Aunt    Cancer Paternal Uncle    Family Psychiatric  History: per H and P Social History:  Social History   Substance and Sexual Activity  Alcohol Use Yes     Social History   Substance and Sexual Activity  Drug Use Yes   Types: Cocaine   Comment: occassionally    Social History   Socioeconomic History   Marital  status: Single    Spouse name: Not on file   Number of children: Not on file   Years of education: Not on file   Highest education level: Not on file  Occupational History   Not on file  Tobacco Use   Smoking status: Former    Packs/day: 0.50    Types: Cigarettes   Smokeless tobacco: Never  Substance and Sexual Activity   Alcohol use: Yes   Drug use: Yes    Types: Cocaine    Comment: occassionally   Sexual activity: Yes    Birth control/protection: None  Other Topics Concern   Not on file  Social History Narrative   Not on file   Social Determinants of Health   Financial Resource Strain: Not on file  Food Insecurity: Not on file  Transportation Needs: Not on file  Physical Activity: Not on file  Stress: Not on file  Social Connections: Not on file   SDOH:  SDOH Screenings   Depression (PHQ2-9): Low Risk  (11/22/2021)  Tobacco Use: Medium Risk (05/18/2021)   Additional Social History:    Pain Medications: none reported Prescriptions: none reported Over the Counter: none reported History of alcohol / drug use?: Yes Longest period of sobriety (when/how long): unable to assess Negative Consequences of Use:  (none reported by patient) Withdrawal Symptoms: Agitation, Irritability Name of Substance 1: Alcohol 1 -  Age of First Use: 17 1 - Amount (size/oz): Patient reports she drinks a half pint of hard liquor about 2-3 times a week. 1 - Frequency: 2-3 times a week. 1 - Duration: Patient reports she has been drinking since the age of 77, however reports an increase in use over the past 4-5 months 1 - Last Use / Amount: prior to admission 1- Route of Use: Drinking                  Sleep: Fair  Appetite:  Fair  Current Medications:  Current Facility-Administered Medications  Medication Dose Route Frequency Provider Last Rate Last Admin   acetaminophen (TYLENOL) tablet 650 mg  650 mg Oral Q6H PRN Lucky Rathke, FNP       alum & mag hydroxide-simeth  (MAALOX/MYLANTA) 200-200-20 MG/5ML suspension 30 mL  30 mL Oral Q4H PRN Lucky Rathke, FNP       hydrOXYzine (ATARAX) tablet 25 mg  25 mg Oral TID PRN Lucky Rathke, FNP       loperamide (IMODIUM) capsule 2-4 mg  2-4 mg Oral PRN Lucky Rathke, FNP       LORazepam (ATIVAN) tablet 1 mg  1 mg Oral Q6H PRN Lucky Rathke, FNP       LORazepam (ATIVAN) tablet 1 mg  1 mg Oral TID Lucky Rathke, FNP   1 mg at 11/23/21 1618   Followed by   Derrill Memo ON 11/24/2021] LORazepam (ATIVAN) tablet 1 mg  1 mg Oral BID Lucky Rathke, FNP       Followed by   Derrill Memo ON 11/25/2021] LORazepam (ATIVAN) tablet 1 mg  1 mg Oral Daily Lucky Rathke, FNP       magnesium hydroxide (MILK OF MAGNESIA) suspension 30 mL  30 mL Oral Daily PRN Lucky Rathke, FNP       multivitamin with minerals tablet 1 tablet  1 tablet Oral Daily Lucky Rathke, FNP   1 tablet at 11/23/21 1011   nicotine (NICODERM CQ - dosed in mg/24 hours) patch 14 mg  14 mg Transdermal Daily Lucky Rathke, FNP   14 mg at 11/23/21 1010   ondansetron (ZOFRAN-ODT) disintegrating tablet 4 mg  4 mg Oral Q6H PRN Lucky Rathke, FNP       thiamine (VITAMIN B1) tablet 100 mg  100 mg Oral Daily Lucky Rathke, FNP   100 mg at 11/23/21 1009   traZODone (DESYREL) tablet 50 mg  50 mg Oral QHS PRN Lucky Rathke, FNP       No current outpatient medications on file.    Labs  Lab Results:  Admission on 11/22/2021  Component Date Value Ref Range Status   SARS Coronavirus 2 by RT PCR 11/22/2021 NEGATIVE  NEGATIVE Final   Comment: (NOTE) SARS-CoV-2 target nucleic acids are NOT DETECTED.  The SARS-CoV-2 RNA is generally detectable in upper respiratory specimens during the acute phase of infection. The lowest concentration of SARS-CoV-2 viral copies this assay can detect is 138 copies/mL. A negative result does not preclude SARS-Cov-2 infection and should not be used as the sole basis for treatment or other patient management decisions. A negative result may occur with   improper specimen collection/handling, submission of specimen other than nasopharyngeal swab, presence of viral mutation(s) within the areas targeted by this assay, and inadequate number of viral copies(<138 copies/mL). A negative result must be combined with clinical observations, patient history, and epidemiological information. The expected result is  Negative.  Fact Sheet for Patients:  BloggerCourse.com  Fact Sheet for Healthcare Providers:  SeriousBroker.it  This test is no                          t yet approved or cleared by the [place] FDA and  has been authorized for detection and/or diagnosis of SARS-CoV-2 by FDA under an Emergency Use Authorization (EUA). This EUA will remain  in effect (meaning this test can be used) for the duration of the COVID-19 declaration under Section 564(b)(1) of the Act, 21 U.S.C.section 360bbb-3(b)(1), unless the authorization is terminated  or revoked sooner.       Influenza A by PCR 11/22/2021 NEGATIVE  NEGATIVE Final   Influenza B by PCR 11/22/2021 NEGATIVE  NEGATIVE Final   Comment: (NOTE) The Xpert Xpress SARS-CoV-2/FLU/RSV plus assay is intended as an aid in the diagnosis of influenza from Nasopharyngeal swab specimens and should not be used as a sole basis for treatment. Nasal washings and aspirates are unacceptable for Xpert Xpress SARS-CoV-2/FLU/RSV testing.  Fact Sheet for Patients: BloggerCourse.com  Fact Sheet for Healthcare Providers: SeriousBroker.it  This test is not yet approved or cleared by the [place] FDA and has been authorized for detection and/or diagnosis of SARS-CoV-2 by FDA under an Emergency Use Authorization (EUA). This EUA will remain in effect (meaning this test can be used) for the duration of the COVID-19 declaration under Section 564(b)(1) of the Act, 21 U.S.C. section 360bbb-3(b)(1), unless  the authorization is terminated or revoked.  Performed at [facility], [address] [place], [place] [address]    POC Amphetamine UR 11/22/2021 None Detected  NONE DETECTED (Cut Off Level 1000 ng/mL) Final   POC Secobarbital (BAR) 11/22/2021 None Detected  NONE DETECTED (Cut Off Level 300 ng/mL) Final   POC Buprenorphine (BUP) 11/22/2021 None Detected  NONE DETECTED (Cut Off Level 10 ng/mL) Final   POC Oxazepam (BZO) 11/22/2021 None Detected  NONE DETECTED (Cut Off Level 300 ng/mL) Final   POC Cocaine UR 11/22/2021 None Detected  NONE DETECTED (Cut Off Level 300 ng/mL) Final   POC Methamphetamine UR 11/22/2021 Positive (A)  NONE DETECTED (Cut Off Level 1000 ng/mL) Final   POC Morphine 11/22/2021 None Detected  NONE DETECTED (Cut Off Level 300 ng/mL) Final   POC Methadone UR 11/22/2021 None Detected  NONE DETECTED (Cut Off Level 300 ng/mL) Final   POC Oxycodone UR 11/22/2021 None Detected  NONE DETECTED (Cut Off Level 100 ng/mL) Final   POC Marijuana UR 11/22/2021 None Detected  NONE DETECTED (Cut Off Level 50 ng/mL) Final   SARSCOV2ONAVIRUS 2 AG 11/22/2021 NEGATIVE  NEGATIVE Final   Comment: (NOTE) SARS-CoV-2 antigen NOT DETECTED.   Negative results are presumptive.  Negative results do not preclude SARS-CoV-2 infection and should not be used as the sole basis for treatment or other patient management decisions, including infection  control decisions, particularly in the presence of clinical signs and  symptoms consistent with COVID-19, or in those who have been in contact with the virus.  Negative results must be combined with clinical observations, patient history, and epidemiological information. The expected result is Negative.  Fact Sheet for Patients: https://www.jennings-kim.com/  Fact Sheet for Healthcare Providers: https://alexander-rogers.biz/  This test is not yet approved or cleared by the [place] FDA and  has been authorized for  detection and/or diagnosis of SARS-CoV-2 by FDA under an Emergency Use Authorization (EUA).  This EUA will remain in  effect (meaning this test can be used) for the duration of  the COV                          ID-19 declaration under Section 564(b)(1) of the Act, 21 U.S.C. section 360bbb-3(b)(1), unless the authorization is terminated or revoked sooner.     Preg Test, Ur 11/22/2021 NEGATIVE  NEGATIVE Final   Comment:        THE SENSITIVITY OF THIS METHODOLOGY IS >24 mIU/mL     Blood Alcohol level:  Lab Results  Component Value Date   ETH 159 (H) 05/18/2021    Metabolic Disorder Labs: No results found for: "HGBA1C", "MPG" No results found for: "PROLACTIN" No results found for: "CHOL", "TRIG", "HDL", "CHOLHDL", "VLDL", "LDLCALC"  Therapeutic Lab Levels: No results found for: "LITHIUM" No results found for: "VALPROATE" No results found for: "CBMZ"  Physical Findings   PHQ2-9    Flowsheet Row ED from 11/22/2021 in [facility]  PHQ-2 Total Score 1      Flowsheet Row ED from 11/22/2021 in [facility] ED from 05/18/2021 in [facility] EMERGENCY DEPARTMENT  C-SSRS RISK CATEGORY No Risk No Risk        Musculoskeletal  Strength & Muscle Tone: within normal limits Gait & Station: normal Patient leans: N/A  Psychiatric Specialty Exam  Presentation  General Appearance:  Appropriate for Environment; Casual  Eye Contact: Good  Speech: Clear and Coherent; Normal Rate  Speech Volume: Normal  Handedness: Right   Mood and Affect  Mood: Euthymic  Affect: congruent  Thought Process  Thought Processes: Coherent; Goal Directed; Linear  Descriptions of Associations:Intact  Orientation:Full (Time, Place and Person)  Thought Content:Logical; WDL  Diagnosis of Schizophrenia or Schizoaffective disorder in past: No    Hallucinations:Hallucinations: None  Ideas of  Reference:None  Suicidal Thoughts:Suicidal Thoughts: No  Homicidal Thoughts:Homicidal Thoughts: No   Sensorium  Memory: Immediate Good; Recent Good  Judgment: Fair  Insight: Fair   Art therapist  Concentration: Good  Attention Span: Good  Recall: Good  Fund of Knowledge: Good  Language: Good   Psychomotor Activity  Psychomotor Activity: Psychomotor Activity: Normal   Assets  Assets: Communication Skills; Desire for Improvement; Financial Resources/Insurance; Housing; Intimacy; Leisure Time; Physical Health; Resilience; Social Support   Sleep  Sleep: Sleep: Fair   Nutritional Assessment (For OBS and FBC admissions only) Has the patient had a weight loss or gain of 10 pounds or more in the last 3 months?: No Has the patient had a decrease in food intake/or appetite?: No Does the patient have dental problems?: No Does the patient have eating habits or behaviors that may be indicators of an eating disorder including binging or inducing vomiting?: No Has the patient recently lost weight without trying?: 0 Has the patient been eating poorly because of a decreased appetite?: 0 Malnutrition Screening Tool Score: 0    Physical Exam  Physical Exam Constitutional:      Appearance: the patient is not toxic-appearing.  Pulmonary:     Effort: Pulmonary effort is normal.  Neurological:     General: No focal deficit present.     Mental Status: the patient is alert and oriented to person, place, and time.   Review of Systems  Respiratory:  Negative for shortness of breath.   Cardiovascular:  Negative for chest pain.  Gastrointestinal:  Negative for abdominal pain, constipation, diarrhea, nausea and vomiting.  Neurological:  Negative for headaches.   Blood pressure 109/79, pulse 86, temperature 98.5 F (36.9 C), temperature source Oral, resp. rate 18, height 5' (1.524 m), weight 110 lb (49.9 kg), SpO2 99 %. Body mass index is 21.48 kg/m.  Treatment  Plan Summary: Daily contact with patient to assess and evaluate symptoms and progress in treatment and Medication management   Status: Voluntary  Unable to obtain lab work even after multiple attempts by 3 different RNs.  Patient does have some history of anemia but presently denies any symptoms.  Urine pregnancy test was negative.  Dispo: We will attempt to obtain residential rehab for the patient at [facility]  Carlyn Reichert, MD 11/23/2021 6:59 PM

## 2021-11-23 NOTE — ED Notes (Signed)
Pt is in the bed sleeping. Respirations are even and unlabored. No acute distress noted. Will continue to monitor for safety. 

## 2021-11-23 NOTE — BH Assessment (Signed)
Comprehensive Clinical Assessment (CCA) Note  11/23/2021 Kelley Polinsky [id number]  Chief Complaint:  Chief Complaint  Patient presents with   Alcohol Problem   Visit Diagnosis: Alcohol Use Disorder    CCA Screening, Triage and Referral (STR)  Patient Reported Information How did you hear about [place]? Other (Comment) ([facility])  What Is the Reason for Your Visit/Call Today? Pt requesting alcohol detox. Denies SI, HI, AVH  How Long Has This Been Causing You Problems? 1-6 months  What Do You Feel Would Help You the Most Today? Alcohol or Drug Use Treatment   Have You Recently Had Any Thoughts About Hurting Yourself? No  Are You Planning to Commit Suicide/Harm Yourself At This time? No   Have you Recently Had Thoughts About [place]? No  Are You Planning to Harm Someone at This Time? No  Explanation: No data recorded  Have You Used Any Alcohol or Drugs in the Past 24 Hours? Yes  How Long Ago Did You Use Drugs or Alcohol? No data recorded What Did You Use and How Much? drunk a half of can of [place] Currently Have a Therapist/Psychiatrist? No data recorded Name of Therapist/Psychiatrist: No data recorded  Have You Been Recently Discharged From Any Office Practice or Programs? No data recorded Explanation of Discharge From Practice/Program: No data recorded    CCA Screening Triage Referral Assessment Type of Contact: No data recorded Telemedicine Service Delivery:   Is this Initial or Reassessment? No data recorded Date Telepsych consult ordered in [facility]:  No data recorded Time Telepsych consult ordered in [facility]:  No data recorded Location of Assessment: No data recorded Provider Location: No data recorded  Collateral Involvement: No data recorded  Does Patient Have a [place]? No data recorded Legal Guardian Contact Information: No data recorded Copy of Legal Guardianship Form: No data recorded Legal Guardian Notified  of Arrival: No data recorded Legal Guardian Notified of Pending Discharge: No data recorded If Minor and Not Living with Parent(s), Who has Custody? No data recorded Is CPS involved or ever been involved? No data recorded Is APS involved or ever been involved? No data recorded  Patient Determined To Be At Risk for Harm To Self or Others Based on Review of Patient Reported Information or Presenting Complaint? No data recorded Method: No data recorded Availability of Means: No data recorded Intent: No data recorded Notification Required: No data recorded Additional Information for Danger to Others Potential: No data recorded Additional Comments for Danger to Others Potential: No data recorded Are There Guns or Other Weapons in Your Home? No data recorded Types of Guns/Weapons: No data recorded Are These Weapons Safely Secured?                            No data recorded Who Could Verify You Are Able To Have These Secured: No data recorded Do You Have any Outstanding Charges, Pending Court Dates, Parole/Probation? No data recorded Contacted To Inform of Risk of Harm To Self or Others: No data recorded   Does Patient Present under Involuntary Commitment? No data recorded IVC Papers Initial File Date: No data recorded  [place] of Residence: No data recorded  Patient Currently Receiving the Following Services: No data recorded  Determination of Need: Routine (7 days)   Options For Referral: Facility-Based Crisis; Outpatient Therapy; [facility]     CCA Biopsychosocial Patient Reported Schizophrenia/Schizoaffective Diagnosis in Past: No  Strengths: Patient appears to be resourceful, motivated to seek treatment, cooperative, and receptive to feedback.   Mental Health Symptoms Depression:   Hopelessness; Increase/decrease in appetite; Irritability; Worthlessness (Hopelessness)   Duration of Depressive symptoms:  Duration of Depressive Symptoms: Greater than two weeks    Mania:   Racing thoughts (inability to concentration on task at hand due to environmental factors)   Anxiety:    Difficulty concentrating; Irritability; Worrying   Psychosis:   None   Duration of Psychotic symptoms:    Trauma:   None   Obsessions:   None   Compulsions:   None   Inattention:   None   Hyperactivity/Impulsivity:   None   Oppositional/Defiant Behaviors:   None   Emotional Irregularity:   Mood lability   Other Mood/Personality Symptoms:   Patient appeared to be somewhat guarded in her responses to certain questions; Labile affect    Mental Status Exam Appearance and self-care  Stature:   Average   Weight:   Average weight   Clothing:   Age-appropriate (Observed wearing unit scrubs)   Grooming:   Normal   Cosmetic use:   Age appropriate   Posture/gait:   Normal   Motor activity:   Not Remarkable   Sensorium  Attention:   Normal   Concentration:   Normal   Orientation:   X5   Recall/memory:   Normal   Affect and Mood  Affect:   Anxious; Appropriate; Congruent; Flat; Labile   Mood:   Anxious; Hopeless   Relating  Eye contact:   Normal   Facial expression:   Depressed (Flat)   Attitude toward examiner:   Cooperative; Guarded; Resistant   Thought and Language  Speech flow:  Clear and Coherent   Thought content:   Appropriate to Mood and Circumstances   Preoccupation:   Other (Comment) (Focused on outcome: Finding housing for herself and four children)   Hallucinations:   None   Organization:  No data recorded  [employer] of Knowledge:   Average   Intelligence:   Average   Abstraction:   Normal   Judgement:   Fair   Reality Testing:   Adequate   Insight:   Fair   Decision Making:   Normal   Social Functioning  Social Maturity:   Isolates; Responsible   Social Judgement:   Normal; Victimized   Stress  Stressors:   Family conflict; Grief/losses; Housing; Surveyor, quantity;  Relationship; Work   Coping Ability:   Deficient supports; Resilient   Skill Deficits:   None   Supports:   Support needed; Other (Comment) (Patient reports she is currently receiving support from her "childrens' side of the family".)     Religion: Religion/Spirituality Are You A Religious Person?: Yes What is Your Religious Affiliation?: Christian How Might This Affect Treatment?: Patient reports her faith affects her decision making  Leisure/Recreation: Leisure / Recreation Do You Have Hobbies?: Yes Leisure and Hobbies: Patient reports she enjoys reading books and researching various resources for her and her children  Exercise/Diet: Exercise/Diet Do You Exercise?: No Have You Gained or Lost A Significant Amount of Weight in the Past Six Months?: No Do You Follow a Special Diet?: No Do You Have Any Trouble Sleeping?: No   CCA Employment/Education Employment/Work Situation: Employment / Work Situation Employment Situation: Employed Work Stressors: None identified on the job; Patient reports she works part time with [employer]; however has issues with transportation Patient's Job has Been Impacted by Current Illness: No  Has Patient ever Been in the [employer]?: No  Education: Education Is Patient Currently Attending School?: No Last Grade Completed: 12 Did You Attend College?: No Did You Have An Individualized Education Program (IIEP): No Did You Have Any Difficulty At School?: No Patient's Education Has Been Impacted by Current Illness: No   CCA Family/Childhood History Family and Relationship History: Family history Marital status: Single Does patient have children?: Yes How many children?: 4 How is patient's relationship with their children?: Patient reports a good relationship her children; Patient reports she has a 28 year old son, 39 year old daughter , 23 year old son, and 75 year old son. Patient reports they are currently residing with paternal  aunt, and has been for the past 4-5 months.  Childhood History:  Childhood History By whom was/is the patient raised?: Both parents Did patient suffer any verbal/emotional/physical/sexual abuse as a child?: No Did patient suffer from severe childhood neglect?: No Has patient ever been sexually abused/assaulted/raped as an adolescent or adult?: No Was the patient ever a victim of a crime or a disaster?: No Witnessed domestic violence?: No Has patient been affected by domestic violence as an adult?: No  Child/Adolescent Assessment:     CCA Substance Use Alcohol/Drug Use: Alcohol / Drug Use Pain Medications: none reported Prescriptions: none reported Over the Counter: none reported History of alcohol / drug use?: Yes Longest period of sobriety (when/how long): unable to assess Negative Consequences of Use:  (none reported by patient) Withdrawal Symptoms: Agitation, Irritability Substance #1 Name of Substance 1: Alcohol 1 - Age of First Use: 17 1 - Amount (size/oz): Patient reports she drinks a half pint of hard liquor about 2-3 times a week. 1 - Frequency: 2-3 times a week. 1 - Duration: Patient reports she has been drinking since the age of 52, however reports an increase in use over the past 4-5 months 1 - Last Use / Amount: prior to admission 1- Route of Use: Drinking                       ASAM's:  Six Dimensions of Multidimensional Assessment  Dimension 1:  Acute Intoxication and/or Withdrawal Potential:   Dimension 1:  Description of individual's past and current experiences of substance use and withdrawal: Patient denies any withdrawal symptoms other than irritability related to alcohol use.  Dimension 2:  Biomedical Conditions and Complications:   Dimension 2:  Description of patient's biomedical conditions and  complications: none reported  Dimension 3:  Emotional, Behavioral, or Cognitive Conditions and Complications:  Dimension 3:  Description of emotional,  behavioral, or cognitive conditions and complications: Patient denies any withdrawal symptoms other than irritability related to alcohol use.  Dimension 4:  Readiness to Change:  Dimension 4:  Description of Readiness to Change criteria: Patient expressed interest in residential treatment for alcohol use  Dimension 5:  Relapse, Continued use, or Continued Problem Potential:  Dimension 5:  Relapse, continued use, or continued problem potential critiera description: Patient reports use is related to home stressors and lack of resources available  Dimension 6:  Recovery/Living Environment:     ASAM Severity Score: ASAM's Severity Rating Score: 7  ASAM Recommended Level of Treatment: ASAM Recommended Level of Treatment: Level III Residential Treatment   Substance use Disorder (SUD) Substance Use Disorder (SUD)  Checklist Symptoms of Substance Use: Continued use despite persistent or recurrent social, interpersonal problems, caused or exacerbated by use, Continued use despite having a persistent/recurrent physical/psychological problem caused/exacerbated  by use, Evidence of tolerance (Patient reports she has had two DWI's over the past 2-3 years.)  Recommendations for Services/Supports/Treatments: Recommendations for Services/Supports/Treatments Recommendations For Services/Supports/Treatments: Individual Therapy, CD-IOP Intensive Chemical Dependency Program, Residential-Level 3  Discharge Disposition: Discharge Disposition Medical Exam completed: Yes Disposition of Patient:  (Patient expressed interest in residental treatment) Mode of transportation if patient is discharged/movement?: Other (comment) (TBD)  DSM5 Diagnoses: Patient Active Problem List   Diagnosis Date Noted   Alcohol use disorder 11/22/2021   Acute blood loss anemia 10/30/2017   Varicella, non-immune 10/11/2017   Neutropenia (HCC) 12/09/2013   Depressive disorder 09/11/2012   Generalized anxiety disorder 09/11/2012   H/O  premature delivery 09/13/2011     Referrals to Alternative Service(s): Referred to Alternative Service(s):   Place:   Date:   Time:    Referred to Alternative Service(s):   Place:   Date:   Time:    Referred to Alternative Service(s):   Place:   Date:   Time:    Referred to Alternative Service(s):   Place:   Date:   Time:     Jake Samples, LCSW

## 2021-11-24 DIAGNOSIS — F1090 Alcohol use, unspecified, uncomplicated: Secondary | ICD-10-CM | POA: Diagnosis not present

## 2021-11-24 DIAGNOSIS — F1010 Alcohol abuse, uncomplicated: Secondary | ICD-10-CM | POA: Diagnosis not present

## 2021-11-24 DIAGNOSIS — F32A Depression, unspecified: Secondary | ICD-10-CM | POA: Diagnosis not present

## 2021-11-24 DIAGNOSIS — Z1152 Encounter for screening for COVID-19: Secondary | ICD-10-CM | POA: Diagnosis not present

## 2021-11-24 DIAGNOSIS — F411 Generalized anxiety disorder: Secondary | ICD-10-CM | POA: Diagnosis not present

## 2021-11-24 MED ORDER — SERTRALINE HCL 50 MG PO TABS
50.0000 mg | ORAL_TABLET | Freq: Every day | ORAL | Status: DC
  Administered 2021-11-24 – 2021-11-30 (×7): 50 mg via ORAL
  Filled 2021-11-24 (×7): qty 1
  Filled 2021-11-24: qty 14

## 2021-11-24 NOTE — ED Notes (Signed)
Patient attending [employer] meeting. 

## 2021-11-24 NOTE — ED Notes (Signed)
Pt is sleeping at present. No distress noted. Will continue to monitor.

## 2021-11-24 NOTE — ED Provider Notes (Signed)
Behavioral Health Progress Note  Date and Time: 11/24/2021 2:37 PM Name: Lindsey Velazquez MRN:  [id number]  Subjective:   Lindsey Velazquez is a 31 year old female with a past psychiatric history of no major conditions.  She came to the [facility] behavioral health urgent care at the recommendation of her attorney because she has 2 previous DWI charges and an upcoming court date.  She reports that she uses alcohol on a regular basis and that this is an issue for her.  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  The patient desires residential rehab.  On interview and assessment today, the patient reports that she is not experiencing any withdrawal symptoms.  She continues to appear interested in attending residential rehab at [facility].  Her Ativan taper is going well.  The patient requests restarting her Zoloft.  I told her I was amenable to this.  She requests housing resources from the Education officer, museum.  If she has questions about [facility] and nursing is to provide her more information regarding [facility] the printed materials.  She denies experiencing suicidal or homicidal thoughts.  She denies experiencing auditory or visual hallucinations.    Diagnosis:  Final diagnoses:  Alcohol use disorder  Depressive disorder  Generalized anxiety disorder    Total Time spent with patient: 30 minutes  Past Psychiatric History: as above Past Medical History:  Past Medical History:  Diagnosis Date   Anxiety    Asthma    childhood   Depression    Eczema    HPV in female     Past Surgical History:  Procedure Laterality Date   NO PAST SURGERIES     Family History:  Family History  Problem Relation Age of Onset   Diabetes Mother    Hypertension Mother    Heart disease Mother    Thyroid disease Sister    Diabetes Sister    Asthma Father    Diabetes Maternal Aunt    Cancer Paternal Uncle    Family Psychiatric  History: per H and P Social History:  Social History   Substance and Sexual  Activity  Alcohol Use Yes     Social History   Substance and Sexual Activity  Drug Use Yes   Types: Cocaine   Comment: occassionally    Social History   Socioeconomic History   Marital status: Single    Spouse name: Not on file   Number of children: Not on file   Years of education: Not on file   Highest education level: Not on file  Occupational History   Not on file  Tobacco Use   Smoking status: Former    Packs/day: 0.50    Types: Cigarettes   Smokeless tobacco: Never  Substance and Sexual Activity   Alcohol use: Yes   Drug use: Yes    Types: Cocaine    Comment: occassionally   Sexual activity: Yes    Birth control/protection: None  Other Topics Concern   Not on file  Social History Narrative   Not on file   Social Determinants of Health   Financial Resource Strain: Not on file  Food Insecurity: Not on file  Transportation Needs: Not on file  Physical Activity: Not on file  Stress: Not on file  Social Connections: Not on file   SDOH:  SDOH Screenings   Depression (PHQ2-9): Low Risk  (11/22/2021)  Tobacco Use: Medium Risk (05/18/2021)   Additional Social History:    Pain Medications: none reported Prescriptions: none reported Over the Counter: none reported  History of alcohol / drug use?: Yes Longest period of sobriety (when/how long): unable to assess Negative Consequences of Use:  (none reported by patient) Withdrawal Symptoms: Agitation, Irritability Name of Substance 1: Alcohol 1 - Age of First Use: 17 1 - Amount (size/oz): Patient reports she drinks a half pint of hard liquor about 2-3 times a week. 1 - Frequency: 2-3 times a week. 1 - Duration: Patient reports she has been drinking since the age of 44, however reports an increase in use over the past 4-5 months 1 - Last Use / Amount: prior to admission 1- Route of Use: Drinking                  Sleep: Fair  Appetite:  Fair  Current Medications:  Current Facility-Administered  Medications  Medication Dose Route Frequency Provider Last Rate Last Admin   acetaminophen (TYLENOL) tablet 650 mg  650 mg Oral Q6H PRN Lucky Rathke, FNP       alum & mag hydroxide-simeth (MAALOX/MYLANTA) 200-200-20 MG/5ML suspension 30 mL  30 mL Oral Q4H PRN Lucky Rathke, FNP       hydrOXYzine (ATARAX) tablet 25 mg  25 mg Oral TID PRN Lucky Rathke, FNP       loperamide (IMODIUM) capsule 2-4 mg  2-4 mg Oral PRN Lucky Rathke, FNP       LORazepam (ATIVAN) tablet 1 mg  1 mg Oral Q6H PRN Lucky Rathke, FNP       LORazepam (ATIVAN) tablet 1 mg  1 mg Oral BID Lucky Rathke, FNP   1 mg at 11/24/21 0915   Followed by   Derrill Memo ON 11/25/2021] LORazepam (ATIVAN) tablet 1 mg  1 mg Oral Daily Lucky Rathke, FNP       magnesium hydroxide (MILK OF MAGNESIA) suspension 30 mL  30 mL Oral Daily PRN Lucky Rathke, FNP       multivitamin with minerals tablet 1 tablet  1 tablet Oral Daily Lucky Rathke, FNP   1 tablet at 11/24/21 0916   nicotine (NICODERM CQ - dosed in mg/24 hours) patch 14 mg  14 mg Transdermal Daily Lucky Rathke, FNP   14 mg at 11/24/21 0917   ondansetron (ZOFRAN-ODT) disintegrating tablet 4 mg  4 mg Oral Q6H PRN Lucky Rathke, FNP       sertraline (ZOLOFT) tablet 50 mg  50 mg Oral Daily Corky Sox, MD   50 mg at 11/24/21 1416   thiamine (VITAMIN B1) tablet 100 mg  100 mg Oral Daily Lucky Rathke, FNP   100 mg at 11/24/21 0916   traZODone (DESYREL) tablet 50 mg  50 mg Oral QHS PRN Lucky Rathke, FNP   50 mg at 11/23/21 2140   No current outpatient medications on file.    Labs  Lab Results:  Admission on 11/22/2021  Component Date Value Ref Range Status   SARS Coronavirus 2 by RT PCR 11/22/2021 NEGATIVE  NEGATIVE Final   Comment: (NOTE) SARS-CoV-2 target nucleic acids are NOT DETECTED.  The SARS-CoV-2 RNA is generally detectable in upper respiratory specimens during the acute phase of infection. The lowest concentration of SARS-CoV-2 viral copies this assay can detect is 138  copies/mL. A negative result does not preclude SARS-Cov-2 infection and should not be used as the sole basis for treatment or other patient management decisions. A negative result may occur with  improper specimen collection/handling, submission of specimen other than nasopharyngeal swab, presence of  viral mutation(s) within the areas targeted by this assay, and inadequate number of viral copies(<138 copies/mL). A negative result must be combined with clinical observations, patient history, and epidemiological information. The expected result is Negative.  Fact Sheet for Patients:  EntrepreneurPulse.com.au  Fact Sheet for Healthcare Providers:  IncredibleEmployment.be  This test is no                          t yet approved or cleared by the [place] FDA and  has been authorized for detection and/or diagnosis of SARS-CoV-2 by FDA under an Emergency Use Authorization (EUA). This EUA will remain  in effect (meaning this test can be used) for the duration of the COVID-19 declaration under Section 564(b)(1) of the Act, 21 U.S.C.section 360bbb-3(b)(1), unless the authorization is terminated  or revoked sooner.       Influenza A by PCR 11/22/2021 NEGATIVE  NEGATIVE Final   Influenza B by PCR 11/22/2021 NEGATIVE  NEGATIVE Final   Comment: (NOTE) The Xpert Xpress SARS-CoV-2/FLU/RSV plus assay is intended as an aid in the diagnosis of influenza from Nasopharyngeal swab specimens and should not be used as a sole basis for treatment. Nasal washings and aspirates are unacceptable for Xpert Xpress SARS-CoV-2/FLU/RSV testing.  Fact Sheet for Patients: EntrepreneurPulse.com.au  Fact Sheet for Healthcare Providers: IncredibleEmployment.be  This test is not yet approved or cleared by the [place] FDA and has been authorized for detection and/or diagnosis of SARS-CoV-2 by FDA under an Emergency Use Authorization  (EUA). This EUA will remain in effect (meaning this test can be used) for the duration of the COVID-19 declaration under Section 564(b)(1) of the Act, 21 U.S.C. section 360bbb-3(b)(1), unless the authorization is terminated or revoked.  Performed at [facility], [place] [address], [place], [place] [address]    POC Amphetamine UR 11/22/2021 None Detected  NONE DETECTED (Cut Off Level 1000 ng/mL) Final   POC Secobarbital (BAR) 11/22/2021 None Detected  NONE DETECTED (Cut Off Level 300 ng/mL) Final   POC Buprenorphine (BUP) 11/22/2021 None Detected  NONE DETECTED (Cut Off Level 10 ng/mL) Final   POC Oxazepam (BZO) 11/22/2021 None Detected  NONE DETECTED (Cut Off Level 300 ng/mL) Final   POC Cocaine UR 11/22/2021 None Detected  NONE DETECTED (Cut Off Level 300 ng/mL) Final   POC Methamphetamine UR 11/22/2021 Positive (A)  NONE DETECTED (Cut Off Level 1000 ng/mL) Final   POC Morphine 11/22/2021 None Detected  NONE DETECTED (Cut Off Level 300 ng/mL) Final   POC Methadone UR 11/22/2021 None Detected  NONE DETECTED (Cut Off Level 300 ng/mL) Final   POC Oxycodone UR 11/22/2021 None Detected  NONE DETECTED (Cut Off Level 100 ng/mL) Final   POC Marijuana UR 11/22/2021 None Detected  NONE DETECTED (Cut Off Level 50 ng/mL) Final   SARSCOV2ONAVIRUS 2 AG 11/22/2021 NEGATIVE  NEGATIVE Final   Comment: (NOTE) SARS-CoV-2 antigen NOT DETECTED.   Negative results are presumptive.  Negative results do not preclude SARS-CoV-2 infection and should not be used as the sole basis for treatment or other patient management decisions, including infection  control decisions, particularly in the presence of clinical signs and  symptoms consistent with COVID-19, or in those who have been in contact with the virus.  Negative results must be combined with clinical observations, patient history, and epidemiological information. The expected result is Negative.  Fact Sheet for Patients:  HandmadeRecipes.com.cy  Fact Sheet for Healthcare Providers: FuneralLife.at  This test is not  yet approved or cleared by the [place] and  has been authorized for detection and/or diagnosis of SARS-CoV-2 by FDA under an Emergency Use Authorization (EUA).  This EUA will remain in effect (meaning this test can be used) for the duration of  the COV                          ID-19 declaration under Section 564(b)(1) of the Act, 21 U.S.C. section 360bbb-3(b)(1), unless the authorization is terminated or revoked sooner.     Preg Test, Ur 11/22/2021 NEGATIVE  NEGATIVE Final   Comment:        THE SENSITIVITY OF THIS METHODOLOGY IS >24 mIU/mL     Blood Alcohol level:  Lab Results  Component Value Date   ETH 159 (H) 123XX123    Metabolic Disorder Labs: No results found for: "HGBA1C", "MPG" No results found for: "PROLACTIN" No results found for: "CHOL", "TRIG", "HDL", "CHOLHDL", "VLDL", "LDLCALC"  Therapeutic Lab Levels: No results found for: "LITHIUM" No results found for: "VALPROATE" No results found for: "CBMZ"  Physical Findings   PHQ2-9    [place] ED from 11/22/2021 in [facility]  PHQ-2 Total Score 1      [place] ED from 11/22/2021 in [facility] ED from 05/18/2021 in [place] No Risk No Risk        Musculoskeletal  Strength & Muscle Tone: within normal limits Gait & Station: normal Patient leans: N/A  Psychiatric Specialty Exam  Presentation  General Appearance:  Appropriate for Environment; Casual  Eye Contact: Good  Speech: Clear and Coherent; Normal Rate  Speech Volume: Normal  Handedness: Right   Mood and Affect  Mood: Euthymic  Affect: congruent  Thought Process  Thought Processes: Coherent; Goal Directed; Linear  Descriptions of  Associations:Intact  Orientation:Full (Time, Place and Person)  Thought Content:Logical; WDL  Diagnosis of Schizophrenia or Schizoaffective disorder in past: No    Hallucinations:No data recorded  Ideas of Reference:None  Suicidal Thoughts:No data recorded  Homicidal Thoughts:No data recorded   Sensorium  Memory: Immediate Good; Recent Good  Judgment: Fair  Insight: Fair   Community education officer  Concentration: Good  Attention Span: Good  Recall: Good  Fund of Knowledge: Good  Language: Good   Psychomotor Activity  Psychomotor Activity: No data recorded   Assets  Assets: Communication Skills; Desire for Improvement; Financial Resources/Insurance; Housing; Intimacy; Leisure Time; Physical Health; Resilience; Social Support   Sleep  Sleep: No data recorded   No data recorded   Physical Exam  Physical Exam Constitutional:      Appearance: the patient is not toxic-appearing.  Pulmonary:     Effort: Pulmonary effort is normal.  Neurological:     General: No focal deficit present.     Mental Status: the patient is alert and oriented to person, place, and time.   Review of Systems  Respiratory:  Negative for shortness of breath.   Cardiovascular:  Negative for chest pain.  Gastrointestinal:  Negative for abdominal pain, constipation, diarrhea, nausea and vomiting.  Neurological:  Negative for headaches.   Blood pressure 113/85, pulse 71, temperature 98.6 F (37 C), temperature source Oral, resp. rate 18, height 5' (1.524 m), weight 110 lb (49.9 kg), SpO2 100 %. Body mass index is 21.48 kg/m.  Treatment Plan Summary: Daily contact with patient to assess and evaluate symptoms and progress in treatment  and Medication management   Status: Voluntary  Restarted Zoloft 50 mg for depression  Unable to obtain lab work even after multiple attempts by 3 different RNs.  Patient does have some history of anemia but presently denies any symptoms.   Urine pregnancy test was negative.  Dispo: Referral made to [facility] residential  Corky Sox, MD 11/24/2021 2:37 PM

## 2021-11-24 NOTE — ED Notes (Signed)
Writer attempted a blood draw x 1 stick unsuccessfully. Encouraged pt to drink plenty of fluids. Pt verbalized agreement. Will monitor for safety.

## 2021-11-24 NOTE — ED Notes (Signed)
Patient currently on phone. No acute distress noted. When asked, pt denies intent to harm self/others when asked. Denies A/VH. Patient denies any physical complaints when asked. Encouraged patient to notify staff if thoughts of harm toward self or others arise. Patient verbalize understanding and agreement. Will continue to monitor for safety.

## 2021-11-24 NOTE — ED Notes (Signed)
Pt sleeping at present. No distress noted. Will continue to monitor for safety 

## 2021-11-24 NOTE — ED Notes (Signed)
Pt resting in bed. A&O x4, calm and cooperative. Denies current SI/HI/AVH. No signs of distress noted. Monitoring for safety. 

## 2021-11-24 NOTE — ED Notes (Signed)
Patient A&Ox4. Denies intent to harm self/others when asked. Denies A/VH. Patient denies any physical complaints when asked. No acute distress noted. Routine safety checks conducted according to facility protocol. Encouraged patient to notify staff if thoughts of harm toward self or others arise. Patient verbalize understanding and agreement. Will continue to monitor for safety.    

## 2021-11-24 NOTE — ED Notes (Signed)
Pt sitting in dayroom in no acute distress. Denies SI/HI//AVH. Denies withdrawal sx from ETOH. Pleasant and cooperative. Informed pt to notify staff with any needs. Will continue to monitor for safety.

## 2021-11-24 NOTE — ED Notes (Signed)
Pt sleeping in no acute distress. RR even and unlabored. Safety maintianed.

## 2021-11-24 NOTE — ED Notes (Signed)
Pt asleep in bed. Respirations even and unlabored. Monitoring for safety. 

## 2021-11-25 DIAGNOSIS — F32A Depression, unspecified: Secondary | ICD-10-CM | POA: Diagnosis not present

## 2021-11-25 DIAGNOSIS — F1010 Alcohol abuse, uncomplicated: Secondary | ICD-10-CM | POA: Diagnosis not present

## 2021-11-25 DIAGNOSIS — F411 Generalized anxiety disorder: Secondary | ICD-10-CM | POA: Diagnosis not present

## 2021-11-25 DIAGNOSIS — F1090 Alcohol use, unspecified, uncomplicated: Secondary | ICD-10-CM | POA: Diagnosis not present

## 2021-11-25 DIAGNOSIS — Z1152 Encounter for screening for COVID-19: Secondary | ICD-10-CM | POA: Diagnosis not present

## 2021-11-25 NOTE — ED Notes (Signed)
Patient awoke for breakfast however returned to room and bed shortly afterward.  Patient is calm and cooperative without complaint or distress.  Will monitor.

## 2021-11-25 NOTE — ED Notes (Signed)
Pt is alert and oriented.  Pt has bright affect and congruent mood.  Pt attended [place] meeting and appeared to be engaged.

## 2021-11-25 NOTE — ED Notes (Signed)
Pt asleep in bed. Respirations even and unlabored. Monitoring for safety. 

## 2021-11-25 NOTE — Progress Notes (Signed)
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  Patient has been calm, cooperative and without distress or complaint.  Patient resting in bed at present.  Denies avh shi or plan.  Will monitor and provide safe environment.

## 2021-11-25 NOTE — ED Notes (Signed)
Snacks given 

## 2021-11-25 NOTE — ED Provider Notes (Signed)
Behavioral Health Progress Note  Date and Time: 11/25/2021 12:51 PM Name: Lindsey Velazquez MRN:  [id number]  Subjective:   Lindsey Velazquez is a 31 yr old female who presented to [facility] on 10/11 and was admitted to [facility] for EtOH Detox.  PPHx is significant for Depression, GAD, and EtOH Abuse, and no History of Suicide Attempts, Self Injurious Behavior, or Hospitalizations.   She reports that he is doing okay today.  She reports that she is not having any withdrawal symptoms today.  She reports she is not having any cravings.  She reports her sleep was good last night.  She reports her appetite is doing good.  She reports she is not having any side effects from Zoloft.  She reports she is still interested in residential treatment but that she is unsure with the resources or since she is in [place].  Discussed that social work would be able to provide her with more information and she reported understanding.  She reports no SI, HI, or AVH.  She reports no other concerns at present.  Diagnosis:  Final diagnoses:  Alcohol use disorder  Depressive disorder  Generalized anxiety disorder    Total Time spent with patient: 20 minutes  Past Psychiatric History: Depression, GAD, and EtOH Abuse, and no History of Suicide Attempts, Self Injurious Behavior, or Hospitalizations.  Past Medical History:  Past Medical History:  Diagnosis Date   Anxiety    Asthma    childhood   Depression    Eczema    HPV in female     Past Surgical History:  Procedure Laterality Date   NO PAST SURGERIES     Family History:  Family History  Problem Relation Age of Onset   Diabetes Mother    Hypertension Mother    Heart disease Mother    Thyroid disease Sister    Diabetes Sister    Asthma Father    Diabetes Maternal Aunt    Cancer Paternal Uncle    Family Psychiatric  History: Reports None Social History:  Social History   Substance and Sexual Activity  Alcohol Use Yes     Social History    Substance and Sexual Activity  Drug Use Yes   Types: Cocaine   Comment: occassionally    Social History   Socioeconomic History   Marital status: Single    Spouse name: Not on file   Number of children: Not on file   Years of education: Not on file   Highest education level: Not on file  Occupational History   Not on file  Tobacco Use   Smoking status: Former    Packs/day: 0.50    Types: Cigarettes   Smokeless tobacco: Never  Substance and Sexual Activity   Alcohol use: Yes   Drug use: Yes    Types: Cocaine    Comment: occassionally   Sexual activity: Yes    Birth control/protection: None  Other Topics Concern   Not on file  Social History Narrative   Not on file   Social Determinants of Health   Financial Resource Strain: Not on file  Food Insecurity: Not on file  Transportation Needs: Not on file  Physical Activity: Not on file  Stress: Not on file  Social Connections: Not on file   SDOH:  SDOH Screenings   Depression (PHQ2-9): Low Risk  (11/25/2021)  Tobacco Use: Medium Risk (05/18/2021)   Additional Social History:    Pain Medications: none reported Prescriptions: none reported Over the Counter: none  reported History of alcohol / drug use?: Yes Longest period of sobriety (when/how long): unable to assess Negative Consequences of Use:  (none reported by patient) Withdrawal Symptoms: Agitation, Irritability Name of Substance 1: Alcohol 1 - Age of First Use: 17 1 - Amount (size/oz): Patient reports she drinks a half pint of hard liquor about 2-3 times a week. 1 - Frequency: 2-3 times a week. 1 - Duration: Patient reports she has been drinking since the age of 42, however reports an increase in use over the past 4-5 months 1 - Last Use / Amount: prior to admission 1- Route of Use: Drinking                  Sleep: Good  Appetite:  Good  Current Medications:  Current Facility-Administered Medications  Medication Dose Route Frequency  Provider Last Rate Last Admin   acetaminophen (TYLENOL) tablet 650 mg  650 mg Oral Q6H PRN Lenard Lance, FNP       alum & mag hydroxide-simeth (MAALOX/MYLANTA) 200-200-20 MG/5ML suspension 30 mL  30 mL Oral Q4H PRN Lenard Lance, FNP       hydrOXYzine (ATARAX) tablet 25 mg  25 mg Oral TID PRN Lenard Lance, FNP       magnesium hydroxide (MILK OF MAGNESIA) suspension 30 mL  30 mL Oral Daily PRN Lenard Lance, FNP       multivitamin with minerals tablet 1 tablet  1 tablet Oral Daily Lenard Lance, FNP   1 tablet at 11/25/21 0947   nicotine (NICODERM CQ - dosed in mg/24 hours) patch 14 mg  14 mg Transdermal Daily Lenard Lance, FNP   14 mg at 11/25/21 0947   sertraline (ZOLOFT) tablet 50 mg  50 mg Oral Daily Carlyn Reichert, MD   50 mg at 11/25/21 0947   thiamine (VITAMIN B1) tablet 100 mg  100 mg Oral Daily Lenard Lance, FNP   100 mg at 11/25/21 0947   traZODone (DESYREL) tablet 50 mg  50 mg Oral QHS PRN Lenard Lance, FNP   50 mg at 11/24/21 2134   No current outpatient medications on file.    Labs  Lab Results:  Admission on 11/22/2021  Component Date Value Ref Range Status   SARS Coronavirus 2 by RT PCR 11/22/2021 NEGATIVE  NEGATIVE Final   Comment: (NOTE) SARS-CoV-2 target nucleic acids are NOT DETECTED.  The SARS-CoV-2 RNA is generally detectable in upper respiratory specimens during the acute phase of infection. The lowest concentration of SARS-CoV-2 viral copies this assay can detect is 138 copies/mL. A negative result does not preclude SARS-Cov-2 infection and should not be used as the sole basis for treatment or other patient management decisions. A negative result may occur with  improper specimen collection/handling, submission of specimen other than nasopharyngeal swab, presence of viral mutation(s) within the areas targeted by this assay, and inadequate number of viral copies(<138 copies/mL). A negative result must be combined with clinical observations, patient history,  and epidemiological information. The expected result is Negative.  Fact Sheet for Patients:  BloggerCourse.com  Fact Sheet for Healthcare Providers:  SeriousBroker.it  This test is no                          t yet approved or cleared by the [place] FDA and  has been authorized for detection and/or diagnosis of SARS-CoV-2 by FDA under an Emergency Use Authorization (EUA). This EUA will  remain  in effect (meaning this test can be used) for the duration of the COVID-19 declaration under Section 564(b)(1) of the Act, 21 U.S.C.section 360bbb-3(b)(1), unless the authorization is terminated  or revoked sooner.       Influenza A by PCR 11/22/2021 NEGATIVE  NEGATIVE Final   Influenza B by PCR 11/22/2021 NEGATIVE  NEGATIVE Final   Comment: (NOTE) The Xpert Xpress SARS-CoV-2/FLU/RSV plus assay is intended as an aid in the diagnosis of influenza from Nasopharyngeal swab specimens and should not be used as a sole basis for treatment. Nasal washings and aspirates are unacceptable for Xpert Xpress SARS-CoV-2/FLU/RSV testing.  Fact Sheet for Patients: BloggerCourse.com  Fact Sheet for Healthcare Providers: SeriousBroker.it  This test is not yet approved or cleared by the [place] FDA and has been authorized for detection and/or diagnosis of SARS-CoV-2 by FDA under an Emergency Use Authorization (EUA). This EUA will remain in effect (meaning this test can be used) for the duration of the COVID-19 declaration under Section 564(b)(1) of the Act, 21 U.S.C. section 360bbb-3(b)(1), unless the authorization is terminated or revoked.  Performed at [facility], [address], [place], [place] [address]    POC Amphetamine UR 11/22/2021 None Detected  NONE DETECTED (Cut Off Level 1000 ng/mL) Final   POC Secobarbital (BAR) 11/22/2021 None Detected  NONE DETECTED (Cut Off Level 300  ng/mL) Final   POC Buprenorphine (BUP) 11/22/2021 None Detected  NONE DETECTED (Cut Off Level 10 ng/mL) Final   POC Oxazepam (BZO) 11/22/2021 None Detected  NONE DETECTED (Cut Off Level 300 ng/mL) Final   POC Cocaine UR 11/22/2021 None Detected  NONE DETECTED (Cut Off Level 300 ng/mL) Final   POC Methamphetamine UR 11/22/2021 Positive (A)  NONE DETECTED (Cut Off Level 1000 ng/mL) Final   POC Morphine 11/22/2021 None Detected  NONE DETECTED (Cut Off Level 300 ng/mL) Final   POC Methadone UR 11/22/2021 None Detected  NONE DETECTED (Cut Off Level 300 ng/mL) Final   POC Oxycodone UR 11/22/2021 None Detected  NONE DETECTED (Cut Off Level 100 ng/mL) Final   POC Marijuana UR 11/22/2021 None Detected  NONE DETECTED (Cut Off Level 50 ng/mL) Final   SARSCOV2ONAVIRUS 2 AG 11/22/2021 NEGATIVE  NEGATIVE Final   Comment: (NOTE) SARS-CoV-2 antigen NOT DETECTED.   Negative results are presumptive.  Negative results do not preclude SARS-CoV-2 infection and should not be used as the sole basis for treatment or other patient management decisions, including infection  control decisions, particularly in the presence of clinical signs and  symptoms consistent with COVID-19, or in those who have been in contact with the virus.  Negative results must be combined with clinical observations, patient history, and epidemiological information. The expected result is Negative.  Fact Sheet for Patients: https://www.jennings-kim.com/  Fact Sheet for Healthcare Providers: https://Khilee Hendricksen-rogers.biz/  This test is not yet approved or cleared by the [place] FDA and  has been authorized for detection and/or diagnosis of SARS-CoV-2 by FDA under an Emergency Use Authorization (EUA).  This EUA will remain in effect (meaning this test can be used) for the duration of  the COV                          ID-19 declaration under Section 564(b)(1) of the Act, 21 U.S.C. section 360bbb-3(b)(1),  unless the authorization is terminated or revoked sooner.     Preg Test, Ur 11/22/2021 NEGATIVE  NEGATIVE Final   Comment:  THE SENSITIVITY OF THIS METHODOLOGY IS >24 mIU/mL     Blood Alcohol level:  Lab Results  Component Value Date   ETH 159 (H) 05/18/2021    Metabolic Disorder Labs: No results found for: "HGBA1C", "MPG" No results found for: "PROLACTIN" No results found for: "CHOL", "TRIG", "HDL", "CHOLHDL", "VLDL", "LDLCALC"  Therapeutic Lab Levels: No results found for: "LITHIUM" No results found for: "VALPROATE" No results found for: "CBMZ"  Physical Findings   PHQ2-9    [facility]  PHQ-2 Total Score 2  PHQ-9 Total Score 3      [facility]18/2021 in [facility]: [facility]  C-SSRS RISK CATEGORY No Risk No Risk        Musculoskeletal  Strength & Muscle Tone: within normal limits Gait & Station: normal Patient leans: N/A  Psychiatric Specialty Exam  Presentation  General Appearance:  Appropriate for Environment; Casual  Eye Contact: Good  Speech: Clear and Coherent; Normal Rate  Speech Volume: Normal  Handedness: Right   Mood and Affect  Mood: Dysphoric  Affect: Congruent   Thought Process  Thought Processes: Coherent; Goal Directed  Descriptions of Associations:Intact  Orientation:Full (Time, Place and Person)  Thought Content:Logical; WDL  Diagnosis of Schizophrenia or Schizoaffective disorder in past: No    Hallucinations:Hallucinations: None  Ideas of Reference:None  Suicidal Thoughts:Suicidal Thoughts: No  Homicidal Thoughts:Homicidal Thoughts: No   Sensorium  Memory: Immediate Fair; Recent Fair  Judgment: Fair  Insight: Fair   Art therapistxecutive Functions  Concentration: Good  Attention Span: Good  Recall: Good  Fund of  Knowledge: Good  Language: Good   Psychomotor Activity  Psychomotor Activity:Psychomotor Activity: Normal   Assets  Assets: Communication Skills; Desire for Improvement; Resilience; Physical Health; Social Support   Sleep  Sleep:Sleep: Good   No data recorded  Physical Exam  Physical Exam Vitals and nursing note reviewed.  Constitutional:      General: She is not in acute distress.    Appearance: Normal appearance. She is normal weight. She is not ill-appearing or toxic-appearing.  HENT:     Head: Normocephalic and atraumatic.  Pulmonary:     Effort: Pulmonary effort is normal.  Musculoskeletal:        General: Normal range of motion.  Neurological:     General: No focal deficit present.     Mental Status: She is alert.    Review of Systems  Respiratory:  Negative for cough and shortness of breath.   Cardiovascular:  Negative for chest pain.  Gastrointestinal:  Negative for abdominal pain, constipation, diarrhea, nausea and vomiting.  Neurological:  Negative for dizziness, weakness and headaches.  Psychiatric/Behavioral:  Negative for depression, hallucinations and suicidal ideas. The patient is not nervous/anxious.    Blood pressure 128/80, pulse 98, temperature 98 F (36.7 C), temperature source Oral, resp. rate 18, height 5' (1.524 m), weight 110 lb (49.9 kg), SpO2 100 %. Body mass index is 21.48 kg/m.  Treatment Plan Summary: Daily contact with patient to assess and evaluate symptoms and progress in treatment and Medication management  Vonzella NippleJessica Hotard is a 31 yr old female [facility].  PPHx is significant for Depression, GAD, and EtOH Abuse, and no History of Suicide Attempts, Self Injurious Behavior, or Hospitalizations.    Shanda BumpsJessica is tolerating her detox well and will be finishing  her Ativan taper today.  She is not having any withdrawal symptoms at this time.  She is still interested in  residential treatment and will work with social work on Monday.  She has tolerated starting Zoloft without any issues.  We will not make any medication changes at this time.  We will continue to monitor.   Withdrawal: -Continue CIWA, last score=0  @ 10/14  0600 -Continue Ativan taper to end 10/14 -Continue Ativan 1 mg q6 PRN CIWA>10 -Continue Imodium 2-4 mg PRN diarrhea -Continue Zofran-ODT 4 mg q6 PRN nausea -Continue Thiamine 100 mg daily for nutritional supplementation -Continue Multivitamin daily for nutritional supplementation   Depression  GAD: -Continue Zoloft 50 mg daily   Nicotine Dependence: -Continue Nicotine Patch 14 mg daily   -Continue PRN's: Tylenol, Maalox, Atarax, Milk of Magnesia, Trazodone    Dispo: [facility] Briant Cedar, MD 11/25/2021 12:51 PM

## 2021-11-25 NOTE — ED Notes (Signed)
Pt is in AA group meeting 

## 2021-11-26 DIAGNOSIS — F32A Depression, unspecified: Secondary | ICD-10-CM | POA: Diagnosis not present

## 2021-11-26 DIAGNOSIS — F1090 Alcohol use, unspecified, uncomplicated: Secondary | ICD-10-CM | POA: Diagnosis not present

## 2021-11-26 DIAGNOSIS — Z1152 Encounter for screening for COVID-19: Secondary | ICD-10-CM | POA: Diagnosis not present

## 2021-11-26 DIAGNOSIS — F411 Generalized anxiety disorder: Secondary | ICD-10-CM | POA: Diagnosis not present

## 2021-11-26 DIAGNOSIS — F1010 Alcohol abuse, uncomplicated: Secondary | ICD-10-CM | POA: Diagnosis not present

## 2021-11-26 NOTE — ED Notes (Signed)
Patient awake for breakfast.  Calm, pleasant organized and logical.  Patient is cooperative with care and comfortable in the environment.  Patient is without distress or complaint.  Makes needs known.  Will monitor.

## 2021-11-26 NOTE — ED Provider Notes (Signed)
Behavioral Health Progress Note  Date and Time: 11/26/2021 1:50 PM Name: Lindsey Velazquez MRN:  [id number]  Subjective:   Lindsey Velazquez is a 31 yr old female who presented to [facility] on 10/11 and was admitted to [facility] for EtOH Detox.  PPHx is significant for Depression, GAD, and EtOH Abuse, and no History of Suicide Attempts, Self Injurious Behavior, or Hospitalizations.   She reports that she is doing okay today.  She reports that she is having no withdrawal symptoms.  She reports she is having no cravings.  She reports her main concern is getting her life back together.  She reports that she is frustrated by her benefits not being transferred.  She reports that while she was living in [facility] in [facility] and that her food stamps have appropriately changed counties but does not know about her health benefits.  She asked if social work can assist her with this and help her get into residential treatment.  Discussed that social work will be in tomorrow and can assist her with this.  She reported understanding.  She reports no SI, HI, or AVH.  She reports she slept well last night.  She reports her appetite is doing good.  She reports no other concerns at present.  Diagnosis:  Final diagnoses:  Alcohol use disorder  Depressive disorder  Generalized anxiety disorder    Total Time spent with patient: 20 minutes  Past Psychiatric History: Depression, GAD, and EtOH Abuse, and no History of Suicide Attempts, Self Injurious Behavior, or Hospitalizations.  Past Medical History:  Past Medical History:  Diagnosis Date   Anxiety    Asthma    childhood   Depression    Eczema    HPV in female     Past Surgical History:  Procedure Laterality Date   NO PAST SURGERIES     Family History:  Family History  Problem Relation Age of Onset   Diabetes Mother    Hypertension Mother    Heart disease Mother    Thyroid disease Sister    Diabetes Sister    Asthma Father    Diabetes Maternal  Aunt    Cancer Paternal Uncle    Family Psychiatric  History: Reports None Social History:  Social History   Substance and Sexual Activity  Alcohol Use Yes     Social History   Substance and Sexual Activity  Drug Use Yes   Types: Cocaine   Comment: occassionally    Social History   Socioeconomic History   Marital status: Single    Spouse name: Not on file   Number of children: Not on file   Years of education: Not on file   Highest education level: Not on file  Occupational History   Not on file  Tobacco Use   Smoking status: Former    Packs/day: 0.50    Types: Cigarettes   Smokeless tobacco: Never  Substance and Sexual Activity   Alcohol use: Yes   Drug use: Yes    Types: Cocaine    Comment: occassionally   Sexual activity: Yes    Birth control/protection: None  Other Topics Concern   Not on file  Social History Narrative   Not on file   Social Determinants of Health   Financial Resource Strain: Not on file  Food Insecurity: Not on file  Transportation Needs: Not on file  Physical Activity: Not on file  Stress: Not on file  Social Connections: Not on file   SDOH:  SDOH  Screenings   Depression (PHQ2-9): Low Risk  (11/25/2021)  Tobacco Use: Medium Risk (05/18/2021)   Additional Social History:    Pain Medications: none reported Prescriptions: none reported Over the Counter: none reported History of alcohol / drug use?: Yes Longest period of sobriety (when/how long): unable to assess Negative Consequences of Use:  (none reported by patient) Withdrawal Symptoms: Agitation, Irritability Name of Substance 1: Alcohol 1 - Age of First Use: 17 1 - Amount (size/oz): Patient reports she drinks a half pint of hard liquor about 2-3 times a week. 1 - Frequency: 2-3 times a week. 1 - Duration: Patient reports she has been drinking since the age of 22, however reports an increase in use over the past 4-5 months 1 - Last Use / Amount: prior to admission 1-  Route of Use: Drinking                  Sleep: Good  Appetite:  Good  Current Medications:  Current Facility-Administered Medications  Medication Dose Route Frequency Provider Last Rate Last Admin   acetaminophen (TYLENOL) tablet 650 mg  650 mg Oral Q6H PRN Lenard Lance, FNP       alum & mag hydroxide-simeth (MAALOX/MYLANTA) 200-200-20 MG/5ML suspension 30 mL  30 mL Oral Q4H PRN Lenard Lance, FNP       hydrOXYzine (ATARAX) tablet 25 mg  25 mg Oral TID PRN Lenard Lance, FNP       magnesium hydroxide (MILK OF MAGNESIA) suspension 30 mL  30 mL Oral Daily PRN Lenard Lance, FNP       multivitamin with minerals tablet 1 tablet  1 tablet Oral Daily Lenard Lance, FNP   1 tablet at 11/26/21 6270   nicotine (NICODERM CQ - dosed in mg/24 hours) patch 14 mg  14 mg Transdermal Daily Lenard Lance, FNP   14 mg at 11/26/21 0927   sertraline (ZOLOFT) tablet 50 mg  50 mg Oral Daily Carlyn Reichert, MD   50 mg at 11/26/21 3500   thiamine (VITAMIN B1) tablet 100 mg  100 mg Oral Daily Lenard Lance, FNP   100 mg at 11/26/21 0927   traZODone (DESYREL) tablet 50 mg  50 mg Oral QHS PRN Lenard Lance, FNP   50 mg at 11/25/21 2133   No current outpatient medications on file.    Labs  Lab Results:  Admission on 11/22/2021  Component Date Value Ref Range Status   SARS Coronavirus 2 by RT PCR 11/22/2021 NEGATIVE  NEGATIVE Final   Comment: (NOTE) SARS-CoV-2 target nucleic acids are NOT DETECTED.  The SARS-CoV-2 RNA is generally detectable in upper respiratory specimens during the acute phase of infection. The lowest concentration of SARS-CoV-2 viral copies this assay can detect is 138 copies/mL. A negative result does not preclude SARS-Cov-2 infection and should not be used as the sole basis for treatment or other patient management decisions. A negative result may occur with  improper specimen collection/handling, submission of specimen other than nasopharyngeal swab, presence of viral  mutation(s) within the areas targeted by this assay, and inadequate number of viral copies(<138 copies/mL). A negative result must be combined with clinical observations, patient history, and epidemiological information. The expected result is Negative.  Fact Sheet for Patients:  BloggerCourse.com  Fact Sheet for Healthcare Providers:  SeriousBroker.it  This test is no  t yet approved or cleared by the [place] and  has been authorized for detection and/or diagnosis of SARS-CoV-2 by FDA under an Emergency Use Authorization (EUA). This EUA will remain  in effect (meaning this test can be used) for the duration of the COVID-19 declaration under Section 564(b)(1) of the Act, 21 U.S.C.section 360bbb-3(b)(1), unless the authorization is terminated  or revoked sooner.       Influenza A by PCR 11/22/2021 NEGATIVE  NEGATIVE Final   Influenza B by PCR 11/22/2021 NEGATIVE  NEGATIVE Final   Comment: (NOTE) The Xpert Xpress SARS-CoV-2/FLU/RSV plus assay is intended as an aid in the diagnosis of influenza from Nasopharyngeal swab specimens and should not be used as a sole basis for treatment. Nasal washings and aspirates are unacceptable for Xpert Xpress SARS-CoV-2/FLU/RSV testing.  Fact Sheet for Patients: BloggerCourse.com  Fact Sheet for Healthcare Providers: SeriousBroker.it  This test is not yet approved or cleared by the [place] FDA and has been authorized for detection and/or diagnosis of SARS-CoV-2 by FDA under an Emergency Use Authorization (EUA). This EUA will remain in effect (meaning this test can be used) for the duration of the COVID-19 declaration under Section 564(b)(1) of the Act, 21 U.S.C. section 360bbb-3(b)(1), unless the authorization is terminated or revoked.  Performed at [facility], [address], [place],  [place] [address]    POC Amphetamine UR 11/22/2021 None Detected  NONE DETECTED (Cut Off Level 1000 ng/mL) Final   POC Secobarbital (BAR) 11/22/2021 None Detected  NONE DETECTED (Cut Off Level 300 ng/mL) Final   POC Buprenorphine (BUP) 11/22/2021 None Detected  NONE DETECTED (Cut Off Level 10 ng/mL) Final   POC Oxazepam (BZO) 11/22/2021 None Detected  NONE DETECTED (Cut Off Level 300 ng/mL) Final   POC Cocaine UR 11/22/2021 None Detected  NONE DETECTED (Cut Off Level 300 ng/mL) Final   POC Methamphetamine UR 11/22/2021 Positive (A)  NONE DETECTED (Cut Off Level 1000 ng/mL) Final   POC Morphine 11/22/2021 None Detected  NONE DETECTED (Cut Off Level 300 ng/mL) Final   POC Methadone UR 11/22/2021 None Detected  NONE DETECTED (Cut Off Level 300 ng/mL) Final   POC Oxycodone UR 11/22/2021 None Detected  NONE DETECTED (Cut Off Level 100 ng/mL) Final   POC Marijuana UR 11/22/2021 None Detected  NONE DETECTED (Cut Off Level 50 ng/mL) Final   SARSCOV2ONAVIRUS 2 AG 11/22/2021 NEGATIVE  NEGATIVE Final   Comment: (NOTE) SARS-CoV-2 antigen NOT DETECTED.   Negative results are presumptive.  Negative results do not preclude SARS-CoV-2 infection and should not be used as the sole basis for treatment or other patient management decisions, including infection  control decisions, particularly in the presence of clinical signs and  symptoms consistent with COVID-19, or in those who have been in contact with the virus.  Negative results must be combined with clinical observations, patient history, and epidemiological information. The expected result is Negative.  Fact Sheet for Patients: https://www.jennings-kim.com/  Fact Sheet for Healthcare Providers: https://Sayyid Harewood-rogers.biz/  This test is not yet approved or cleared by the [place] FDA and  has been authorized for detection and/or diagnosis of SARS-CoV-2 by FDA under an Emergency Use Authorization (EUA).  This EUA will remain  in effect (meaning this test can be used) for the duration of  the COV                          ID-19 declaration under Section 564(b)(1) of the  Act, 21 U.S.C. section 360bbb-3(b)(1), unless the authorization is terminated or revoked sooner.     Preg Test, Ur 11/22/2021 NEGATIVE  NEGATIVE Final   Comment:        THE SENSITIVITY OF THIS METHODOLOGY IS >24 mIU/mL     Blood Alcohol level:  Lab Results  Component Value Date   ETH 159 (H) 05/18/2021    Metabolic Disorder Labs: No results found for: "HGBA1C", "MPG" No results found for: "PROLACTIN" No results found for: "CHOL", "TRIG", "HDL", "CHOLHDL", "VLDL", "LDLCALC"  Therapeutic Lab Levels: No results found for: "LITHIUM" No results found for: "VALPROATE" No results found for: "CBMZ"  Physical Findings   PHQ2-9    Flowsheet Row ED from 11/22/2021 in [facility]  PHQ-2 Total Score 2  PHQ-9 Total Score 3      Flowsheet Row ED from 11/22/2021 in [facility] ED from 05/18/2021 in [facility] EMERGENCY DEPARTMENT  C-SSRS RISK CATEGORY No Risk No Risk        Musculoskeletal  Strength & Muscle Tone: within normal limits Gait & Station: normal Patient leans: N/A  Psychiatric Specialty Exam  Presentation  General Appearance:  Appropriate for Environment; Casual  Eye Contact: Good  Speech: Clear and Coherent; Normal Rate  Speech Volume: Normal  Handedness: Right   Mood and Affect  Mood: -- ("ok")  Affect: Non-Congruent (irritable)   Thought Process  Thought Processes: Coherent; Goal Directed  Descriptions of Associations:Intact  Orientation:Full (Time, Place and Person)  Thought Content:Logical; WDL  Diagnosis of Schizophrenia or Schizoaffective disorder in past: No    Hallucinations:Hallucinations: None  Ideas of Reference:None  Suicidal Thoughts:Suicidal Thoughts: No  Homicidal Thoughts:Homicidal Thoughts:  No   Sensorium  Memory: Immediate Fair; Recent Fair  Judgment: Fair  Insight: Fair   Art therapist  Concentration: Good  Attention Span: Good  Recall: Good  Fund of Knowledge: Good  Language: Good   Psychomotor Activity  Psychomotor Activity: Psychomotor Activity: Normal   Assets  Assets: Communication Skills; Desire for Improvement; Resilience; Physical Health; Social Support   Sleep  Sleep: Sleep: Good   No data recorded  Physical Exam  Physical Exam Vitals and nursing note reviewed.  Constitutional:      General: She is not in acute distress.    Appearance: Normal appearance. She is normal weight. She is not ill-appearing or toxic-appearing.  HENT:     Head: Normocephalic and atraumatic.  Pulmonary:     Effort: Pulmonary effort is normal.  Musculoskeletal:        General: Normal range of motion.  Neurological:     General: No focal deficit present.     Mental Status: She is alert.    Review of Systems  Respiratory:  Negative for cough and shortness of breath.   Cardiovascular:  Negative for chest pain.  Gastrointestinal:  Negative for abdominal pain, constipation, diarrhea, nausea and vomiting.  Neurological:  Negative for dizziness, weakness and headaches.  Psychiatric/Behavioral:  Negative for depression, hallucinations and suicidal ideas. The patient is not nervous/anxious.    Blood pressure 124/76, pulse 81, temperature 97.9 F (36.6 C), temperature source Oral, resp. rate 18, height 5' (1.524 m), weight 110 lb (49.9 kg), SpO2 100 %. Body mass index is 21.48 kg/m.  Treatment Plan Summary: Daily contact with patient to assess and evaluate symptoms and progress in treatment and Medication management  Ting Cage is a 31 yr old female who presented to [facility] on 10/11 and [facility].  PPHx is significant for Depression, GAD, and EtOH Abuse, and no History of Suicide Attempts, Self Injurious Behavior, or  Hospitalizations.      Parrie tolerated her Ativan taper without significant withdrawal symptoms.  Her main concern at this time is getting her benefits clarified so that she can go to residential treatment.  Discussed with her that social work would be able to assist her with this tomorrow.  We will not make any medication changes at this time.  We will continue to monitor.     Withdrawal: -Continue CIWA, last score=0  @ 10/15  0552 -Continue Ativan taper to end 10/14 -Continue Ativan 1 mg q6 PRN CIWA>10 -Continue Imodium 2-4 mg PRN diarrhea -Continue Zofran-ODT 4 mg q6 PRN nausea -Continue Thiamine 100 mg daily for nutritional supplementation -Continue Multivitamin daily for nutritional supplementation     Depression  GAD: -Continue Zoloft 50 mg daily     Nicotine Dependence: -Continue Nicotine Patch 14 mg daily     -Continue PRN's: Tylenol, Maalox, Atarax, Milk of Magnesia, Trazodone       Dispo: [facility]   Lauro FranklinAlexander S Eva Griffo, MD 11/26/2021 1:50 PM

## 2021-11-26 NOTE — ED Notes (Signed)
Pt asleep in bed. Respirations even and unlabored. Monitoring for safety. 

## 2021-11-26 NOTE — ED Notes (Signed)
Pt sitting in dining room watching TV. A&O x4, calm and cooperative. Denies current SI/HI/AVH. No signs of distress noted. Monitoring for safety.  

## 2021-11-26 NOTE — Progress Notes (Signed)
Patient attended a coping skills group and participated well.

## 2021-11-27 DIAGNOSIS — Z1152 Encounter for screening for COVID-19: Secondary | ICD-10-CM | POA: Diagnosis not present

## 2021-11-27 DIAGNOSIS — F1010 Alcohol abuse, uncomplicated: Secondary | ICD-10-CM | POA: Diagnosis not present

## 2021-11-27 DIAGNOSIS — F411 Generalized anxiety disorder: Secondary | ICD-10-CM | POA: Diagnosis not present

## 2021-11-27 DIAGNOSIS — F1090 Alcohol use, unspecified, uncomplicated: Secondary | ICD-10-CM | POA: Diagnosis not present

## 2021-11-27 DIAGNOSIS — F32A Depression, unspecified: Secondary | ICD-10-CM | POA: Diagnosis not present

## 2021-11-27 LAB — URINALYSIS, ROUTINE W REFLEX MICROSCOPIC
Bilirubin Urine: NEGATIVE
Glucose, UA: NEGATIVE mg/dL
Hgb urine dipstick: NEGATIVE
Ketones, ur: NEGATIVE mg/dL
Nitrite: NEGATIVE
Protein, ur: NEGATIVE mg/dL
Specific Gravity, Urine: 1.025 (ref 1.005–1.030)
WBC, UA: >50 WBC/hpf — ABNORMAL HIGH (ref 0–5)
pH: 5 (ref 5.0–8.0)

## 2021-11-27 MED ORDER — NITROFURANTOIN MONOHYD MACRO 100 MG PO CAPS
100.0000 mg | ORAL_CAPSULE | Freq: Two times a day (BID) | ORAL | Status: DC
  Administered 2021-11-27 – 2021-11-30 (×6): 100 mg via ORAL
  Filled 2021-11-27 (×5): qty 1
  Filled 2021-11-27: qty 8
  Filled 2021-11-27: qty 1

## 2021-11-27 NOTE — ED Notes (Signed)
Pt is in the bed sleeping. Respirations are even and unlabored. No acute distress noted. Will continue to monitor for safety. 

## 2021-11-27 NOTE — ED Notes (Signed)
Pt sitting in dayroom watching tv. Denies concerns at present. Informed pt to notify staff with any needs or concerns. Will continue to monitor for safety.

## 2021-11-27 NOTE — ED Provider Notes (Signed)
Behavioral Health Progress Note  Date and Time: 11/27/2021 5:57 PM Name: Lindsey Velazquez MRN:  [id number]  Subjective:   Lindsey Velazquez is a 31 year old female with a past psychiatric history of no major conditions.  She came to the [facility] behavioral health urgent care at the recommendation of her attorney because she has 2 previous DWI charges and an upcoming court date.  She reports that she uses alcohol on a regular basis and that this is an issue for her.  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  The patient desires residential rehab.  On interview and assessment today, the patient expresses frustration that she cannot attend [facility] residential as a result of her insurance being out of [place].  She is told to call her case manager for [facility] and ask about options for residential rehab will be in network for her.  She is observed throughout the day making phone calls and appears to be working hard towards finding herself for rehab.  It was also suggested to her that she call her lawyer to figure out what kind of treatment she needs to remove her DWI charges.  Patient denies experiencing suicidal thoughts.  She reports appropriate sleep and appetite.    Diagnosis:  Final diagnoses:  Alcohol use disorder  Depressive disorder  Generalized anxiety disorder    Total Time spent with patient: 30 minutes  Past Psychiatric History: as above Past Medical History:  Past Medical History:  Diagnosis Date   Anxiety    Asthma    childhood   Depression    Eczema    HPV in female     Past Surgical History:  Procedure Laterality Date   NO PAST SURGERIES     Family History:  Family History  Problem Relation Age of Onset   Diabetes Mother    Hypertension Mother    Heart disease Mother    Thyroid disease Sister    Diabetes Sister    Asthma Father    Diabetes Maternal Aunt    Cancer Paternal Uncle    Family Psychiatric  History: per H and P Social History:  Social History    Substance and Sexual Activity  Alcohol Use Yes     Social History   Substance and Sexual Activity  Drug Use Yes   Types: Cocaine   Comment: occassionally    Social History   Socioeconomic History   Marital status: Single    Spouse name: Not on file   Number of children: Not on file   Years of education: Not on file   Highest education level: Not on file  Occupational History   Not on file  Tobacco Use   Smoking status: Former    Packs/day: 0.50    Types: Cigarettes   Smokeless tobacco: Never  Substance and Sexual Activity   Alcohol use: Yes   Drug use: Yes    Types: Cocaine    Comment: occassionally   Sexual activity: Yes    Birth control/protection: None  Other Topics Concern   Not on file  Social History Narrative   Not on file   Social Determinants of Health   Financial Resource Strain: Not on file  Food Insecurity: Not on file  Transportation Needs: Not on file  Physical Activity: Not on file  Stress: Not on file  Social Connections: Not on file   SDOH:  SDOH Screenings   Depression (PHQ2-9): Low Risk  (11/25/2021)  Tobacco Use: Medium Risk (05/18/2021)   Additional Social History:  Pain Medications: none reported Prescriptions: none reported Over the Counter: none reported History of alcohol / drug use?: Yes Longest period of sobriety (when/how long): unable to assess Negative Consequences of Use:  (none reported by patient) Withdrawal Symptoms: Agitation, Irritability Name of Substance 1: Alcohol 1 - Age of First Use: 17 1 - Amount (size/oz): Patient reports she drinks a half pint of hard liquor about 2-3 times a week. 1 - Frequency: 2-3 times a week. 1 - Duration: Patient reports she has been drinking since the age of 32, however reports an increase in use over the past 4-5 months 1 - Last Use / Amount: prior to admission 1- Route of Use: Drinking                  Sleep: Fair  Appetite:  Fair  Current Medications:  Current  Facility-Administered Medications  Medication Dose Route Frequency Provider Last Rate Last Admin   acetaminophen (TYLENOL) tablet 650 mg  650 mg Oral Q6H PRN Lenard Lance, FNP       alum & mag hydroxide-simeth (MAALOX/MYLANTA) 200-200-20 MG/5ML suspension 30 mL  30 mL Oral Q4H PRN Lenard Lance, FNP       hydrOXYzine (ATARAX) tablet 25 mg  25 mg Oral TID PRN Lenard Lance, FNP   25 mg at 11/26/21 2149   magnesium hydroxide (MILK OF MAGNESIA) suspension 30 mL  30 mL Oral Daily PRN Lenard Lance, FNP       multivitamin with minerals tablet 1 tablet  1 tablet Oral Daily Lenard Lance, FNP   1 tablet at 11/27/21 0949   nicotine (NICODERM CQ - dosed in mg/24 hours) patch 14 mg  14 mg Transdermal Daily Lenard Lance, FNP   14 mg at 11/27/21 0950   sertraline (ZOLOFT) tablet 50 mg  50 mg Oral Daily Carlyn Reichert, MD   50 mg at 11/27/21 0949   thiamine (VITAMIN B1) tablet 100 mg  100 mg Oral Daily Lenard Lance, FNP   100 mg at 11/27/21 0949   traZODone (DESYREL) tablet 50 mg  50 mg Oral QHS PRN Lenard Lance, FNP   50 mg at 11/26/21 2149   No current outpatient medications on file.    Labs  Lab Results:  Admission on 11/22/2021  Component Date Value Ref Range Status   SARS Coronavirus 2 by RT PCR 11/22/2021 NEGATIVE  NEGATIVE Final   Comment: (NOTE) SARS-CoV-2 target nucleic acids are NOT DETECTED.  The SARS-CoV-2 RNA is generally detectable in upper respiratory specimens during the acute phase of infection. The lowest concentration of SARS-CoV-2 viral copies this assay can detect is 138 copies/mL. A negative result does not preclude SARS-Cov-2 infection and should not be used as the sole basis for treatment or other patient management decisions. A negative result may occur with  improper specimen collection/handling, submission of specimen other than nasopharyngeal swab, presence of viral mutation(s) within the areas targeted by this assay, and inadequate number of viral copies(<138  copies/mL). A negative result must be combined with clinical observations, patient history, and epidemiological information. The expected result is Negative.  Fact Sheet for Patients:  BloggerCourse.com  Fact Sheet for Healthcare Providers:  SeriousBroker.it  This test is no                          t yet approved or cleared by the [place] FDA and  has been authorized for detection and/or  diagnosis of SARS-CoV-2 by FDA under an Emergency Use Authorization (EUA). This EUA will remain  in effect (meaning this test can be used) for the duration of the COVID-19 declaration under Section 564(b)(1) of the Act, 21 U.S.C.section 360bbb-3(b)(1), unless the authorization is terminated  or revoked sooner.       Influenza A by PCR 11/22/2021 NEGATIVE  NEGATIVE Final   Influenza B by PCR 11/22/2021 NEGATIVE  NEGATIVE Final   Comment: (NOTE) The Xpert Xpress SARS-CoV-2/FLU/RSV plus assay is intended as an aid in the diagnosis of influenza from Nasopharyngeal swab specimens and should not be used as a sole basis for treatment. Nasal washings and aspirates are unacceptable for Xpert Xpress SARS-CoV-2/FLU/RSV testing.  Fact Sheet for Patients: EntrepreneurPulse.com.au  Fact Sheet for Healthcare Providers: IncredibleEmployment.be  This test is not yet approved or cleared by the [place] FDA and has been authorized for detection and/or diagnosis of SARS-CoV-2 by FDA under an Emergency Use Authorization (EUA). This EUA will remain in effect (meaning this test can be used) for the duration of the COVID-19 declaration under Section 564(b)(1) of the Act, 21 U.S.C. section 360bbb-3(b)(1), unless the authorization is terminated or revoked.  Performed at [facility], [place] [address], [place], [place] [address]    POC Amphetamine UR 11/22/2021 None Detected  NONE DETECTED (Cut Off Level 1000  ng/mL) Final   POC Secobarbital (BAR) 11/22/2021 None Detected  NONE DETECTED (Cut Off Level 300 ng/mL) Final   POC Buprenorphine (BUP) 11/22/2021 None Detected  NONE DETECTED (Cut Off Level 10 ng/mL) Final   POC Oxazepam (BZO) 11/22/2021 None Detected  NONE DETECTED (Cut Off Level 300 ng/mL) Final   POC Cocaine UR 11/22/2021 None Detected  NONE DETECTED (Cut Off Level 300 ng/mL) Final   POC Methamphetamine UR 11/22/2021 Positive (A)  NONE DETECTED (Cut Off Level 1000 ng/mL) Final   POC Morphine 11/22/2021 None Detected  NONE DETECTED (Cut Off Level 300 ng/mL) Final   POC Methadone UR 11/22/2021 None Detected  NONE DETECTED (Cut Off Level 300 ng/mL) Final   POC Oxycodone UR 11/22/2021 None Detected  NONE DETECTED (Cut Off Level 100 ng/mL) Final   POC Marijuana UR 11/22/2021 None Detected  NONE DETECTED (Cut Off Level 50 ng/mL) Final   SARSCOV2ONAVIRUS 2 AG 11/22/2021 NEGATIVE  NEGATIVE Final   Comment: (NOTE) SARS-CoV-2 antigen NOT DETECTED.   Negative results are presumptive.  Negative results do not preclude SARS-CoV-2 infection and should not be used as the sole basis for treatment or other patient management decisions, including infection  control decisions, particularly in the presence of clinical signs and  symptoms consistent with COVID-19, or in those who have been in contact with the virus.  Negative results must be combined with clinical observations, patient history, and epidemiological information. The expected result is Negative.  Fact Sheet for Patients: HandmadeRecipes.com.cy  Fact Sheet for Healthcare Providers: FuneralLife.at  This test is not yet approved or cleared by the [place] FDA and  has been authorized for detection and/or diagnosis of SARS-CoV-2 by FDA under an Emergency Use Authorization (EUA).  This EUA will remain in effect (meaning this test can be used) for the duration of  the COV                           ID-19 declaration under Section 564(b)(1) of the Act, 21 U.S.C. section 360bbb-3(b)(1), unless the authorization is terminated or revoked sooner.     Preg  Test, Ur 11/22/2021 NEGATIVE  NEGATIVE Final   Comment:        THE SENSITIVITY OF THIS METHODOLOGY IS >24 mIU/mL    Color, Urine 11/27/2021 AMBER (A)  YELLOW Final   BIOCHEMICALS MAY BE AFFECTED BY COLOR   APPearance 11/27/2021 CLOUDY (A)  CLEAR Final   Specific Gravity, Urine 11/27/2021 1.025  1.005 - 1.030 Final   pH 11/27/2021 5.0  5.0 - 8.0 Final   Glucose, UA 11/27/2021 NEGATIVE  NEGATIVE mg/dL Final   Hgb urine dipstick 11/27/2021 NEGATIVE  NEGATIVE Final   Bilirubin Urine 11/27/2021 NEGATIVE  NEGATIVE Final   Ketones, ur 11/27/2021 NEGATIVE  NEGATIVE mg/dL Final   Protein, ur 27/07/2374 NEGATIVE  NEGATIVE mg/dL Final   Nitrite 28/31/5176 NEGATIVE  NEGATIVE Final   Leukocytes,Ua 11/27/2021 MODERATE (A)  NEGATIVE Final   RBC / HPF 11/27/2021 0-5  0 - 5 RBC/hpf Final   WBC, UA 11/27/2021 >50 (H)  0 - 5 WBC/hpf Final   Bacteria, UA 11/27/2021 FEW (A)  NONE SEEN Final   Squamous Epithelial / LPF 11/27/2021 6-10  0 - 5 Final   Mucus 11/27/2021 PRESENT   Final   Performed at [facility], [address] [place], [place] [address]    Blood Alcohol level:  Lab Results  Component Value Date   ETH 159 (H) 05/18/2021    Metabolic Disorder Labs: No results found for: "HGBA1C", "MPG" No results found for: "PROLACTIN" No results found for: "CHOL", "TRIG", "HDL", "CHOLHDL", "VLDL", "LDLCALC"  Therapeutic Lab Levels: No results found for: "LITHIUM" No results found for: "VALPROATE" No results found for: "CBMZ"  Physical Findings   PHQ2-9    Flowsheet Row ED from 11/22/2021 in [facility]  PHQ-2 Total Score 2  PHQ-9 Total Score 3      Flowsheet Row ED from 11/22/2021 in [facility] ED from 05/18/2021 in [facility] EMERGENCY DEPARTMENT   C-SSRS RISK CATEGORY No Risk No Risk        Musculoskeletal  Strength & Muscle Tone: within normal limits Gait & Station: normal Patient leans: N/A  Psychiatric Specialty Exam  Presentation  General Appearance:  Appropriate for Environment; Casual  Eye Contact: Good  Speech: Clear and Coherent; Normal Rate  Speech Volume: Normal  Handedness: Right   Mood and Affect  Mood: Euthymic  Affect: congruent  Thought Process  Thought Processes: Coherent; Goal Directed  Descriptions of Associations:Intact  Orientation:Full (Time, Place and Person)  Thought Content:Logical; WDL  Diagnosis of Schizophrenia or Schizoaffective disorder in past: No    Hallucinations:Hallucinations: None   Ideas of Reference:None  Suicidal Thoughts:Suicidal Thoughts: No   Homicidal Thoughts:Homicidal Thoughts: No    Sensorium  Memory: Immediate Fair; Recent Fair  Judgment: Fair  Insight: Fair   Art therapist  Concentration: Good  Attention Span: Good  Recall: Good  Fund of Knowledge: Good  Language: Good   Psychomotor Activity  Psychomotor Activity: Psychomotor Activity: Normal    Assets  Assets: Communication Skills; Desire for Improvement; Resilience; Physical Health; Social Support   Sleep  Sleep: Sleep: Good     Physical Exam  Physical Exam Constitutional:      Appearance: the patient is not toxic-appearing.  Pulmonary:     Effort: Pulmonary effort is normal.  Neurological:     General: No focal deficit present.     Mental Status: the patient is alert and oriented to person, place, and time.   Review of Systems  Respiratory:  Negative for shortness of breath.   Cardiovascular:  Negative for chest pain.  Gastrointestinal:  Negative for abdominal pain, constipation, diarrhea, nausea and vomiting.  Neurological:  Negative for headaches.   Blood pressure (!) 120/90, pulse 64, temperature 98.9 F (37.2 C), temperature  source Tympanic, resp. rate 16, height 5' (1.524 m), weight 110 lb (49.9 kg), SpO2 100 %. Body mass index is 21.48 kg/m.  Treatment Plan Summary: Daily contact with patient to assess and evaluate symptoms and progress in treatment and Medication management   Status: Voluntary  Restarted Zoloft 50 mg for depression  Unable to obtain lab work even after multiple attempts by 3 different RNs.  Patient does have some history of anemia but presently denies any symptoms.  Urine pregnancy test was negative.  Dispo: The patient is working actively on finding a residential rehab.  If not she can be discharged with outpatient resources, medication management and therapy.  Can explore the possibility of [facility] IOP program.  Carlyn ReichertNick Johnnye Sandford, MD 11/27/2021 5:57 PM

## 2021-11-27 NOTE — ED Notes (Signed)
Patient A&Ox4. Denies intent to harm self/others when asked. Denies A/VH. Patient denies any physical complaints when asked. No acute distress noted. Routine safety checks conducted according to facility protocol. Encouraged patient to notify staff if thoughts of harm toward self or others arise. Patient verbalize understanding and agreement. Will continue to monitor for safety.    

## 2021-11-27 NOTE — ED Notes (Signed)
Pt on phone in no acute distress. Denies concerns or needs at present. Informed pt to notify staff with any needs or concerns. Will continue to monitor for safety.

## 2021-11-27 NOTE — ED Notes (Signed)
Pt asleep in bed. Respirations even and unlabored. Monitoring for safety. 

## 2021-11-27 NOTE — Progress Notes (Signed)
LCSW Progress Note   LCSW completed referral forms to include an additional referral [facility], inpatient referral form, and ASAM assessment form for [place].    Omelia Blackwater, MSW, [place] [facility] [phone] phone [phone] work phone

## 2021-11-27 NOTE — ED Notes (Signed)
Pt sleeping in no acute distress. RR even and unlabored. Safety maintained. 

## 2021-11-28 ENCOUNTER — Encounter (HOSPITAL_COMMUNITY): Payer: Self-pay

## 2021-11-28 DIAGNOSIS — F1010 Alcohol abuse, uncomplicated: Secondary | ICD-10-CM | POA: Diagnosis not present

## 2021-11-28 DIAGNOSIS — F32A Depression, unspecified: Secondary | ICD-10-CM | POA: Diagnosis not present

## 2021-11-28 DIAGNOSIS — Z1152 Encounter for screening for COVID-19: Secondary | ICD-10-CM | POA: Diagnosis not present

## 2021-11-28 DIAGNOSIS — F411 Generalized anxiety disorder: Secondary | ICD-10-CM | POA: Diagnosis not present

## 2021-11-28 DIAGNOSIS — F1090 Alcohol use, unspecified, uncomplicated: Secondary | ICD-10-CM | POA: Diagnosis not present

## 2021-11-28 NOTE — ED Provider Notes (Signed)
Behavioral Health Progress Note  Date and Time: 11/28/2021 6:31 PM Name: Lindsey Velazquez MRN:  [id number]  Subjective:   Lindsey Velazquez is a 31 year old female with a past psychiatric history of no major conditions.  She came to the [facility] behavioral health urgent care at the recommendation of her attorney because she has 2 previous DWI charges and an upcoming court date.  She reports that she uses alcohol on a regular basis and that this is an issue for her.  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  The patient desires residential rehab.  The patient is seen in conjunction with LCSW.  The patient has been noted to be working hard regarding disposition planning.  She desires to attend residential rehab but is unable to attend [facility] given that she has out of [facility].  She has had issues switching her Medicaid over to [facility].  Discussed with her the possibility of attending residential rehab in [place].  Patient is very interested in this.  LCSW to make referral today and follow-up with another rehab.  LCSW also to investigate CD IOP programming for the patient.  The patient reports appropriate sleep and appetite.  She denies experiencing withdrawal symptoms.  She denies experiencing suicidal or homicidal thoughts.   Diagnosis:  Final diagnoses:  Alcohol use disorder  Depressive disorder  Generalized anxiety disorder    Total Time spent with patient: 30 minutes  Past Psychiatric History: as above Past Medical History:  Past Medical History:  Diagnosis Date   Anxiety    Asthma    childhood   Depression    Eczema    HPV in female     Past Surgical History:  Procedure Laterality Date   NO PAST SURGERIES     Family History:  Family History  Problem Relation Age of Onset   Diabetes Mother    Hypertension Mother    Heart disease Mother    Thyroid disease Sister    Diabetes Sister    Asthma Father    Diabetes Maternal Aunt    Cancer Paternal Uncle     Family Psychiatric  History: per H and P Social History:  Social History   Substance and Sexual Activity  Alcohol Use Yes     Social History   Substance and Sexual Activity  Drug Use Yes   Types: Cocaine   Comment: occassionally    Social History   Socioeconomic History   Marital status: Single    Spouse name: Not on file   Number of children: Not on file   Years of education: Not on file   Highest education level: Not on file  Occupational History   Not on file  Tobacco Use   Smoking status: Former    Packs/day: 0.50    Types: Cigarettes   Smokeless tobacco: Never  Substance and Sexual Activity   Alcohol use: Yes   Drug use: Yes    Types: Cocaine    Comment: occassionally   Sexual activity: Yes    Birth control/protection: None  Other Topics Concern   Not on file  Social History Narrative   Not on file   Social Determinants of Health   Financial Resource Strain: Not on file  Food Insecurity: Not on file  Transportation Needs: Not on file  Physical Activity: Not on file  Stress: Not on file  Social Connections: Not on file   SDOH:  SDOH Screenings   Depression (PHQ2-9): Low Risk  (11/25/2021)  Tobacco Use: Medium Risk (05/18/2021)  Additional Social History:    Pain Medications: none reported Prescriptions: none reported Over the Counter: none reported History of alcohol / drug use?: Yes Longest period of sobriety (when/how long): unable to assess Negative Consequences of Use:  (none reported by patient) Withdrawal Symptoms: Agitation, Irritability Name of Substance 1: Alcohol 1 - Age of First Use: 17 1 - Amount (size/oz): Patient reports she drinks a half pint of hard liquor about 2-3 times a week. 1 - Frequency: 2-3 times a week. 1 - Duration: Patient reports she has been drinking since the age of 39, however reports an increase in use over the past 4-5 months 1 - Last Use / Amount: prior to admission 1- Route of Use: Drinking                   Sleep: Fair  Appetite:  Fair  Current Medications:  Current Facility-Administered Medications  Medication Dose Route Frequency Provider Last Rate Last Admin   acetaminophen (TYLENOL) tablet 650 mg  650 mg Oral Q6H PRN Lenard Lance, FNP       alum & mag hydroxide-simeth (MAALOX/MYLANTA) 200-200-20 MG/5ML suspension 30 mL  30 mL Oral Q4H PRN Lenard Lance, FNP       hydrOXYzine (ATARAX) tablet 25 mg  25 mg Oral TID PRN Lenard Lance, FNP   25 mg at 11/26/21 2149   magnesium hydroxide (MILK OF MAGNESIA) suspension 30 mL  30 mL Oral Daily PRN Lenard Lance, FNP       multivitamin with minerals tablet 1 tablet  1 tablet Oral Daily Lenard Lance, FNP   1 tablet at 11/28/21 1019   nicotine (NICODERM CQ - dosed in mg/24 hours) patch 14 mg  14 mg Transdermal Daily Lenard Lance, FNP   14 mg at 11/28/21 1019   nitrofurantoin (macrocrystal-monohydrate) (MACROBID) capsule 100 mg  100 mg Oral Q12H Vernard Gambles H, NP   100 mg at 11/28/21 1019   sertraline (ZOLOFT) tablet 50 mg  50 mg Oral Daily Carlyn Reichert, MD   50 mg at 11/28/21 1019   thiamine (VITAMIN B1) tablet 100 mg  100 mg Oral Daily Lenard Lance, FNP   100 mg at 11/28/21 1019   traZODone (DESYREL) tablet 50 mg  50 mg Oral QHS PRN Lenard Lance, FNP   50 mg at 11/27/21 2115   No current outpatient medications on file.    Labs  Lab Results:  Admission on 11/22/2021  Component Date Value Ref Range Status   SARS Coronavirus 2 by RT PCR 11/22/2021 NEGATIVE  NEGATIVE Final   Comment: (NOTE) SARS-CoV-2 target nucleic acids are NOT DETECTED.  The SARS-CoV-2 RNA is generally detectable in upper respiratory specimens during the acute phase of infection. The lowest concentration of SARS-CoV-2 viral copies this assay can detect is 138 copies/mL. A negative result does not preclude SARS-Cov-2 infection and should not be used as the sole basis for treatment or other patient management decisions. A negative result may occur with   improper specimen collection/handling, submission of specimen other than nasopharyngeal swab, presence of viral mutation(s) within the areas targeted by this assay, and inadequate number of viral copies(<138 copies/mL). A negative result must be combined with clinical observations, patient history, and epidemiological information. The expected result is Negative.  Fact Sheet for Patients:  BloggerCourse.com  Fact Sheet for Healthcare Providers:  SeriousBroker.it  This test is no  t yet approved or cleared by the [place] FDA and  has been authorized for detection and/or diagnosis of SARS-CoV-2 by FDA under an Emergency Use Authorization (EUA). This EUA will remain  in effect (meaning this test can be used) for the duration of the COVID-19 declaration under Section 564(b)(1) of the Act, 21 U.S.C.section 360bbb-3(b)(1), unless the authorization is terminated  or revoked sooner.       Influenza A by PCR 11/22/2021 NEGATIVE  NEGATIVE Final   Influenza B by PCR 11/22/2021 NEGATIVE  NEGATIVE Final   Comment: (NOTE) The Xpert Xpress SARS-CoV-2/FLU/RSV plus assay is intended as an aid in the diagnosis of influenza from Nasopharyngeal swab specimens and should not be used as a sole basis for treatment. Nasal washings and aspirates are unacceptable for Xpert Xpress SARS-CoV-2/FLU/RSV testing.  Fact Sheet for Patients: BloggerCourse.comhttps://www.fda.gov/media/152166/download  Fact Sheet for Healthcare Providers: SeriousBroker.ithttps://www.fda.gov/media/152162/download  This test is not yet approved or cleared by the [place] FDA and has been authorized for detection and/or diagnosis of SARS-CoV-2 by FDA under an Emergency Use Authorization (EUA). This EUA will remain in effect (meaning this test can be used) for the duration of the COVID-19 declaration under Section 564(b)(1) of the Act, 21 U.S.C. section 360bbb-3(b)(1), unless  the authorization is terminated or revoked.  Performed at [facility], [address] [place], [place] [id number]    POC Amphetamine UR 11/22/2021 None Detected  NONE DETECTED (Cut Off Level 1000 ng/mL) Final   POC Secobarbital (BAR) 11/22/2021 None Detected  NONE DETECTED (Cut Off Level 300 ng/mL) Final   POC Buprenorphine (BUP) 11/22/2021 None Detected  NONE DETECTED (Cut Off Level 10 ng/mL) Final   POC Oxazepam (BZO) 11/22/2021 None Detected  NONE DETECTED (Cut Off Level 300 ng/mL) Final   POC Cocaine UR 11/22/2021 None Detected  NONE DETECTED (Cut Off Level 300 ng/mL) Final   POC Methamphetamine UR 11/22/2021 Positive (A)  NONE DETECTED (Cut Off Level 1000 ng/mL) Final   POC Morphine 11/22/2021 None Detected  NONE DETECTED (Cut Off Level 300 ng/mL) Final   POC Methadone UR 11/22/2021 None Detected  NONE DETECTED (Cut Off Level 300 ng/mL) Final   POC Oxycodone UR 11/22/2021 None Detected  NONE DETECTED (Cut Off Level 100 ng/mL) Final   POC Marijuana UR 11/22/2021 None Detected  NONE DETECTED (Cut Off Level 50 ng/mL) Final   SARSCOV2ONAVIRUS 2 AG 11/22/2021 NEGATIVE  NEGATIVE Final   Comment: (NOTE) SARS-CoV-2 antigen NOT DETECTED.   Negative results are presumptive.  Negative results do not preclude SARS-CoV-2 infection and should not be used as the sole basis for treatment or other patient management decisions, including infection  control decisions, particularly in the presence of clinical signs and  symptoms consistent with COVID-19, or in those who have been in contact with the virus.  Negative results must be combined with clinical observations, patient history, and epidemiological information. The expected result is Negative.  Fact Sheet for Patients: https://www.jennings-kim.com/https://www.fda.gov/media/141569/download  Fact Sheet for Healthcare Providers: https://alexander-rogers.biz/https://www.fda.gov/media/141568/download  This test is not yet approved or cleared by the [place] FDA and  has been authorized for  detection and/or diagnosis of SARS-CoV-2 by FDA under an Emergency Use Authorization (EUA).  This EUA will remain in effect (meaning this test can be used) for the duration of  the COV                          ID-19 declaration under Section 564(b)(1) of the  Act, 21 U.S.C. section 360bbb-3(b)(1), unless the authorization is terminated or revoked sooner.     Preg Test, Ur 11/22/2021 NEGATIVE  NEGATIVE Final   Comment:        THE SENSITIVITY OF THIS METHODOLOGY IS >24 mIU/mL    Color, Urine 11/27/2021 AMBER (A)  YELLOW Final   BIOCHEMICALS MAY BE AFFECTED BY COLOR   APPearance 11/27/2021 CLOUDY (A)  CLEAR Final   Specific Gravity, Urine 11/27/2021 1.025  1.005 - 1.030 Final   pH 11/27/2021 5.0  5.0 - 8.0 Final   Glucose, UA 11/27/2021 NEGATIVE  NEGATIVE mg/dL Final   Hgb urine dipstick 11/27/2021 NEGATIVE  NEGATIVE Final   Bilirubin Urine 11/27/2021 NEGATIVE  NEGATIVE Final   Ketones, ur 11/27/2021 NEGATIVE  NEGATIVE mg/dL Final   Protein, ur 14/97/0263 NEGATIVE  NEGATIVE mg/dL Final   Nitrite 78/58/8502 NEGATIVE  NEGATIVE Final   Leukocytes,Ua 11/27/2021 MODERATE (A)  NEGATIVE Final   RBC / HPF 11/27/2021 0-5  0 - 5 RBC/hpf Final   WBC, UA 11/27/2021 >50 (H)  0 - 5 WBC/hpf Final   Bacteria, UA 11/27/2021 FEW (A)  NONE SEEN Final   Squamous Epithelial / LPF 11/27/2021 6-10  0 - 5 Final   Mucus 11/27/2021 PRESENT   Final   Performed at [facility], [address], [place], [place] [address]    Blood Alcohol level:  Lab Results  Component Value Date   ETH 159 (H) 05/18/2021    Metabolic Disorder Labs: No results found for: "HGBA1C", "MPG" No results found for: "PROLACTIN" No results found for: "CHOL", "TRIG", "HDL", "CHOLHDL", "VLDL", "LDLCALC"  Therapeutic Lab Levels: No results found for: "LITHIUM" No results found for: "VALPROATE" No results found for: "CBMZ"  Physical Findings   PHQ2-9    Flowsheet Row ED from 11/22/2021 in [facility]  PHQ-2 Total Score 2  PHQ-9 Total Score 3      Flowsheet Row ED from 11/22/2021 in [facility] ED from 05/18/2021 in [facility] EMERGENCY DEPARTMENT  C-SSRS RISK CATEGORY No Risk No Risk        Musculoskeletal  Strength & Muscle Tone: within normal limits Gait & Station: normal Patient leans: N/A  Psychiatric Specialty Exam  Presentation  General Appearance:  Appropriate for Environment; Casual  Eye Contact: Good  Speech: Clear and Coherent; Normal Rate  Speech Volume: Normal  Handedness: Right   Mood and Affect  Mood: Euthymic  Affect: congruent  Thought Process  Thought Processes: Coherent; Goal Directed  Descriptions of Associations:Intact  Orientation:Full (Time, Place and Person)  Thought Content:Logical; WDL  Diagnosis of Schizophrenia or Schizoaffective disorder in past: No    Hallucinations:none   Ideas of Reference:None  Suicidal Thoughts: none   Homicidal Thoughts: none    Sensorium  Memory: Immediate Fair; Recent Fair  Judgment: Fair  Insight: Fair   Art therapist  Concentration: Good  Attention Span: Good  Recall: Good  Fund of Knowledge: Good  Language: Good   Psychomotor Activity  Psychomotor Activity: normal    Assets  Assets: Communication Skills; Desire for Improvement; Resilience; Physical Health; Social Support   Sleep  Sleep:  fair     Physical Exam  Physical Exam Constitutional:      Appearance: the patient is not toxic-appearing.  Pulmonary:     Effort: Pulmonary effort is normal.  Neurological:     General: No focal deficit present.     Mental Status: the patient is  alert and oriented to person, place, and time.   Review of Systems  Respiratory:  Negative for shortness of breath.   Cardiovascular:  Negative for chest pain.  Gastrointestinal:  Negative for abdominal pain, constipation, diarrhea, nausea and  vomiting.  Neurological:  Negative for headaches.   Blood pressure 102/63, pulse 65, temperature 98.9 F (37.2 C), temperature source Oral, resp. rate 17, height 5' (1.524 m), weight 110 lb (49.9 kg), SpO2 100 %. Body mass index is 21.48 kg/m.  Treatment Plan Summary: Daily contact with patient to assess and evaluate symptoms and progress in treatment and Medication management   Status: Voluntary  Restarted Zoloft 50 mg for depression  Unable to obtain lab work even after multiple attempts by 3 different RNs.  Patient does have some history of anemia but presently denies any symptoms.  Urine pregnancy test was negative.  Dispo:  The patient is unable to attend [facility] residential LCSW investigating other rehab options LCSW also investigating CD IOP programming if the patient needs to be discharged without residential rehab.  Carlyn Reichert, MD 11/28/2021 6:31 PM

## 2021-11-28 NOTE — ED Notes (Signed)
Pt is in the bed sleeping. Respirations are even and unlabored. No acute distress noted. Will continue to monitor for safety. 

## 2021-11-28 NOTE — ED Notes (Signed)
Patient awake for breakfast without distress or complaint.  Patient is calm, cooperative and pleasant.  Able to make needs known.  Will monitor and provide support as needed.

## 2021-11-28 NOTE — BH IP Treatment Plan (Signed)
Interdisciplinary Treatment and Diagnostic Plan Update  11/28/2021 Time of Session: [place] MRN: [id number]  Diagnosis:  Final diagnoses:  Alcohol use disorder  Depressive disorder  Generalized anxiety disorder     Current Medications:  Current Facility-Administered Medications  Medication Dose Route Frequency Provider Last Rate Last Admin   acetaminophen (TYLENOL) tablet 650 mg  650 mg Oral Q6H PRN Lucky Rathke, FNP       alum & mag hydroxide-simeth (MAALOX/MYLANTA) 200-200-20 MG/5ML suspension 30 mL  30 mL Oral Q4H PRN Lucky Rathke, FNP       hydrOXYzine (ATARAX) tablet 25 mg  25 mg Oral TID PRN Lucky Rathke, FNP   25 mg at 11/26/21 2149   magnesium hydroxide (MILK OF MAGNESIA) suspension 30 mL  30 mL Oral Daily PRN Lucky Rathke, FNP       multivitamin with minerals tablet 1 tablet  1 tablet Oral Daily Lucky Rathke, FNP   1 tablet at 11/27/21 0949   nicotine (NICODERM CQ - dosed in mg/24 hours) patch 14 mg  14 mg Transdermal Daily Lucky Rathke, FNP   14 mg at 11/27/21 0950   nitrofurantoin (macrocrystal-monohydrate) (MACROBID) capsule 100 mg  100 mg Oral Q12H Thomes Lolling H, NP   100 mg at 11/27/21 2115   sertraline (ZOLOFT) tablet 50 mg  50 mg Oral Daily Corky Sox, MD   50 mg at 11/27/21 0949   thiamine (VITAMIN B1) tablet 100 mg  100 mg Oral Daily Lucky Rathke, FNP   100 mg at 11/27/21 0949   traZODone (DESYREL) tablet 50 mg  50 mg Oral QHS PRN Lucky Rathke, FNP   50 mg at 11/27/21 2115   No current outpatient medications on file.   PTA Medications: Prior to Admission medications   Not on File    Patient Stressors: Financial difficulties   Legal issue   Loss of housing 4-5 months ago.   Marital or family conflict   Occupational concerns   Substance abuse    Patient Strengths: Ability for insight  Average or above average intelligence  Capable of independent living  Communication skills  General fund of knowledge  Motivation for  treatment/growth  Religious Affiliation  Work skills   Treatment Modalities: Medication Management, Group therapy, Case management,  1 to 1 session with clinician, Psychoeducation, Recreational therapy.   Physician Treatment Plan for Primary and Secondary Diagnosis:  Final diagnoses:  Alcohol use disorder  Depressive disorder  Generalized anxiety disorder   Long Term Goal(s): Improvement in symptoms so as ready for discharge  Short Term Goals: Patient will verbalize feelings in meetings with treatment team members. Patient will attend at least of 50% of the groups daily. Pt will complete the PHQ9 on admission, day 3 and discharge. Patient will participate in completing the [place] Patient will score a low risk of violence for 24 hours prior to discharge Patient will take medications as prescribed daily.  Medication Management: Evaluate patient's response, side effects, and tolerance of medication regimen.  Therapeutic Interventions: 1 to 1 sessions, Unit Group sessions and Medication administration.  Evaluation of Outcomes: Progressing  LCSW Treatment Plan for Primary Diagnosis:  Final diagnoses:  Alcohol use disorder  Depressive disorder  Generalized anxiety disorder    Long Term Goal(s): Safe transition to appropriate next level of care at discharge.  Short Term Goals: Facilitate acceptance of mental health diagnosis and concerns through verbal commitment to aftercare plan and appointments at  discharge., Patient will identify one social support prior to discharge to aid in patient's recovery., Patient will attend AA/NA groups as scheduled., Identify minimum of 2 triggers associated with mental health/substance abuse issues with treatment team members., and Increase skills for wellness and recovery by attending 50% of scheduled groups.  Therapeutic Interventions: Assess for all discharge needs, 1 to 1 time with Child psychotherapist, Explore available  resources and support systems, Assess for adequacy in community support network, Educate family and significant other(s) on suicide prevention, Complete Psychosocial Assessment, Interpersonal group therapy.  Evaluation of Outcomes: Progressing   Progress in Treatment: Attending groups: Yes. Participating in groups: Yes. Taking medication as prescribed: Yes. Toleration medication: Yes. Family/Significant other contact made: Yes, individual(s) contacted:  Children's paternal aunt is aware that she is here on the unit. Patient understands diagnosis: Yes. Discussing patient identified problems/goals with staff: Yes. Medical problems stabilized or resolved: Yes. Denies suicidal/homicidal ideation: Yes. Issues/concerns per patient self-inventory: Concerned with getting treatment for herself and help for housing for her and her family. Other: None  New problem(s) identified: Yes, Describe:  Housing for her and her four children.  New Short Term/Long Term Goal(s): "To get my life back on track."  Patient Goals:  Go into residential treatment for substance use.  Discharge Plan or Barriers: Referral to [facility] was denied due to Medicaid being registered in [place] instead of [place].  A referral to [facility] was sent on 28 November 2021.  Reason for Continuation of Hospitalization: Medication stabilization  Estimated Length of Stay:  Last 3 [place] Suicide Severity Risk Score: Flowsheet Row ED from 11/22/2021 in [facility] ED from 05/18/2021 in [facility] EMERGENCY DEPARTMENT  C-SSRS RISK CATEGORY No Risk No Risk       Last PHQ 2/9 Scores:    11/25/2021    7:01 AM 11/22/2021   10:35 AM 11/22/2021    7:08 AM  Depression screen PHQ 2/9  Decreased Interest 1 0 1  Down, Depressed, Hopeless 1 1 1   PHQ - 2 Score 2 1 2   Altered sleeping 0  1  Tired, decreased energy 1  0  Change in appetite 0  0  Feeling bad or failure about  yourself  0  1  Trouble concentrating 0  0  Moving slowly or fidgety/restless 0  0  Suicidal thoughts 0  0  PHQ-9 Score 3  4  Difficult doing work/chores Somewhat difficult  Somewhat difficult    Scribe for Treatment Team: , LCSW 11/28/2021 10:07 AM

## 2021-11-28 NOTE — Progress Notes (Signed)
Patient attended a recovery group with RN and participated well. 

## 2021-11-28 NOTE — Progress Notes (Signed)
LCSW Progress Note   1006 - LCSW scanned referral paperwork to [facility]ary[facility] Bro[facility], [place] [facility] [place], [phone].  LCSW called to confirm if paperwork had been received but left a message for call back.  First fax sent had failed due to a busy fax line.   Omelia Blackwater, MSW, [place] [facility] [phone] phone [phone] work phone

## 2021-11-28 NOTE — ED Notes (Addendum)
Pt is in the dayroom watching TV.  Respirations are even and unlabored. No acute distress noted. Will continue to monitor for safety. 

## 2021-11-29 DIAGNOSIS — F1010 Alcohol abuse, uncomplicated: Secondary | ICD-10-CM | POA: Diagnosis not present

## 2021-11-29 DIAGNOSIS — F1090 Alcohol use, unspecified, uncomplicated: Secondary | ICD-10-CM | POA: Diagnosis not present

## 2021-11-29 DIAGNOSIS — F411 Generalized anxiety disorder: Secondary | ICD-10-CM | POA: Diagnosis not present

## 2021-11-29 DIAGNOSIS — Z1152 Encounter for screening for COVID-19: Secondary | ICD-10-CM | POA: Diagnosis not present

## 2021-11-29 DIAGNOSIS — F32A Depression, unspecified: Secondary | ICD-10-CM | POA: Diagnosis not present

## 2021-11-29 MED ORDER — NITROFURANTOIN MONOHYD MACRO 100 MG PO CAPS: 100.0000 mg | ORAL_CAPSULE | Freq: Two times a day (BID) | ORAL | 0 refills | Status: AC

## 2021-11-29 MED ORDER — NICOTINE 14 MG/24HR TD PT24: 14.0000 mg | MEDICATED_PATCH | Freq: Every day | TRANSDERMAL | 0 refills | Status: AC

## 2021-11-29 MED ORDER — SERTRALINE HCL 50 MG PO TABS: 50.0000 mg | ORAL_TABLET | Freq: Every day | ORAL | 0 refills | Status: AC

## 2021-11-29 NOTE — ED Provider Notes (Signed)
Behavioral Health Progress Note  Date and Time: 11/29/2021 6:10 PM Name: Lindsey Velazquez MRN:  [id number]  Subjective:   Lindsey Velazquez is a 31 year old female with a past psychiatric history of no major conditions.  She came to the [facility] behavioral health urgent care at the recommendation of her attorney because she has 2 previous DWI charges and an upcoming court date.  She reports that she uses alcohol on a regular basis and that this is an issue for her.  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  The patient desires residential rehab.  The patient reports appropriate sleep and appetite.  She reports that her mood is good and denies experiencing any suicidal or homicidal thoughts.  He has found out later in the day that the patient has been accepted at a residential rehab in [place].  The patient will have to leave by 11 AM tomorrow in order to arrive at the rehab by 1 PM.  Printed scripts still need to be signed.  Request for samples has been made.   Diagnosis:  Final diagnoses:  Alcohol use disorder  Depressive disorder  Generalized anxiety disorder    Total Time spent with patient: 30 minutes  Past Psychiatric History: as above Past Medical History:  Past Medical History:  Diagnosis Date   Anxiety    Asthma    childhood   Depression    Eczema    HPV in female     Past Surgical History:  Procedure Laterality Date   NO PAST SURGERIES     Family History:  Family History  Problem Relation Age of Onset   Diabetes Mother    Hypertension Mother    Heart disease Mother    Thyroid disease Sister    Diabetes Sister    Asthma Father    Diabetes Maternal Aunt    Cancer Paternal Uncle    Family Psychiatric  History: per H and P Social History:  Social History   Substance and Sexual Activity  Alcohol Use Yes     Social History   Substance and Sexual Activity  Drug Use Yes   Types: Cocaine   Comment: occassionally    Social History   Socioeconomic  History   Marital status: Single    Spouse name: Not on file   Number of children: Not on file   Years of education: Not on file   Highest education level: Not on file  Occupational History   Not on file  Tobacco Use   Smoking status: Former    Packs/day: 0.50    Types: Cigarettes   Smokeless tobacco: Never  Substance and Sexual Activity   Alcohol use: Yes   Drug use: Yes    Types: Cocaine    Comment: occassionally   Sexual activity: Yes    Birth control/protection: None  Other Topics Concern   Not on file  Social History Narrative   Not on file   Social Determinants of Health   Financial Resource Strain: Not on file  Food Insecurity: Not on file  Transportation Needs: Not on file  Physical Activity: Not on file  Stress: Not on file  Social Connections: Not on file   SDOH:  SDOH Screenings   Depression (PHQ2-9): Low Risk  (11/25/2021)  Tobacco Use: Medium Risk (05/18/2021)   Additional Social History:    Pain Medications: none reported Prescriptions: none reported Over the Counter: none reported History of alcohol / drug use?: Yes Longest period of sobriety (when/how long): unable to assess Negative  Consequences of Use:  (none reported by patient) Withdrawal Symptoms: Agitation, Irritability Name of Substance 1: Alcohol 1 - Age of First Use: 17 1 - Amount (size/oz): Patient reports she drinks a half pint of hard liquor about 2-3 times a week. 1 - Frequency: 2-3 times a week. 1 - Duration: Patient reports she has been drinking since the age of 31, however reports an increase in use over the past 4-5 months 1 - Last Use / Amount: prior to admission 1- Route of Use: Drinking                  Sleep: Fair  Appetite:  Fair  Current Medications:  Current Facility-Administered Medications  Medication Dose Route Frequency Provider Last Rate Last Admin   acetaminophen (TYLENOL) tablet 650 mg  650 mg Oral Q6H PRN Lenard LanceAllen, Tina L, FNP       alum & mag  hydroxide-simeth (MAALOX/MYLANTA) 200-200-20 MG/5ML suspension 30 mL  30 mL Oral Q4H PRN Lenard LanceAllen, Tina L, FNP       hydrOXYzine (ATARAX) tablet 25 mg  25 mg Oral TID PRN Lenard LanceAllen, Tina L, FNP   25 mg at 11/26/21 2149   magnesium hydroxide (MILK OF MAGNESIA) suspension 30 mL  30 mL Oral Daily PRN Lenard LanceAllen, Tina L, FNP       multivitamin with minerals tablet 1 tablet  1 tablet Oral Daily Lenard LanceAllen, Tina L, FNP   1 tablet at 11/29/21 0912   nicotine (NICODERM CQ - dosed in mg/24 hours) patch 14 mg  14 mg Transdermal Daily Lenard LanceAllen, Tina L, FNP   14 mg at 11/29/21 0912   nitrofurantoin (macrocrystal-monohydrate) (MACROBID) capsule 100 mg  100 mg Oral Q12H Vernard Gamblesoleman, Carolyn H, NP   100 mg at 11/29/21 0912   sertraline (ZOLOFT) tablet 50 mg  50 mg Oral Daily Carlyn ReichertGabrielle, Samiel Peel, MD   50 mg at 11/29/21 0912   thiamine (VITAMIN B1) tablet 100 mg  100 mg Oral Daily Lenard LanceAllen, Tina L, FNP   100 mg at 11/29/21 0912   traZODone (DESYREL) tablet 50 mg  50 mg Oral QHS PRN Lenard LanceAllen, Tina L, FNP   50 mg at 11/28/21 2109   Current Outpatient Medications  Medication Sig Dispense Refill   nicotine (NICODERM CQ - DOSED IN MG/24 HOURS) 14 mg/24hr patch Place 1 patch (14 mg total) onto the skin daily. 28 patch 0   nitrofurantoin, macrocrystal-monohydrate, (MACROBID) 100 MG capsule Take 1 capsule (100 mg total) by mouth every 12 (twelve) hours for 5 days. 10 capsule 0   sertraline (ZOLOFT) 50 MG tablet Take 1 tablet (50 mg total) by mouth daily. 30 tablet 0    Labs  Lab Results:  Admission on 11/22/2021  Component Date Value Ref Range Status   SARS Coronavirus 2 by RT PCR 11/22/2021 NEGATIVE  NEGATIVE Final   Comment: (NOTE) SARS-CoV-2 target nucleic acids are NOT DETECTED.  The SARS-CoV-2 RNA is generally detectable in upper respiratory specimens during the acute phase of infection. The lowest concentration of SARS-CoV-2 viral copies this assay can detect is 138 copies/mL. A negative result does not preclude SARS-Cov-2 infection and  should not be used as the sole basis for treatment or other patient management decisions. A negative result may occur with  improper specimen collection/handling, submission of specimen other than nasopharyngeal swab, presence of viral mutation(s) within the areas targeted by this assay, and inadequate number of viral copies(<138 copies/mL). A negative result must be combined with clinical observations, patient history, and epidemiological information.  The expected result is Negative.  Fact Sheet for Patients:  BloggerCourse.com  Fact Sheet for Healthcare Providers:  SeriousBroker.it  This test is no                          t yet approved or cleared by the [place] FDA and  has been authorized for detection and/or diagnosis of SARS-CoV-2 by FDA under an Emergency Use Authorization (EUA). This EUA will remain  in effect (meaning this test can be used) for the duration of the COVID-19 declaration under Section 564(b)(1) of the Act, 21 U.S.C.section 360bbb-3(b)(1), unless the authorization is terminated  or revoked sooner.       Influenza A by PCR 11/22/2021 NEGATIVE  NEGATIVE Final   Influenza B by PCR 11/22/2021 NEGATIVE  NEGATIVE Final   Comment: (NOTE) The Xpert Xpress SARS-CoV-2/FLU/RSV plus assay is intended as an aid in the diagnosis of influenza from Nasopharyngeal swab specimens and should not be used as a sole basis for treatment. Nasal washings and aspirates are unacceptable for Xpert Xpress SARS-CoV-2/FLU/RSV testing.  Fact Sheet for Patients: BloggerCourse.com  Fact Sheet for Healthcare Providers: SeriousBroker.it  This test is not yet approved or cleared by the [place] FDA and has been authorized for detection and/or diagnosis of SARS-CoV-2 by FDA under an Emergency Use Authorization (EUA). This EUA will remain in effect (meaning this test can be used)  for the duration of the COVID-19 declaration under Section 564(b)(1) of the Act, 21 U.S.C. section 360bbb-3(b)(1), unless the authorization is terminated or revoked.  Performed at [facility], [address] [place], [place] [address]    POC Amphetamine UR 11/22/2021 None Detected  NONE DETECTED (Cut Off Level 1000 ng/mL) Final   POC Secobarbital (BAR) 11/22/2021 None Detected  NONE DETECTED (Cut Off Level 300 ng/mL) Final   POC Buprenorphine (BUP) 11/22/2021 None Detected  NONE DETECTED (Cut Off Level 10 ng/mL) Final   POC Oxazepam (BZO) 11/22/2021 None Detected  NONE DETECTED (Cut Off Level 300 ng/mL) Final   POC Cocaine UR 11/22/2021 None Detected  NONE DETECTED (Cut Off Level 300 ng/mL) Final   POC Methamphetamine UR 11/22/2021 Positive (A)  NONE DETECTED (Cut Off Level 1000 ng/mL) Final   POC Morphine 11/22/2021 None Detected  NONE DETECTED (Cut Off Level 300 ng/mL) Final   POC Methadone UR 11/22/2021 None Detected  NONE DETECTED (Cut Off Level 300 ng/mL) Final   POC Oxycodone UR 11/22/2021 None Detected  NONE DETECTED (Cut Off Level 100 ng/mL) Final   POC Marijuana UR 11/22/2021 None Detected  NONE DETECTED (Cut Off Level 50 ng/mL) Final   SARSCOV2ONAVIRUS 2 AG 11/22/2021 NEGATIVE  NEGATIVE Final   Comment: (NOTE) SARS-CoV-2 antigen NOT DETECTED.   Negative results are presumptive.  Negative results do not preclude SARS-CoV-2 infection and should not be used as the sole basis for treatment or other patient management decisions, including infection  control decisions, particularly in the presence of clinical signs and  symptoms consistent with COVID-19, or in those who have been in contact with the virus.  Negative results must be combined with clinical observations, patient history, and epidemiological information. The expected result is Negative.  Fact Sheet for Patients: https://www.jennings-kim.com/  Fact Sheet for Healthcare  Providers: https://alexander-rogers.biz/  This test is not yet approved or cleared by the [place] FDA and  has been authorized for detection and/or diagnosis of SARS-CoV-2 by FDA under an Emergency Use Authorization (EUA).  This  EUA will remain in effect (meaning this test can be used) for the duration of  the COV                          ID-19 declaration under Section 564(b)(1) of the Act, 21 U.S.C. section 360bbb-3(b)(1), unless the authorization is terminated or revoked sooner.     Preg Test, Ur 11/22/2021 NEGATIVE  NEGATIVE Final   Comment:        THE SENSITIVITY OF THIS METHODOLOGY IS >24 mIU/mL    Color, Urine 11/27/2021 AMBER (A)  YELLOW Final   BIOCHEMICALS MAY BE AFFECTED BY COLOR   APPearance 11/27/2021 CLOUDY (A)  CLEAR Final   Specific Gravity, Urine 11/27/2021 1.025  1.005 - 1.030 Final   pH 11/27/2021 5.0  5.0 - 8.0 Final   Glucose, UA 11/27/2021 NEGATIVE  NEGATIVE mg/dL Final   Hgb urine dipstick 11/27/2021 NEGATIVE  NEGATIVE Final   Bilirubin Urine 11/27/2021 NEGATIVE  NEGATIVE Final   Ketones, ur 11/27/2021 NEGATIVE  NEGATIVE mg/dL Final   Protein, ur 11/27/2021 NEGATIVE  NEGATIVE mg/dL Final   Nitrite 11/27/2021 NEGATIVE  NEGATIVE Final   Leukocytes,Ua 11/27/2021 MODERATE (A)  NEGATIVE Final   RBC / HPF 11/27/2021 0-5  0 - 5 RBC/hpf Final   WBC, UA 11/27/2021 >50 (H)  0 - 5 WBC/hpf Final   Bacteria, UA 11/27/2021 FEW (A)  NONE SEEN Final   Squamous Epithelial / LPF 11/27/2021 6-10  0 - 5 Final   Mucus 11/27/2021 PRESENT   Final   Performed at [facility], [place] [address] [place], [place] [address]    Blood Alcohol level:  Lab Results  Component Value Date   ETH 159 (H) 99/24/2683    Metabolic Disorder Labs: No results found for: "HGBA1C", "MPG" No results found for: "PROLACTIN" No results found for: "CHOL", "TRIG", "HDL", "CHOLHDL", "VLDL", "[place]"  Therapeutic Lab Levels: No results found for: "LITHIUM" No results found  for: "VALPROATE" No results found for: "CBMZ"  Physical Findings   PHQ2-9    [place] [facility] from 11/22/2021 in [facility]  PHQ-2 Total Score 2  PHQ-9 Total Score 3      [place] [facility] from 11/22/2021 in [facility] ED from 05/18/2021 in [place] No Risk No Risk        Musculoskeletal  Strength & Muscle Tone: within normal limits Gait & Station: normal Patient leans: N/A  Psychiatric Specialty Exam  Presentation  General Appearance:  Appropriate for Environment; Casual  Eye Contact: Good  Speech: Clear and Coherent; Normal Rate  Speech Volume: Normal  Handedness: Right   Mood and Affect  Mood: Euthymic  Affect: congruent  Thought Process  Thought Processes: Coherent; Goal Directed  Descriptions of Associations:Intact  Orientation:Full (Time, Place and Person)  Thought Content:Logical; WDL  Diagnosis of Schizophrenia or Schizoaffective disorder in past: No    Hallucinations:none   Ideas of Reference:None  Suicidal Thoughts: none   Homicidal Thoughts: none    Sensorium  Memory: Immediate Fair; Recent Fair  Judgment: Fair  Insight: Fair   Community education officer  Concentration: Good  Attention Span: Good  Recall: Good  Fund of Knowledge: Good  Language: Good   Psychomotor Activity  Psychomotor Activity: normal    Assets  Assets: Communication Skills; Desire for Improvement; Resilience; Physical Health; Social Support   Sleep  Sleep:  fair  Physical Exam  Physical Exam Constitutional:      Appearance: the patient is not toxic-appearing.  Pulmonary:     Effort: Pulmonary effort is normal.  Neurological:     General: No focal deficit present.     Mental Status: the patient is alert and oriented to person, place, and time.   Review of Systems  Respiratory:  Negative for  shortness of breath.   Cardiovascular:  Negative for chest pain.  Gastrointestinal:  Negative for abdominal pain, constipation, diarrhea, nausea and vomiting.  Neurological:  Negative for headaches.   Blood pressure 99/65, pulse 67, temperature 98.7 F (37.1 C), temperature source Tympanic, resp. rate 16, height 5' (1.524 m), weight 110 lb (49.9 kg), SpO2 100 %. Body mass index is 21.48 kg/m.  Treatment Plan Summary: Daily contact with patient to assess and evaluate symptoms and progress in treatment and Medication management   Status: Voluntary  Restarted Zoloft 50 mg for depression  Unable to obtain lab work even after multiple attempts by 3 different RNs.  Patient does have some history of anemia but presently denies any symptoms.  Urine pregnancy test was negative.  Dispo:  Accepted to residential rehab in [place], MD 11/29/2021 6:10 PM

## 2021-11-29 NOTE — ED Notes (Signed)
Pt is in the bed sleeping. Respirations are even and unlabored. No acute distress noted. Will continue to monitor for safety. 

## 2021-11-29 NOTE — Discharge Instructions (Signed)
SUBSTANCE USE TREATMENT for Medicaid and [place]  [facility] [place], [place], [address] [phone] phone NOTE: [facility] is no longer offering IOP services.  Serves those who are low-income or have no insurance.  [facility] [address], [place], [place], [address] [phone] phone [phone] fax NOTE: Does have Substance Abuse-Intensive Outpatient Program [facility]) as well as transitional housing if eligible.  [place], [place], [address] [phone] phone [phone] fax  [facility] [phone] [place], [place], [address] [phone] phone [phone] fax  [facility] (women only at this campus) [place], [place] [address] [phone]  ((These programs listed above have a one-time application fee.))  [facility] [address] [place] [address] [phone]  [facility] [address] [place] [address] [phone] II Ch[facility] Busman [address], [place] [address] [phone], [place] [address] [phone]  HALFWAY HOUSES:  [facility] [phone]  [employer].oxfordvacancies.com  12 STEP PROGRAMS:  Alcoholics Anonymous of [place] ReportZoo.com.cy Al-Anon of [employer], [place] www.[place]alanon.org/find-meetings.Naval architect [facility] [place], [place] [address] [place] [address] [place], [place] [address] [phone] Population served: Adult men & women (32 years old and older, able to perform activities for daily living) Documents required: Valid ID & [place] [address] [place], [place]  [address] [phone] Population served: Families with Chief Technology Officer of [employer] [place], [place], [place] [address] [phone] Population Served: Families with children, adult women, and adult men.  [place] H[facility]. [facility]) - Emergency Family Shel[facility] [employer], [place], [place] [address] [phone] or [phone] Population served: Families with children.

## 2021-11-29 NOTE — Progress Notes (Signed)
LCSW went and spoke with patient this morning regarding discharge plans. Patient was informed that LCSW was able to secure an intensive outpatient substance abuse appt with [facility] for 12/04/2021 at 1:00pm for follow up plan if residential placement is not secured prior to discharge. Patient reported that she was awaiting a call back from [facility] [phone] regarding facility acceptance. MD pending medical review at this time. Patient aware that facility may not be able to hold her until placement is secured, however team would work diligently to arrange appropriate resources as able. Patient expressed understanding. Patient reports she will provide updates as received.   Patient was also informed of the various resources provided in her after visit summary for local shelters, substance abuse programs for outpatient and residential placement, and AA meetings. Patient expressed understanding and stated she is hopeful to secure placement with [facility], however understands how to follow up as needed if placement is not secured. LCSW provided brief supportive counseling the patient and she was receptive to the feedback provided. No other needs were reported by the patient at this time.   LCSW will continue to follow and provide updates as received.    Lucius Conn, [place] Social Worker [place] Ph: [phone]

## 2021-11-29 NOTE — ED Notes (Signed)
Patient observed/assessed in dayroom. Alert and oriented x 4 with a blunted affect. Denies A/V/H, Denies thoughts/plan of self harm. Patient verbalized complaint of trouble sleeping/resting post hospital visit. Denied complaints of pain/anxiety. Patient offered dinner and appetite good. Will continue to monitor/support.

## 2021-11-29 NOTE — ED Notes (Signed)
Patient is awake and alert.  Ate breakfast this morning and is without distress or complaint.  She is focused on discharge later today.  The details of discharge are still to be sokidified.  She is working with Education officer, museum to establish the best discharge plan.  Encouraged to make needs known.  Will monitor.

## 2021-11-29 NOTE — ED Notes (Signed)
Unable to collect ethanol level at this time.

## 2021-11-29 NOTE — [facility] Group Notes (Addendum)
Problem Solving & Overcoming Obstacles  Tuesday - Problem Solving & Overcoming Obstacles   Date: 11/29/21  Type of Therapy/Therapeutic Modalities: Group, CBT, Motivational Interviewing, Solution-Focused   Participation Level: Active  Objective - In this group patients will be encouraged to explore what they see as problems/obstacles to their own wellness and recovery. Participants will watch a short motivational video clip about overcoming obstacles. They will be guided to discuss their thoughts, feelings, and behaviors related to these obstacles. The group will process together ways to cope with barriers, with attention given to specific choices patients can make. Each patient will be challenged to identify changes they are motivated to make to overcome their obstacles. This group will be process-oriented, with patients participating in exploration of their own experiences as well as giving and receiving support and challenge from other group members.  Therapeutic Goals:  Patient will identify personal and current obstacles as they relate to admission. Patient will identify barriers that currently interfere with their wellness or overcoming obstacles.  Patient will identify feelings, thought process and behaviors related to these barriers. Patient will identify two changes they are willing to make to overcome these obstacles:   Summary of Patient Progress: Patient actively participated in group on today. Patient was able to define the different ways she could change the way she views her current circumstance. Patient reports watching the video clip made her grateful for the challenges she has faced, and has put in better perspective the meaning of humility. Patient reports feeling humble and grateful at this time for where she is currently in her life. Patient reports she believes that staff truly care about her wellbeing and expressed gratitude for the assistance provided. Patient interacted  positively with LCSW and her peers. Patient was also receptive of feedback provided by LCSW.  Lucius Conn, LCSW Clinical Social Worker [place] [facility] Ph: [phone]

## 2021-11-30 DIAGNOSIS — F411 Generalized anxiety disorder: Secondary | ICD-10-CM | POA: Diagnosis not present

## 2021-11-30 DIAGNOSIS — F1010 Alcohol abuse, uncomplicated: Secondary | ICD-10-CM | POA: Diagnosis not present

## 2021-11-30 DIAGNOSIS — F1090 Alcohol use, unspecified, uncomplicated: Secondary | ICD-10-CM | POA: Diagnosis not present

## 2021-11-30 DIAGNOSIS — F32A Depression, unspecified: Secondary | ICD-10-CM | POA: Diagnosis not present

## 2021-11-30 DIAGNOSIS — Z1152 Encounter for screening for COVID-19: Secondary | ICD-10-CM | POA: Diagnosis not present

## 2021-11-30 NOTE — ED Notes (Signed)
Patient A&O x 4, ambulatory. Patient discharged in no acute distress. Patient denied SI/HI, A/VH upon discharge. Patient verbalized understanding of all discharge instructions explained by staff, to include follow up appointments, RX's and safety plan. Patient reported mood 10/10.  Pt belongings returned to patient from locker intact. Patient escorted to lobby via staff for transport to [facility] in [place], [place] via safe transport. Safety maintained.

## 2021-11-30 NOTE — ED Notes (Signed)
Patient observed/assessed in room in bed appearing in no immediate distress resting peacefully. Q15 minute checks continued by MHT and nursing staff. Will continue to monitor and support. 

## 2021-11-30 NOTE — ED Notes (Signed)
[facility] called for transportation services to [facility] in [place]. Pt accepted scheduled meds w/o difficulty. Breakfast provided and clean clothes returned to Pt. Pt has been on the phone and in the dayroom conversing to with other patients this morning. Safety maintained and will continue to monitor.

## 2021-11-30 NOTE — Discharge Planning (Signed)
LCSW received update from [facility] [place] [phone] in [place], [place] on yesterday 10/18 that patient has been accepted into their residential program. Per Tillie Rung, patient will need to arrive at their facility by 1:00pm. 14 day supply of medication should be provided for the patient as well. [facility] will need to arrange transportation for the patient to [place]. No other needs were reported.   Update was provided to the patient on yesterday, who expressed great gratitude and appreciation of the staffs assistance with locating placement for her. Patient reports being very hopeful for change. Patient aware of discharge on today 10/19. No other concerns were reported.   LCSW, MD, and RN spoke regarding transition. Transportation will be arranged via [employer] [phone] for patient to arrive in [place], [place] by 1:00pm. No other needs were reported at this time.   LCSW will sign off at this time. Please inform if further SW needs arise prior to discharge.   Lucius Conn, LCSW Clinical Social Worker [place] [facility] Ph: [phone]

## 2021-12-04 ENCOUNTER — Ambulatory Visit (HOSPITAL_COMMUNITY): Payer: No Typology Code available for payment source | Admitting: Licensed Clinical Social Worker
